# Patient Record
Sex: Female | Born: 1950
Health system: Southern US, Community
[De-identification: ages and names within clinical notes are randomized; demographics above are authoritative.]

## PROBLEM LIST (undated history)

## (undated) DIAGNOSIS — M8580 Other specified disorders of bone density and structure, unspecified site: Secondary | ICD-10-CM

## (undated) DIAGNOSIS — M1990 Unspecified osteoarthritis, unspecified site: Secondary | ICD-10-CM

## (undated) DIAGNOSIS — T7840XA Allergy, unspecified, initial encounter: Secondary | ICD-10-CM

## (undated) DIAGNOSIS — H409 Unspecified glaucoma: Secondary | ICD-10-CM

## (undated) DIAGNOSIS — F329 Major depressive disorder, single episode, unspecified: Secondary | ICD-10-CM

## (undated) DIAGNOSIS — M26629 Arthralgia of temporomandibular joint, unspecified side: Secondary | ICD-10-CM

## (undated) DIAGNOSIS — Z8049 Family history of malignant neoplasm of other genital organs: Secondary | ICD-10-CM

## (undated) DIAGNOSIS — U071 COVID-19: Secondary | ICD-10-CM

## (undated) DIAGNOSIS — I10 Essential (primary) hypertension: Secondary | ICD-10-CM

## (undated) DIAGNOSIS — F32A Depression, unspecified: Secondary | ICD-10-CM

## (undated) DIAGNOSIS — E785 Hyperlipidemia, unspecified: Secondary | ICD-10-CM

## (undated) DIAGNOSIS — Z800 Family history of malignant neoplasm of digestive organs: Secondary | ICD-10-CM

## (undated) DIAGNOSIS — Z801 Family history of malignant neoplasm of trachea, bronchus and lung: Secondary | ICD-10-CM

## (undated) HISTORY — DX: Allergy, unspecified, initial encounter: T78.40XA

## (undated) HISTORY — DX: Arthralgia of temporomandibular joint, unspecified side: M26.629

## (undated) HISTORY — DX: Family history of malignant neoplasm of other genital organs: Z80.49

## (undated) HISTORY — DX: Unspecified glaucoma: H40.9

## (undated) HISTORY — DX: Other specified disorders of bone density and structure, unspecified site: M85.80

## (undated) HISTORY — DX: Family history of malignant neoplasm of digestive organs: Z80.0

## (undated) HISTORY — PX: KNEE ARTHROSCOPY: SUR90

## (undated) HISTORY — DX: Essential (primary) hypertension: I10

## (undated) HISTORY — DX: Major depressive disorder, single episode, unspecified: F32.9

## (undated) HISTORY — PX: BIOPSY BREAST: PRO8

## (undated) HISTORY — DX: Hyperlipidemia, unspecified: E78.5

## (undated) HISTORY — PX: TONSILLECTOMY: SUR1361

## (undated) HISTORY — DX: Unspecified osteoarthritis, unspecified site: M19.90

## (undated) HISTORY — PX: TEMPOROMANDIBULAR JOINT SURGERY: SHX35

## (undated) HISTORY — DX: Depression, unspecified: F32.A

## (undated) HISTORY — DX: Family history of malignant neoplasm of trachea, bronchus and lung: Z80.1

## (undated) HISTORY — PX: BLEPHAROPLASTY: SUR158

## (undated) HISTORY — DX: COVID-19: U07.1

---

## 1999-08-08 ENCOUNTER — Other Ambulatory Visit: Admission: RE | Admit: 1999-08-08 | Discharge: 1999-08-08 | Payer: Self-pay | Admitting: Obstetrics and Gynecology

## 2000-04-02 ENCOUNTER — Encounter: Payer: Self-pay | Admitting: Neurological Surgery

## 2000-04-02 ENCOUNTER — Encounter: Admission: RE | Admit: 2000-04-02 | Discharge: 2000-04-02 | Payer: Self-pay | Admitting: Neurological Surgery

## 2000-09-03 ENCOUNTER — Other Ambulatory Visit: Admission: RE | Admit: 2000-09-03 | Discharge: 2000-09-03 | Payer: Self-pay | Admitting: Obstetrics and Gynecology

## 2001-09-07 ENCOUNTER — Other Ambulatory Visit: Admission: RE | Admit: 2001-09-07 | Discharge: 2001-09-07 | Payer: Self-pay | Admitting: Obstetrics and Gynecology

## 2002-09-13 ENCOUNTER — Other Ambulatory Visit: Admission: RE | Admit: 2002-09-13 | Discharge: 2002-09-13 | Payer: Self-pay | Admitting: Obstetrics and Gynecology

## 2003-08-01 ENCOUNTER — Encounter: Payer: Self-pay | Admitting: Oral & Maxillofacial Surgery

## 2003-08-03 ENCOUNTER — Observation Stay (HOSPITAL_COMMUNITY): Admission: RE | Admit: 2003-08-03 | Discharge: 2003-08-04 | Payer: Self-pay | Admitting: Unknown Physician Specialty

## 2003-11-30 ENCOUNTER — Other Ambulatory Visit: Admission: RE | Admit: 2003-11-30 | Discharge: 2003-11-30 | Payer: Self-pay | Admitting: Obstetrics and Gynecology

## 2004-12-04 ENCOUNTER — Other Ambulatory Visit: Admission: RE | Admit: 2004-12-04 | Discharge: 2004-12-04 | Payer: Self-pay | Admitting: Obstetrics and Gynecology

## 2005-12-09 ENCOUNTER — Other Ambulatory Visit: Admission: RE | Admit: 2005-12-09 | Discharge: 2005-12-09 | Payer: Self-pay | Admitting: Obstetrics and Gynecology

## 2007-03-15 ENCOUNTER — Encounter: Admission: RE | Admit: 2007-03-15 | Discharge: 2007-03-15 | Payer: Self-pay | Admitting: Orthopedic Surgery

## 2007-10-08 ENCOUNTER — Ambulatory Visit: Payer: Self-pay | Admitting: Family Medicine

## 2008-05-01 ENCOUNTER — Ambulatory Visit: Payer: Self-pay | Admitting: Family Medicine

## 2008-05-01 ENCOUNTER — Encounter: Admission: RE | Admit: 2008-05-01 | Discharge: 2008-05-01 | Payer: Self-pay | Admitting: Family Medicine

## 2008-09-22 LAB — HM COLONOSCOPY: HM Colonoscopy: NORMAL

## 2008-10-04 ENCOUNTER — Ambulatory Visit: Payer: Self-pay | Admitting: Family Medicine

## 2009-01-01 LAB — HM DEXA SCAN: HM Dexa Scan: NORMAL

## 2010-08-27 ENCOUNTER — Ambulatory Visit: Payer: Self-pay | Admitting: Family Medicine

## 2010-11-06 ENCOUNTER — Ambulatory Visit: Payer: Self-pay | Admitting: Family Medicine

## 2011-04-04 NOTE — Op Note (Signed)
NAME:  Jodi Duffy, Jodi Duffy                       ACCOUNT NO.:  [id number]   MEDICAL RECORD NO.:  [id number]                   PATIENT TYPE:  OBV   LOCATION:  0445                                 FACILITY:  [facility]   PHYSICIAN:  Dorthula Matas, D.D.S.           DATE OF BIRTH:  [birth year]   DATE OF PROCEDURE:  08/03/2003  DATE OF DISCHARGE:  08/04/2003                                 OPERATIVE REPORT   This is a redictation of her operative note.   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   OPERATION:  Bilateral mandibular sagittal split osteotomies with advancement  of the mandible and rigid interosseous fixation and mandibular horizontal  sliding reduction symphyseal osteotomy with rigid fixation.   SURGEON:  Dorthula Matas, D.D.S. and Gwendlyn Deutscher, D.D.S.   ESTIMATED BLOOD LOSS:  300 mL   DESCRIPTION OF PROCEDURE:  The patient was brought to the OR, placed on the  OR table in a supine position. She was then placed under general anesthesia  and a nasal endotracheal tube was inserted. The patient was maintained under  general anesthesia and the nasogastric tube was also inserted. The patient  was prepped and draped in a sterile manner for an oral maxillofacial surgery  procedure. Local anesthetic was administered in the symphysis and  parasymphysis areas as well as in the right and left inferior alveolar nerve  areas and along the lateral aspect of the posterior body of the mandible.  Our attention was first directed to the symphyseal area and the patient was  placed in intermaxillary fixation. A 15 blade was used to make an incision  in the unattached mucosa starting at facial tooth #22 and extending into the  inner lip mucosa and inflating the incision facially at tooth #27 in the  unattached mucosa. The incision was taken  down through mentalis musculature  and then the periosteum of the symphysis was incised. The soft tissue was  reflected until the mental foramen was identified bilaterally and the mental  foramen were protected. At this point, a vertical marking line was made in  the midline and paramidline areas of the symphysis. Next a reciprocating saw  was used to make a horizontal osteotomy cut of the symphysis. This was  completed and the inferior aspect of the symphysis osteotomy was  repositioned posteriorly and was stabilized with a mandibular bone plate. At  this point, our attention was directed to the ramus areas of the mandible.  An incision was made with a 15 blade overlying the ascending ramus. This  incision was an unattached mucosa and went down through periosteum and then  was taken laterally into the lateral buccal vestibule of the mandible in the  unattached mucosa. This was done using the Bovie cautery in the cutting mode  and also with a 15 blade. This  was first done on the left side. The soft  tissue was reflected so that the ascending ramus could be identified and  also to reflect a tunnel along the medial aspect of the mandible so that the  lingula could be identified as well as a vertical tunnel along the lateral  aspect of the posterior body of the mandible. At this point, a Lindeman end  bur was used to make the medial horizontal osteotomy cut through the medial  cortical plate of bone of the ramus just above the lingula. A 71 fissure bur  was used to make the sagittal osteotomy cut along the ascending aspect of  the mandibular ramus and along the horizontal aspect of the posterior body  of the mandible. Next a vertical osteotomy was completed at the most  anterior extent of the sagittal cut and this went from the inferior border  of the mandible through the lateral plate up to the most anterior extent of  the sagittal osteotomy cut. At this point, small osteotomes were used to   begin the sagittal __________ process and finally the Smith osteotomes were  used to complete the sagittal split on the left side. The inferior alveolar  nerve was noted to be intact and was located in the distal segment of the  mandible. At this point, a half-bite was placed and attention was directed  to the right side where a similar incision, dissection and osteotomy cuts  were completed. The sagittal split osteotomy was also completed in a similar  fashion. At this point, a sling stripper was used to release the muscle  attachments from the posterior aspect of the distal segment bilaterally. The  patient had the half-bite removed on the left side and was placed into the  surgical splint and was stabilized in the splint with multiple  intermaxillary fixation, 26 gauge wire loops. At this point, the proximal  segments of the mandible were positioned superiorly and slightly posteriorly  in the glenoid process so that the proximal segments were seated correctly  and an appropriate gap was made between the distal and proximal segments of  the lateral cortical plates. A bone plate spanning this area was placed and  was contoured to fit passively. Appropriate size 2 mm screws were placed to  stabilize proximal and distal segments. This was done bilaterally. At this  point, the surgical site was copiously irrigated with normal saline,  suctioned free of debris. The intermaxillary fixation was released and the  closure was checked and noted to be as planned on the surgical models. At  this stage, the patient was placed back into intermaxillary fixation and the  sagittal split incisions were closed primarily with 4-0 Vicryl suture. The  mandibular incision in the symphysis area was also closed and it was closed  in layers using 3-0 gut in the deep layer and 4-0 Vicryl suture placed in  the mucosal layer to provide proper closure. At this point, the intermaxillary fixation was released and the  patient was placed in  intermaxillary fixation elastic and also a compression dressing was placed  over the symphysis area using Elastoplast dressing. The patient was awakened  in the operating room and was transferred to the PACU area where she was  eventually extubated and was found to be in satisfactory postoperative  condition.  Dorthula Matas, D.D.S.    SWS/MEDQ  D:  10/11/2003  T:  10/11/2003  Job:  [id number]

## 2011-04-04 NOTE — Op Note (Signed)
NAME:  Jodi Duffy, Jodi Duffy                      ACCOUNT NO.:  [id number]   MEDICAL RECORD NO.:  [id number]                   PATIENT TYPE:  OBV   LOCATION:                                       FACILITY:  [facility]   PHYSICIAN:  Dorthula Matas, D.D.S.           DATE OF BIRTH:  1950/12/12   DATE OF PROCEDURE:  DATE OF DISCHARGE:                                 OPERATIVE REPORT   PREOPERATIVE DIAGNOSES:  1. Mandibular progenia.  2. Mandibular retrognathia.   POSTOPERATIVE DIAGNOSES:  1. Mandibular progenia.  2. Mandibular retrognathia.   OPERATION:  1. Bilateral mandibular sagittal split osteotomies with advancement and     rotation.  2. Horizontal mandibular symphysis osteotomy for reduction.  3. Bone plate fixation.   SURGEONS:  Dorthula Matas, D.D.S., and Gwendlyn Deutscher, D.D.S.   ANESTHESIA:  General anesthesia via nasoendotracheal tube.   DRAINS:  Nasogastric tube and Foley catheter.   SPECIMENS:  None.   COMPLICATIONS:  None.   CULTURES:  None.   DESCRIPTION OF PROCEDURE:  The patient was brought to the OR and placed on  the OR table in supine position.  She was then placed under general  anesthesia and nasotracheal and tube and nasogastric tube were inserted.  The patient was prepped and draped in a sterile manner for an oral and  maxillofacial surgical procedure and a moist throat pack was placed.  Marcaine 0.5% was used to give inferior alveolar nerve blocks bilaterally  and also to infiltrate along the anterior aspect of the ascending mandibular  ramus and also on the lateral aspect of the posterior body of the mandible.  A total of 7.2 mL were given in the posterior mandible.  In the mandibular  symphysis area 3.6 mL of 0.5% Marcaine with 1:200,000 epinephrine were  administered.  Once this was done the patient was placed in intermaxillary  fixation.  This was done with 26-gauge wire loops.  An incision was then  made in the lower lip vestibule with starting  the unattached mucosa facial  to tooth #22 and extended out into the midline of the inner aspect of the  lower lip and, in fact, to the unattached mucosa of the vestibule facial to  tooth #27.  This incision was deepened and taken through mentalis  musculature and periosteum.  The periosteal elevator was used to reflect the  periosteum from the symphysis and parasymphysis regions.  The mental nerve  and the mental foramen were identified bilaterally and were protected.  Also, vertical midline bone markers were done as well as paramidline using a  701 Fisher bur.  At this point a reciprocating saw was used to make the  osteotomy cut from inferior to the mandibular foramen on the right or just  anterior to that to inferior to and just anterior to the mental foramen on  the left.  Once this osteotomy was completed, the osteotomized segment of  the  symphysis was moved posteriorly approximately 5 mm and stabilized with a  KLS 5 mm bone plate.  Six 2 mm size KLS screws were placed to secure the  mobilized section of the symphysis to the remaining portion of the symphysis  which had not been mobilized.  At this point our attention was directed to  the sagittal split areas.  The Bovie cautery in a cutting mode was used to  make incision of the unattached mucosa overlying the ascending ramus of the  mandible and then extending from there into the posterior mandibular  vestibule.  This was cut, was taken down to periosteum, and a 15 blade was  used to incise the periosteum.  A periosteal elevator was then used to  reflect a vertical tunnel along the lateral aspect of the posterior body of  the mandible and also to reflect the tissue from along the ascending aspect  of the mandibular ramus, and then finally to create a medial tunnel along  the medial aspect of the ascending ramus.  The lingula was identified as  well as the inferior alveolar nerve, which passed into the lingula.  This  was reflected  and protected using a Pension scheme manager.  At this point a  Lindemann burr and a Librarian, academic were used to make the horizontal ramus  cut along the medial aspect of the mandibular ramus above the lingula but  below the sigmoid notch.  This cut was through the medial cortical plate  into the bleeding medullary bone.  A 701 Fisher bur was then used to make  the sagittal osteotomy cut from the most anterior aspect of the medial  horizontal cut down along the ascending ramus.  A 703 Fisher bur was used to  make a vertical osteotomy cut from the inferior aspect of the mandible up to  the most anterior aspect of the sagittal cut.  Once this was completed,  small osteotomes were used to begin the sagittal splitting process.  Increasing-size osteotomes were used until finally the [employer] were used to complete the sagittal split on the left side.  The  inferior alveolar nerve was noted to be totally in the distal segment of the  mandible.  At this point a J-stripper was used to release the musculature  along the inferior aspect of the distal segment posteriorly.  At this point  a half pack was placed and our attention was directed to the right side,  where similar incisions, dissection, and osteotomy cuts were completed on  the right side.  Again the sagittal splitting process went smoothly on the  right side and a J-stripper was used to release the muscle attached from the  posterior aspect of the distal segment.  At this point the small pack which  had been placed on the left side was removed and the mandible was advanced.  The splint was placed between the maxillary and mandibular teeth and  multiple intermaxillary fixation wire loops were placed and secured.  This  held the mandible into its new position nicely.  Using a two-hole technique,  the ramus of the mandible was positioned so that the condyle would be seated securely in the glenoid fossa.  This allowed approximately  a 4-5 mm  advancement on each side.  There was some rotation and some bony  modification that needed to be made along the posterior aspect of the distal  segment.  This was done with an alveoloplasty bur.  The surgical sites were  then copiously irrigated.  The ramus was positioned appropriately and a six-  hole KLS mandibular bone plate was placed to span the osteotomy site.  Three  2 mm diameter screws were placed in the proximal segment and two 2 mm  diameter screws were placed in the distal segment.  This secured the bone  plate to the mandible and also secured the osteotomy site.  At this point a  similar procedure was carried out on the right side.  Once this was done,  intermaxillary fixation was released and occlusion was checked.  The  occlusion appeared to be as planned on the surgical models.  The patient was  placed back into an intermaxillary fixation splint and a single interosseous  screw was placed through the proximal and distal segment close to the  superior border on each side to provide improved stabilization.  This was  done because the patient was noted to be a clencher or bruxer and I wanted  to try to provide as much stabilization as I could to the bundle segments.  At this point the surgical site was copiously irrigated with warm saline.  The sagittal split incisions were closed with 4-0 Vicryl suture was placed  in a running continuous fashion.  The symphysis incision was closed deep  with 3-0 gut suture and then 4-0 Vicryl sutures placed for primary closure.  At this point the intermaxillary fixation had been released and the  occlusion was rechecked.  The splint was tied to the split maxilla using two  28-gauge wires, which were passed in the premolar area and around the  premolar brackets of the orthodontic appliances.  The oral cavity was  copiously irrigated with normal saline and suctioned free of debris.  The  throat pack was removed.  The oropharynx was  suctioned.  The patient was  placed into the splint using multiple elastics to provide traction.  At  this stage the face was washed and dried and a chin dressing was placed  using an Elastoplast chin dressing and silk tape.  The patient tolerated the  procedure well and was later transferred from the operating room to the  recovery area.                                                Dorthula Matas, D.D.S.    SWS/MEDQ  D:  08/03/2003  T:  08/04/2003  Job:  [id number]

## 2011-04-04 NOTE — H&P (Signed)
NAME:  Jodi, Duffy                       ACCOUNT NO.:  [id number]   MEDICAL RECORD NO.:  [id number]                   PATIENT TYPE:  AMB   LOCATION:  DAY                                  FACILITY:  [facility]   PHYSICIAN:  Dorthula Matas, D.D.S.           DATE OF BIRTH:  23-Jun-1951   DATE OF ADMISSION:  08/03/2003  DATE OF DISCHARGE:                                HISTORY & PHYSICAL   HISTORY OF PRESENT ILLNESS:  Jodi Duffy presents today to the hospital  at [facility] for surgical care.  She is a 60 year old female who  is well known to me.  She had previously seen J. Kristen Cardinal, D.D.S.  for an orthognathic assessment and temporomandibular joint work-up.  Through  the combination of the diagnostic work-up it was decided for the patient to  not only have splint care, but eventually orthodontics and orthognathic  surgery.  Considerations have been made for maxillary advancement and  mandibular advancement with possible symphysis reduction versus mandibular  surgery advanced with symphysis reduction.  After [name],  D.D.S. returned, I took over the patient's case and I met with patient a  couple times.  We went back through a work-up and also met with her general  dentist and orthodontist to discuss possible solutions to her skeletal  dysplasia.  After much consideration all of the doctors involved and the  patient have decided that mandibular __________ osteotomies for advancement  and mandibular symphysis reduction would provide her an improved occlusal  situation and help address her temporomandibular joint needs as well as  functional needs.  I have gone through the consultation in detail and  discussed with her the risks as well as benefits of proposed procedure.  She  is aware that there are multiple risks which include, but are not limited,  to the following:  Swelling, bruising, bleeding, need for further surgical  care and orthodontic care  and general dental restorations, malunion or  nonunion of bony segments, infection, numbness to the lower lip, chip,  and/or tongue which may be temporary or permanent, possible continued  temporomandibular bones which may require further therapy and possibly even  surgery, facial profile changes, possible trismus or limited mouth opening  which may be temporary or long-term, intraoral scarring, possible loss of  soft tissue, bone, and/or teeth, or possible need for root canal therapy.  The patient understands the above and has not only seen the surgical models,  but also has viewed the computerized program concerning facial changes.  She  also has seen an orthognathic skull to demonstrate where the surgical care  will be completed.  The patient's history and physical has been reviewed and  she is a good surgical risk.  Dorthula Matas, D.D.S.    SWS/MEDQ  D:  08/03/2003  T:  08/03/2003  Job:  [id number]

## 2011-04-29 ENCOUNTER — Other Ambulatory Visit: Payer: Self-pay | Admitting: Otolaryngology

## 2011-04-29 DIAGNOSIS — R900 Intracranial space-occupying lesion found on diagnostic imaging of central nervous system: Secondary | ICD-10-CM

## 2011-04-29 DIAGNOSIS — H7090 Unspecified mastoiditis, unspecified ear: Secondary | ICD-10-CM

## 2011-05-05 ENCOUNTER — Ambulatory Visit
Admission: RE | Admit: 2011-05-05 | Discharge: 2011-05-05 | Disposition: A | Discharge status: Home / Self Care | Payer: BC Managed Care – PPO | Source: Ambulatory Visit | Attending: Otolaryngology | Admitting: Otolaryngology

## 2011-05-05 DIAGNOSIS — H7090 Unspecified mastoiditis, unspecified ear: Secondary | ICD-10-CM

## 2011-05-05 DIAGNOSIS — R900 Intracranial space-occupying lesion found on diagnostic imaging of central nervous system: Secondary | ICD-10-CM

## 2011-05-05 MED ORDER — IOHEXOL 300 MG/ML  SOLN
75.0000 mL | Freq: Once | INTRAMUSCULAR | Status: AC | PRN
  Administered 2011-05-05: 75 mL via INTRAVENOUS

## 2011-06-06 LAB — HM DEXA SCAN: HM Dexa Scan: NORMAL

## 2011-08-29 ENCOUNTER — Other Ambulatory Visit: Payer: Self-pay | Admitting: Orthopaedic Surgery

## 2011-08-29 DIAGNOSIS — M25561 Pain in right knee: Secondary | ICD-10-CM

## 2011-09-02 ENCOUNTER — Ambulatory Visit
Admission: RE | Admit: 2011-09-02 | Discharge: 2011-09-02 | Disposition: A | Discharge status: Home / Self Care | Payer: BC Managed Care – PPO | Source: Ambulatory Visit | Attending: Orthopaedic Surgery | Admitting: Orthopaedic Surgery

## 2011-09-02 DIAGNOSIS — M25561 Pain in right knee: Secondary | ICD-10-CM

## 2011-10-06 ENCOUNTER — Encounter: Payer: Self-pay | Admitting: Family Medicine

## 2011-10-07 ENCOUNTER — Encounter: Payer: Self-pay | Admitting: Medical

## 2011-10-07 ENCOUNTER — Ambulatory Visit (INDEPENDENT_AMBULATORY_CARE_PROVIDER_SITE_OTHER): Payer: BC Managed Care – PPO | Admitting: Medical

## 2011-10-07 VITALS — BP 110/70 | HR 60 | Temp 97.7°F | Resp 16 | Wt 146.0 lb

## 2011-10-07 DIAGNOSIS — R82998 Other abnormal findings in urine: Secondary | ICD-10-CM

## 2011-10-07 DIAGNOSIS — F32A Depression, unspecified: Secondary | ICD-10-CM | POA: Insufficient documentation

## 2011-10-07 DIAGNOSIS — F329 Major depressive disorder, single episode, unspecified: Secondary | ICD-10-CM

## 2011-10-07 DIAGNOSIS — Z0000 Encounter for general adult medical examination without abnormal findings: Secondary | ICD-10-CM

## 2011-10-07 LAB — POCT URINALYSIS DIPSTICK
Leukocytes, UA: POSITIVE
Nitrite, UA: NEGATIVE
Protein, UA: NEGATIVE
Urobilinogen, UA: NEGATIVE
pH, UA: 8

## 2011-10-07 MED ORDER — VENLAFAXINE HCL ER 75 MG PO CP24: 75.0000 mg | ORAL_CAPSULE | Freq: Every day | ORAL | Status: DC

## 2011-10-07 NOTE — Progress Notes (Signed)
Addended by: Jac Canavan on: 10/07/2011 10:25 AM   Modules accepted: Orders

## 2011-10-07 NOTE — Progress Notes (Signed)
Subjective:   HPI  Jodi Duffy is a 60 y.o. female who presents for routine follow up.   Her last visit here in 12/11. She just had knee surgery on her right knee for arthroscopy and ended up having torn meniscus, arthritis, and some floating debris that was cleaned out - Dr. Cleophas Dunker.  Her eye doctor saw some changes in her eye yesterday concerning for high cholesterol.   Goes back to eye doctor next week.  In general up until the knee surgery, was exercising regularly with golf, has a trainer, and does Pilates.  She notes that she eats healthy. She is an Pensions consultant.   No other aggravating or relieving factors.    Last colonoscopy with Dr. Kinnie Janaisha Tolsma within last few years.   Last mammogram 9/12.  Last pap smear UTD through Dr. Duane Lope.  Last Bone Density scan 2010.  No other c/o.  The following portions of the patient's history were reviewed and updated as appropriate: allergies, current medications, past family history, past medical history, past social history, past surgical history and problem list.   Past Medical History  Diagnosis Date  . Allergy     RHINITIS  . Depression   . TMJ syndrome   . Osteopenia   . Arthritis     Past Surgical History  Procedure Date  . Joint replacement     RIGHT KNEE  . Knee arthroscopy     right knee, 1990 and 2012, Dr. Cleophas Dunker    Family History  Problem Relation Age of Onset  . Heart disease Mother     valve replacement  . Cancer Mother     breast, throat  . Heart disease Father     died of brain embolism after hip surgery  . Arthritis Father   . Arthritis Brother   . Cancer Maternal Aunt     various cancers  . Diabetes Neg Hx   . Hypertension Neg Hx   . Stroke Neg Hx     History   Social History  . Marital Status: Married    Spouse Name: N/A    Number of Children: N/A  . Years of Education: N/A   Occupational History  . attorney    Social History Main Topics  . Smoking status: Never Smoker   . Smokeless tobacco:  Not on file  . Alcohol Use: No  . Drug Use: No  . Sexually Active: Not on file   Other Topics Concern  . Not on file   Social History Narrative   Exercises with pilates, golf, and has a trainer    Current Outpatient Prescriptions on File Prior to Visit  Medication Sig Dispense Refill  . cetirizine-pseudoephedrine (ZYRTEC-D) 5-120 MG per tablet Take 1 tablet by mouth daily.        Marland Kitchen venlafaxine (EFFEXOR-XR) 75 MG 24 hr capsule Take 75 mg by mouth daily.        . cholecalciferol (VITAMIN D) 1000 UNITS tablet Take 1,000 Units by mouth daily.        . fish oil-omega-3 fatty acids 1000 MG capsule Take 2 g by mouth daily.          Allergies  Allergen Reactions  . Penicillins Hives   Review of Systems Constitutional: -fever, -chills, -sweats, -unexpected -weight change,-fatigue ENT: -runny nose, -ear pain, -sore throat Cardiology:  -chest pain, -palpitations, -edema Respiratory: -cough, -shortness of breath, -wheezing Gastroenterology: -abdominal pain, -nausea, -vomiting, -diarrhea, -constipation Hematology: -bleeding or bruising problems Musculoskeletal: -arthralgias, -myalgias, +joint swelling, -  back pain Ophthalmology: -vision changes Urology: -dysuria, -difficulty urinating, -hematuria, -urinary frequency, -urgency Neurology: -headache, -weakness, -tingling, -numbness     Objective:   Physical Exam  Filed Vitals:   10/07/11 0835  BP: 110/70  Pulse: 60  Temp: 97.7 F (36.5 C)  Resp: 16    General appearance: alert, no distress, WD/WN, female, wearing glasses Skin: unremarkable HEENT: normocephalic, conjunctiva/corneas normal, sclerae anicteric, PERRLA, EOMi, nares patent, no discharge or erythema, pharynx normal Oral cavity: MMM, tongue normal, teeth in good repair Neck: supple, no lymphadenopathy, no thyromegaly, no masses, normal ROM, no bruits Chest: non tender, normal shape and expansion Heart: RRR, normal S1, S2, no murmurs Lungs: CTA bilaterally, no wheezes,  rhonchi, or rales Abdomen: +bs, soft, non tender, non distended, no masses, no hepatomegaly, no splenomegaly, no bruits Back: non tender, normal ROM, no scoliosis Musculoskeletal: right knee tender from surgery last week, not examined, otherwise upper extremities non tender, no obvious deformity, normal ROM throughout, lower extremities non tender, no obvious deformity, normal ROM throughout Extremities: no edema, no cyanosis, no clubbing Pulses: 2+ symmetric, upper and lower extremities, normal cap refill Neurological: alert, oriented x 3, CN2-12 intact, strength normal upper extremities and lower extremities, sensation normal throughout, DTRs 2+ throughout, no cerebellar signs, gait normal Psychiatric: normal affect, behavior normal, pleasant  Breast/gyn/rectal - deferred to gynecology   Assessment and Plan :    Encounter Diagnoses  Name Primary?  . Health supervision of infant or child Yes  . Depression    Physical exam - discussed healthy lifestyle, diet, exercise, preventative care, vaccinations, and addressed their concerns.  Handout given. Cholesterol was elevated last year.   Advised that I would consider adding medication if LDL 100 or higher, particular if 130 or higher. Risk factors are family history and age.  Will call with lab results.    She is UTD on pap, mammogram through gynecology, will request copy of last colonoscopy through Dr. Kinnie Charlesetta Milliron, and bone density 12/2008 normal.  She is UTD on Zostavax 10/11, Tdap 09/2008, and recently had flu vaccine.  Depression - doing ok on present medication, c/t same medication.   Follow-up pending labs.

## 2011-10-07 NOTE — Progress Notes (Deleted)
  Subjective:    Patient ID: Jodi Duffy, female    DOB: [birth year], 60 y.o.   MRN: [id number]  HPI    Review of Systems     Objective:   Physical Exam        Assessment & Plan:

## 2011-10-08 LAB — CBC WITH DIFFERENTIAL/PLATELET
Basophils Absolute: 0 10*3/uL (ref 0.0–0.1)
Eosinophils Relative: 3 % (ref 0–5)
HCT: 39 % (ref 36.0–46.0)
Hemoglobin: 12.7 g/dL (ref 12.0–15.0)
Lymphocytes Relative: 26 % (ref 12–46)
MCV: 92.6 fL (ref 78.0–100.0)
Monocytes Absolute: 0.3 10*3/uL (ref 0.1–1.0)
Monocytes Relative: 6 % (ref 3–12)
Neutro Abs: 2.9 10*3/uL (ref 1.7–7.7)
RDW: 14.7 % (ref 11.5–15.5)
WBC: 4.5 10*3/uL (ref 4.0–10.5)

## 2011-10-08 LAB — LIPID PANEL
HDL: 54 mg/dL (ref >39)
LDL Cholesterol: 136 mg/dL — ABNORMAL HIGH (ref 0–99)
Total CHOL/HDL Ratio: 4.3 Ratio
Triglycerides: 203 mg/dL — ABNORMAL HIGH (ref <150)
VLDL: 41 mg/dL — ABNORMAL HIGH (ref 0–40)

## 2011-10-08 LAB — COMPREHENSIVE METABOLIC PANEL
ALT: 20 U/L (ref 0–35)
AST: 18 U/L (ref 0–37)
BUN: 18 mg/dL (ref 6–23)
CO2: 27 mEq/L (ref 19–32)
Calcium: 9.1 mg/dL (ref 8.4–10.5)
Chloride: 103 mEq/L (ref 96–112)
Creat: 0.55 mg/dL (ref 0.50–1.10)
Total Bilirubin: 0.3 mg/dL (ref 0.3–1.2)

## 2011-10-10 ENCOUNTER — Other Ambulatory Visit: Payer: Self-pay | Admitting: Medical

## 2011-10-10 LAB — URINE CULTURE

## 2011-10-10 MED ORDER — NITROFURANTOIN MONOHYD MACRO 100 MG PO CAPS: 100.0000 mg | ORAL_CAPSULE | Freq: Two times a day (BID) | ORAL | Status: AC

## 2011-10-10 MED ORDER — ATORVASTATIN CALCIUM 40 MG PO TABS: 40.0000 mg | ORAL_TABLET | Freq: Every day | ORAL | Status: DC

## 2011-10-13 ENCOUNTER — Other Ambulatory Visit: Payer: Self-pay | Admitting: Medical

## 2011-10-13 MED ORDER — ATORVASTATIN CALCIUM 10 MG PO TABS: 10.0000 mg | ORAL_TABLET | Freq: Every day | ORAL | Status: DC

## 2011-10-28 ENCOUNTER — Other Ambulatory Visit: Payer: BC Managed Care – PPO

## 2011-10-30 ENCOUNTER — Other Ambulatory Visit (INDEPENDENT_AMBULATORY_CARE_PROVIDER_SITE_OTHER): Payer: BC Managed Care – PPO

## 2011-10-30 DIAGNOSIS — N390 Urinary tract infection, site not specified: Secondary | ICD-10-CM

## 2011-11-05 LAB — POCT URINALYSIS DIPSTICK
Bilirubin, UA: NEGATIVE
Blood, UA: NEGATIVE
Glucose, UA: NEGATIVE
Ketones, UA: NEGATIVE
Nitrite, UA: NEGATIVE
pH, UA: 7.5

## 2011-11-07 ENCOUNTER — Telehealth: Payer: Self-pay | Admitting: Family Medicine

## 2011-11-07 NOTE — Telephone Encounter (Signed)
Message copied by Janeice Robinson on Fri Nov 07, 2011 10:48 AM ------      Message from: Aleen Campi, DAVID S      Created: Thu Nov 06, 2011  6:56 AM       Repeat urine was fine.

## 2011-11-07 NOTE — Telephone Encounter (Signed)
PATIENT WAS NOTIFIED OF HER UA SAMPLE WAS NORMAL. CLS

## 2012-02-06 ENCOUNTER — Telehealth: Payer: Self-pay | Admitting: Internal Medicine

## 2012-02-06 MED ORDER — ATORVASTATIN CALCIUM 10 MG PO TABS: 10.0000 mg | ORAL_TABLET | Freq: Every day | ORAL | Status: DC

## 2012-02-06 NOTE — Telephone Encounter (Signed)
Done

## 2012-04-19 ENCOUNTER — Telehealth: Payer: Self-pay | Admitting: Family Medicine

## 2012-04-19 NOTE — Telephone Encounter (Signed)
I SPOKE WITH THE PATIENT AND SHE STATES THAT SHE IS GOING BACK AND FORTH ABOUT THE SURGERY. SHE STATES THAT SHE WILL MEET WITH ORTHO AGAIN ON Monday AND SHE WILL DECIDED AFTER THAT. ONCE SHE DECIDES SHE WILL SCHEDULE HER OFFICE VISIT. CLS

## 2012-04-19 NOTE — Telephone Encounter (Signed)
Message copied by Janeice Robinson on Mon Apr 19, 2012  4:42 PM ------      Message from: Jac Canavan      Created: Mon Apr 19, 2012  2:48 PM       i received clearance letter from orthopedic.   She is apparently having total knee replacement.  She is due back for cholesterol check since going on medication in November (see prior msg).             Lets have her come in fasting for office visit to recheck lipids, EKG for cardiac screening, and fasting labs.

## 2012-05-13 ENCOUNTER — Telehealth: Payer: Self-pay | Admitting: Internal Medicine

## 2012-05-13 MED ORDER — ATORVASTATIN CALCIUM 10 MG PO TABS: 10.0000 mg | ORAL_TABLET | Freq: Every day | ORAL | Status: DC

## 2012-05-13 NOTE — Telephone Encounter (Signed)
Pt is scheduled for a med check July 2 at 3:15pm and will run out of Lipitor 10mg  Saturday 6/29 so i sent in a 30 day supply to [facility] on [address] so pt would get her to her appt til then

## 2012-05-18 ENCOUNTER — Ambulatory Visit (INDEPENDENT_AMBULATORY_CARE_PROVIDER_SITE_OTHER): Payer: BC Managed Care – PPO | Admitting: Family Medicine

## 2012-05-18 ENCOUNTER — Encounter: Payer: Self-pay | Admitting: Family Medicine

## 2012-05-18 VITALS — BP 118/70 | HR 84 | Wt 147.0 lb

## 2012-05-18 DIAGNOSIS — E785 Hyperlipidemia, unspecified: Secondary | ICD-10-CM

## 2012-05-18 DIAGNOSIS — M1990 Unspecified osteoarthritis, unspecified site: Secondary | ICD-10-CM

## 2012-05-18 DIAGNOSIS — J309 Allergic rhinitis, unspecified: Secondary | ICD-10-CM

## 2012-05-18 DIAGNOSIS — Z79899 Other long term (current) drug therapy: Secondary | ICD-10-CM

## 2012-05-18 DIAGNOSIS — M129 Arthropathy, unspecified: Secondary | ICD-10-CM

## 2012-05-18 DIAGNOSIS — F341 Dysthymic disorder: Secondary | ICD-10-CM

## 2012-05-18 DIAGNOSIS — J302 Other seasonal allergic rhinitis: Secondary | ICD-10-CM

## 2012-05-18 LAB — COMPREHENSIVE METABOLIC PANEL
Albumin: 4.5 g/dL (ref 3.5–5.2)
BUN: 18 mg/dL (ref 6–23)
CO2: 27 mEq/L (ref 19–32)
Glucose, Bld: 96 mg/dL (ref 70–99)
Potassium: 4.5 mEq/L (ref 3.5–5.3)
Sodium: 137 mEq/L (ref 135–145)
Total Protein: 6.8 g/dL (ref 6.0–8.3)

## 2012-05-18 LAB — CBC WITH DIFFERENTIAL/PLATELET
Eosinophils Absolute: 0.1 10*3/uL (ref 0.0–0.7)
HCT: 38.6 % (ref 36.0–46.0)
Hemoglobin: 13.5 g/dL (ref 12.0–15.0)
Lymphs Abs: 1.9 10*3/uL (ref 0.7–4.0)
MCH: 31.2 pg (ref 26.0–34.0)
Monocytes Absolute: 0.5 10*3/uL (ref 0.1–1.0)
Monocytes Relative: 7 % (ref 3–12)
Neutro Abs: 4.1 10*3/uL (ref 1.7–7.7)
Neutrophils Relative %: 62 % (ref 43–77)
RBC: 4.33 MIL/uL (ref 3.87–5.11)

## 2012-05-18 LAB — LIPID PANEL
Cholesterol: 203 mg/dL — ABNORMAL HIGH (ref 0–200)
Triglycerides: 307 mg/dL — ABNORMAL HIGH (ref <150)

## 2012-05-18 MED ORDER — VENLAFAXINE HCL ER 75 MG PO CP24: 75.0000 mg | ORAL_CAPSULE | Freq: Every day | ORAL | Status: DC

## 2012-05-18 MED ORDER — ATORVASTATIN CALCIUM 10 MG PO TABS: 10.0000 mg | ORAL_TABLET | Freq: Every day | ORAL | Status: DC

## 2012-05-18 NOTE — Progress Notes (Signed)
  Subjective:    Patient ID: Jodi Duffy, female    DOB: 06-13-1951, 61 y.o.   MRN: [id number]  HPI She is here for medication check. She does have right knee arthritis and has had a recent arthroscopy. She does have bone-on-bone damage and apparently will need a replacement at some point in the future. She uses Anaprox for pain relief. She continues on Effexor which helps her mood. She has tried stopping this in the past with little good success. She would like to continue on this. Her allergies are seasonal and cluster very little difficulty. She continues on her Lipitor and is having no difficulty with this.   Review of Systems     Objective:   Physical Exam Alert and in no distress. Cardiac exam shows regular rhythm without murmurs or gallops. Lungs are clear to auscultation.       Assessment & Plan:   1. Arthritis    2. Dysthymia  venlafaxine XR (EFFEXOR-XR) 75 MG 24 hr capsule  3. Hyperlipidemia LDL goal < 100  Lipid panel, atorvastatin (LIPITOR) 10 MG tablet  4. Allergic rhinitis, seasonal    5. Encounter for long-term (current) use of other medications  CBC with Differential, Comprehensive metabolic panel, Lipid panel   I discussed arthritis and knee replacement with her in detail. The give her some guidance on when to potentially move forward with this. I will continue her on Effexor indefinitely. Continue on other medications. Strongly encouraged her to increase her rehabilitation on the knee.

## 2012-05-30 ENCOUNTER — Other Ambulatory Visit: Payer: Self-pay | Admitting: Family Medicine

## 2012-05-31 ENCOUNTER — Other Ambulatory Visit: Payer: Self-pay

## 2012-05-31 NOTE — Telephone Encounter (Signed)
Pt called and said she didn't know she was to fast that is why her tri are high

## 2012-07-22 ENCOUNTER — Encounter: Payer: Self-pay | Admitting: Family Medicine

## 2012-07-22 ENCOUNTER — Other Ambulatory Visit: Payer: Self-pay

## 2012-07-22 ENCOUNTER — Ambulatory Visit (INDEPENDENT_AMBULATORY_CARE_PROVIDER_SITE_OTHER): Payer: BC Managed Care – PPO | Admitting: Family Medicine

## 2012-07-22 VITALS — BP 116/70 | HR 78 | Wt 147.0 lb

## 2012-07-22 DIAGNOSIS — Z8249 Family history of ischemic heart disease and other diseases of the circulatory system: Secondary | ICD-10-CM | POA: Insufficient documentation

## 2012-07-22 MED ORDER — HYDROCOD POLST-CHLORPHEN POLST 10-8 MG/5ML PO LQCR: 5.0000 mL | Freq: Two times a day (BID) | ORAL | Status: DC | PRN

## 2012-07-22 NOTE — Progress Notes (Signed)
  Subjective:    Patient ID: MERYEM HAERTEL, female    DOB: [birth year], 61 y.o.   MRN: [id number]  HPI She is here for preoperative evaluation prior to having right total knee replacement. She continues on medications listed in the chart. She states that her mother had valvular surgery after having rheumatic disease as a child. She is not sure whether she also had a CABG area she states that her father had angina and was using nitroglycerin. He apparently died at age 80 and had the nitroglycerin for several years prior to that. He died in 55. She has had no chest pain, shortness of breath, diaphoresis.  Review of Systems     Objective:   Physical Exam Alert and in no distress. Cardiac exam shows regular rhythm without murmurs or gallops. Lungs clear to auscultation. EKG shows questionable slight ST elevations in the inferior leads as well as lateral.      Assessment & Plan:   1. Family history of heart disease in female family member before age 4  Ambulatory referral to Cardiology, PR ELECTROCARDIOGRAM, COMPLETE   I explained that I thought it would be prudent to get a cardiology evaluation and she is comfortable with this approach.

## 2012-07-28 ENCOUNTER — Encounter: Payer: Self-pay | Admitting: Cardiology

## 2012-07-28 ENCOUNTER — Ambulatory Visit (INDEPENDENT_AMBULATORY_CARE_PROVIDER_SITE_OTHER): Payer: BC Managed Care – PPO | Admitting: Cardiology

## 2012-07-28 VITALS — BP 142/90 | HR 84 | Ht 63.0 in | Wt 146.8 lb

## 2012-07-28 DIAGNOSIS — Z8249 Family history of ischemic heart disease and other diseases of the circulatory system: Secondary | ICD-10-CM

## 2012-07-28 DIAGNOSIS — Z01810 Encounter for preprocedural cardiovascular examination: Secondary | ICD-10-CM

## 2012-07-28 DIAGNOSIS — E785 Hyperlipidemia, unspecified: Secondary | ICD-10-CM

## 2012-07-28 NOTE — Progress Notes (Signed)
Jodi Duffy Date of Birth: [birth year] Medical Record #[id number]  History of Present Illness: Jodi Duffy is seen at the request of Dr. Susann Givens for preoperative clearance for knee surgery. She is a very pleasant 61 year old white female without prior cardiac history. She has progressive arthritis in her right knee. Total knee replacement has been recommended. Patient has no history of cardiac disease. She denies any chest pain, shortness of breath, palpitations, or edema. Prior to her knee becoming worse, she was active. She does have a family history of coronary disease. Her father died at age 22 of a pulmonary embolus following hip surgery. She reports that he used nitroglycerin since his 45s. Her mother had some type of valvular heart surgery for rheumatic heart disease. She has one brother who is alive and well. Jodi Duffy denies any history of diabetes or hypertension. She does have hyperlipidemia and was started on therapy this past year.  Current Outpatient Prescriptions on File Prior to Visit  Medication Sig Dispense Refill  . atorvastatin (LIPITOR) 10 MG tablet Take 1 tablet (10 mg total) by mouth daily.  30 tablet  11  . cetirizine-pseudoephedrine (ZYRTEC-D) 5-120 MG per tablet Take 1 tablet by mouth daily.        . fish oil-omega-3 fatty acids 1000 MG capsule Take 2 g by mouth daily.        . naproxen sodium (ANAPROX) 220 MG tablet Take 220 mg by mouth 2 (two) times daily with a meal.        . venlafaxine XR (EFFEXOR-XR) 75 MG 24 hr capsule Take 1 capsule (75 mg total) by mouth daily.  30 capsule  11    Allergies  Allergen Reactions  . Penicillins Hives    Past Medical History  Diagnosis Date  . Allergy     RHINITIS  . Depression   . TMJ syndrome   . Osteopenia   . Arthritis     Past Surgical History  Procedure Date  . Joint replacement     RIGHT KNEE  . Knee arthroscopy     right knee, 1990 and 2012, Dr. Cleophas Dunker    History  Smoking status  . Never Smoker     Smokeless tobacco  . Not on file    History  Alcohol Use No    Family History  Problem Relation Age of Onset  . Heart disease Mother     valve replacement  . Cancer Mother     breast, throat  . Heart disease Father     died of brain embolism after hip surgery  . Arthritis Father   . Arthritis Brother   . Cancer Maternal Aunt     various cancers  . Diabetes Neg Hx   . Hypertension Neg Hx   . Stroke Neg Hx     Review of Systems: The review of systems is positive for right knee arthralgias.  All other systems were reviewed and are negative.  Physical Exam: BP 142/90  Pulse 84  Ht 5\' 3"  (1.6 m)  Wt 66.588 kg (146 lb 12.8 oz)  BMI 26.00 kg/m2 She is a pleasant white female in no acute distress.The patient is alert and oriented x 3.  The mood and affect are normal.  The skin is warm and dry.  Color is normal.  The HEENT exam reveals that the sclera are nonicteric.  The mucous membranes are moist.  The carotids are 2+ without bruits.  There is no thyromegaly.  There is no JVD.  The  lungs are clear.  The chest wall is non tender.  The heart exam reveals a regular rate with a normal S1 and S2.  There are no murmurs, gallops, or rubs.  The PMI is not displaced.   Abdominal exam reveals good bowel sounds.      Exam of the legs reveal no clubbing, cyanosis, or edema.  Right knee is enlarged compared to the left.  The distal pulses are intact.  Cranial nerves II - XII are intact.  Motor and sensory functions are intact.  The gait is normal.  LABORATORY DATA: ECG demonstrates normal sinus rhythm with a normal ECG.  Assessment / Plan: 1. Preoperative risk assessment for right total knee replacement. Patient has no signs or symptoms of cardiac disease. ECG and exam are normal. I feel that her cardiac risk for surgery is low. The type of surgery she is having is also relatively low risk. I do not feel that she needs any further stress testing prior to clearing her for surgery and we will go  ahead and clear her. I would consider postoperative anticoagulation to reduce her risk of DVT particularly given her father's history.  2. Hyperlipidemia on Lipitor and fish oil.

## 2012-08-03 ENCOUNTER — Encounter (HOSPITAL_COMMUNITY): Payer: Self-pay | Admitting: Pharmacy Technician

## 2012-08-05 ENCOUNTER — Encounter (HOSPITAL_COMMUNITY)
Admission: RE | Admit: 2012-08-05 | Discharge: 2012-08-05 | Disposition: A | Discharge status: Home / Self Care | Payer: BC Managed Care – PPO | Source: Ambulatory Visit | Attending: Orthopaedic Surgery | Admitting: Orthopaedic Surgery

## 2012-08-05 ENCOUNTER — Encounter (HOSPITAL_COMMUNITY): Payer: Self-pay

## 2012-08-05 ENCOUNTER — Encounter (HOSPITAL_COMMUNITY)
Admission: RE | Admit: 2012-08-05 | Discharge: 2012-08-05 | Disposition: A | Discharge status: Home / Self Care | Payer: BC Managed Care – PPO | Source: Ambulatory Visit | Attending: Orthopedic Surgery | Admitting: Orthopedic Surgery

## 2012-08-05 LAB — TYPE AND SCREEN

## 2012-08-05 LAB — URINALYSIS, ROUTINE W REFLEX MICROSCOPIC
Bilirubin Urine: NEGATIVE
Nitrite: NEGATIVE
Specific Gravity, Urine: 1.01 (ref 1.005–1.030)
pH: 8 (ref 5.0–8.0)

## 2012-08-05 LAB — ABO/RH: ABO/RH(D): O POS

## 2012-08-05 LAB — CBC
HCT: 39.8 % (ref 36.0–46.0)
Hemoglobin: 13.2 g/dL (ref 12.0–15.0)
WBC: 5 10*3/uL (ref 4.0–10.5)

## 2012-08-05 LAB — PROTIME-INR: Prothrombin Time: 13.5 seconds (ref 11.6–15.2)

## 2012-08-05 LAB — COMPREHENSIVE METABOLIC PANEL
Alkaline Phosphatase: 69 U/L (ref 39–117)
BUN: 19 mg/dL (ref 6–23)
CO2: 29 mEq/L (ref 19–32)
Chloride: 102 mEq/L (ref 96–112)
GFR calc Af Amer: >90 mL/min (ref >90)
GFR calc non Af Amer: >90 mL/min (ref >90)
Glucose, Bld: 108 mg/dL — ABNORMAL HIGH (ref 70–99)
Potassium: 3.5 mEq/L (ref 3.5–5.1)
Total Bilirubin: 0.2 mg/dL — ABNORMAL LOW (ref 0.3–1.2)

## 2012-08-05 LAB — APTT: aPTT: 31 seconds (ref 24–37)

## 2012-08-05 LAB — URINE MICROSCOPIC-ADD ON

## 2012-08-05 MED ORDER — CHLORHEXIDINE GLUCONATE 4 % EX LIQD: 60.0000 mL | Freq: Every day | CUTANEOUS | Status: DC

## 2012-08-05 MED ORDER — CHLORHEXIDINE GLUCONATE 4 % EX LIQD: 60.0000 mL | Freq: Once | CUTANEOUS | Status: DC

## 2012-08-05 NOTE — Pre-Procedure Instructions (Addendum)
[address]  08/05/2012   Your procedure is scheduled on:  Sept 24, 2013  Report to [facility] at 5:30 AM.  Call this number if you have problems the morning of surgery: [phone]   Remember:   Do not eat food:After Midnight.    Take these medicines the morning of surgery with A SIP OF WATER: zyrtec, effexor   Do not wear jewelry, make-up or nail polish.  Do not wear lotions, powders, or perfumes. You may wear deodorant.  Do not shave 48 hours prior to surgery. Men may shave face and neck.  Do not bring valuables to the hospital.  Contacts, dentures or bridgework may not be worn into surgery.  Leave suitcase in the car. After surgery it may be brought to your room.  For patients admitted to the hospital, checkout time is 11:00 AM the day of discharge.   Patients discharged the day of surgery will not be allowed to drive home.  Name and phone number of your driver:   Special Instructions: Incentive Spirometry - Practice and bring it with you on the day of surgery. and CHG Shower Shower 2 days before surgery and 1 day before surgery with Hibiclens.   Please read over the following fact sheets that you were given: Pain Booklet, Coughing and Deep Breathing, Blood Transfusion Information, Total Joint Packet and Surgical Site Infection Prevention

## 2012-08-06 LAB — URINE CULTURE

## 2012-08-09 MED ORDER — ACETAMINOPHEN 10 MG/ML IV SOLN
1000.0000 mg | Freq: Once | INTRAVENOUS | Status: AC
  Administered 2012-08-10: 1000 mg via INTRAVENOUS
  Filled 2012-08-09: qty 100

## 2012-08-09 MED ORDER — VANCOMYCIN HCL IN DEXTROSE 1-5 GM/200ML-% IV SOLN
1000.0000 mg | INTRAVENOUS | Status: AC
  Administered 2012-08-10: 1000 mg via INTRAVENOUS
  Filled 2012-08-09: qty 200

## 2012-08-09 MED ORDER — SODIUM CHLORIDE 0.9 % IV SOLN
INTRAVENOUS | Status: DC
  Administered 2012-08-10: 07:00:00 via INTRAVENOUS

## 2012-08-09 NOTE — H&P (Signed)
CHIEF COMPLAINT:  Painful right knee.   HISTORY OF PRESENT ILLNESS:  Jodi Duffy is a very pleasant 61 year old white female who is seen today for evaluation of her right knee.  Jodi Duffy was initially seen back in October 2012 with a 4-week history of right knee pain, which was sudden onset.  Apparently, she was just getting up 1 evening and performed either bending or stooping or squatting or twisting injury to her right knee.  She had this fairly moderate pain, which is more of an aching quality at that time.  There was still certain positions that she had a catching feeling.  Previous history of a right knee arthroscopy 10 years previously for a tear of the lateral meniscus and had done well up until the 4 weeks ago.  She had a corticosteroid injection in October, which lasted only for several days.  Because of this an MRI was ordered and an arthroscopic debridement was obtained that of a removal of a lateral loose body and a lateral meniscectomy.  She continued to have symptoms in the knee and after surgery.  She is about 3 months post arthroscopic debridement and was noted to have bone on bone of the lateral compartment.   Corticosteroid injection was given in February the 13 as well as March the 25.  This apparently did not have as much of a benefit.  It was then felt that the use of Viscosupplementation would be entertained and she underwent 5 Hyalgan injections.  She states that this really did not do much in regards to her pain management or improvement in her symptoms.  She continues to have pain, discomfort and had undergone another corticosteroid injection in May.  Her only other option because of her consistently painful knee was to consider a total joint replacement.  She is therefore seen today for review of her knee and possible consideration of surgical intervention.   Past medical history and general health is good.  Hospitalization include that of a tonsillectomy, which she did have significant  hemorrhage in 1955.  She also had TMJ surgery in 2005.  Childbirth in 73 and 1990.     MEDICATIONS:  Aleve.   ALLERGIES:  Penicillin, which she had hives at age 17.   REVIEW OF SYSTEMS:  A 14 point review of systems positive for glasses, hematemesis after hemorrhaging after tonsillectomy.   FAMILY HISTORY:  Positive for mother who died at age 33.  She had breast and throat cancer.  Father died at 81 after a total hip replacement, possibly either an MI or a PE.  Her brother is 57 and is living and she has no sisters.     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  She smoked cigarettes for 12 years, about a 1 pack per day, but stopped in 1981.  She does drink a glass of wine daily.     PHYSICAL EXAMINATION:  Reveals a 61 year old white female, well developed, well nourished, alert, pleasant, cooperative, moderate distress secondary to right knee pain.    Vital signs:  Height 5 feet 3 inches.  Weight:  142 pounds.  BMI:  25.2.    Temperature is 97.8.  Pulse:  80.  Respirations:  16.  Blood pressure:  172/85.    Head:  Normocephalic. Eyes:  Pupils equal, round, reactive to light and accommodation with extraocular movements intact.   Ears:  Benign. Nose:  Benign. Throat:  Benign. Neck:  Supple.  No thyromegaly.  No carotid bruits. Chest:  Good expansion. Lungs:  Essentially clear. Cardiac:  Regular rhythm and rate.  Normal S1, S2.  No murmurs, rubs, gallops appreciated. Neurovascular:  Pulses were 1+ bilateral and symmetric in the lower extremity. Abdomen:  Scaphoid soft, nontender.  No masses palpable.  Normal bowel sounds present.   Genital:  Not indicated for an orthopaedic evaluation.   Rectal:  Not indicated for an orthopaedic evaluation.   Breast:  Not indicated for an orthopaedic evaluation.   CNS:  She is oriented x3 and cranial nerves II-XII grossly intact. Musculoskeletal:  She has range of motion from about 3 to 4 degrees to 115 degrees.   She does have a 1+ effusion.  She has crepitus with range of motion.  She does have a little bit of pseudolaxity with varus stressing.  She is valgus, but can correct this to near zero.     RADIOGRAPHS:  Angulation about 11 degrees of valgus deformity.  Some patellofemoral OA.  She appears to have AVN or cystic formation of the tibial plateau laterally.  Very sclerotic area over both the femoral condyle and lateral tibial plateau.   CLINICAL IMPRESSION:  End-stage OA, right knee.   RECOMMENDATIONS:  At this time, we feel she is a candidate for total knee replacement.  I have reviewed the clearance form from Dr. Susann Givens and we are going to plan on proceeding with a right total knee replacement.  Procedure risks and benefits were explained to her in detail.  All questions were answered.  She will proceed once we have obtained appropriate laboratory studies from the hospital as well as chest x-ray, EKG.  If anything is abnormal then we will need to reconsider proceeding with total joint replacement.  She is understanding.    Oris Drone Aleda [facility] [phone]  08/09/2012 6:46 PM

## 2012-08-10 ENCOUNTER — Encounter (HOSPITAL_COMMUNITY): Payer: Self-pay | Admitting: Anesthesiology

## 2012-08-10 ENCOUNTER — Inpatient Hospital Stay (HOSPITAL_COMMUNITY)
Admission: RE | Admit: 2012-08-10 | Discharge: 2012-08-12 | DRG: 209 | Disposition: A | Discharge status: Home / Self Care | Payer: BC Managed Care – PPO | Source: Ambulatory Visit | Attending: Orthopaedic Surgery | Admitting: Orthopaedic Surgery

## 2012-08-10 ENCOUNTER — Inpatient Hospital Stay (HOSPITAL_COMMUNITY): Payer: BC Managed Care – PPO | Admitting: Anesthesiology

## 2012-08-10 ENCOUNTER — Encounter (HOSPITAL_COMMUNITY)
Admission: RE | Disposition: A | Discharge status: Home / Self Care | Payer: Self-pay | Source: Ambulatory Visit | Attending: Orthopaedic Surgery

## 2012-08-10 ENCOUNTER — Encounter (HOSPITAL_COMMUNITY): Payer: Self-pay | Admitting: *Deleted

## 2012-08-10 DIAGNOSIS — F341 Dysthymic disorder: Secondary | ICD-10-CM

## 2012-08-10 DIAGNOSIS — Z96659 Presence of unspecified artificial knee joint: Secondary | ICD-10-CM | POA: Diagnosis present

## 2012-08-10 DIAGNOSIS — IMO0002 Reserved for concepts with insufficient information to code with codable children: Principal | ICD-10-CM | POA: Diagnosis present

## 2012-08-10 DIAGNOSIS — Z01812 Encounter for preprocedural laboratory examination: Secondary | ICD-10-CM

## 2012-08-10 DIAGNOSIS — Z87891 Personal history of nicotine dependence: Secondary | ICD-10-CM

## 2012-08-10 DIAGNOSIS — D62 Acute posthemorrhagic anemia: Secondary | ICD-10-CM | POA: Diagnosis not present

## 2012-08-10 DIAGNOSIS — E785 Hyperlipidemia, unspecified: Secondary | ICD-10-CM

## 2012-08-10 DIAGNOSIS — M1710 Unilateral primary osteoarthritis, unspecified knee: Secondary | ICD-10-CM

## 2012-08-10 DIAGNOSIS — F3289 Other specified depressive episodes: Secondary | ICD-10-CM | POA: Diagnosis present

## 2012-08-10 DIAGNOSIS — Z01811 Encounter for preprocedural respiratory examination: Secondary | ICD-10-CM

## 2012-08-10 DIAGNOSIS — F329 Major depressive disorder, single episode, unspecified: Secondary | ICD-10-CM | POA: Diagnosis present

## 2012-08-10 HISTORY — PX: TOTAL KNEE ARTHROPLASTY: SHX125

## 2012-08-10 SURGERY — ARTHROPLASTY, KNEE, TOTAL
Anesthesia: General | Site: Knee | Laterality: Right | Wound class: Clean

## 2012-08-10 MED ORDER — ACETAMINOPHEN 10 MG/ML IV SOLN
1000.0000 mg | Freq: Four times a day (QID) | INTRAVENOUS | Status: AC
  Administered 2012-08-10 – 2012-08-11 (×4): 1000 mg via INTRAVENOUS
  Filled 2012-08-10 (×4): qty 100

## 2012-08-10 MED ORDER — EPHEDRINE SULFATE 50 MG/ML IJ SOLN: INTRAMUSCULAR | Status: DC | PRN

## 2012-08-10 MED ORDER — NEOSTIGMINE METHYLSULFATE 1 MG/ML IJ SOLN
INTRAMUSCULAR | Status: DC | PRN
  Administered 2012-08-10: 3 mg via INTRAVENOUS

## 2012-08-10 MED ORDER — LIDOCAINE HCL (CARDIAC) 20 MG/ML IV SOLN
INTRAVENOUS | Status: DC | PRN
  Administered 2012-08-10: 30 mg via INTRAVENOUS

## 2012-08-10 MED ORDER — ONDANSETRON HCL 4 MG PO TABS
4.0000 mg | ORAL_TABLET | Freq: Four times a day (QID) | ORAL | Status: DC | PRN
  Filled 2012-08-10: qty 1

## 2012-08-10 MED ORDER — ROCURONIUM BROMIDE 100 MG/10ML IV SOLN
INTRAVENOUS | Status: DC | PRN
  Administered 2012-08-10: 10 mg via INTRAVENOUS
  Administered 2012-08-10 (×2): 5 mg via INTRAVENOUS
  Administered 2012-08-10: 50 mg via INTRAVENOUS

## 2012-08-10 MED ORDER — ALUM & MAG HYDROXIDE-SIMETH 200-200-20 MG/5ML PO SUSP: 30.0000 mL | ORAL | Status: DC | PRN

## 2012-08-10 MED ORDER — ONDANSETRON HCL 4 MG/2ML IJ SOLN
INTRAMUSCULAR | Status: DC | PRN
  Administered 2012-08-10: 4 mg via INTRAVENOUS

## 2012-08-10 MED ORDER — DOCUSATE SODIUM 100 MG PO CAPS
100.0000 mg | ORAL_CAPSULE | Freq: Two times a day (BID) | ORAL | Status: DC
  Administered 2012-08-10 – 2012-08-12 (×5): 100 mg via ORAL
  Filled 2012-08-10 (×6): qty 1

## 2012-08-10 MED ORDER — METHOCARBAMOL 500 MG PO TABS
500.0000 mg | ORAL_TABLET | Freq: Four times a day (QID) | ORAL | Status: DC | PRN
  Administered 2012-08-11 – 2012-08-12 (×2): 500 mg via ORAL
  Filled 2012-08-10 (×2): qty 1

## 2012-08-10 MED ORDER — HYDROMORPHONE HCL PF 1 MG/ML IJ SOLN: 0.2500 mg | INTRAMUSCULAR | Status: DC | PRN

## 2012-08-10 MED ORDER — SODIUM CHLORIDE 0.9 % IV SOLN
75.0000 mL/h | INTRAVENOUS | Status: DC
  Administered 2012-08-11: 75 mL/h via INTRAVENOUS

## 2012-08-10 MED ORDER — METOCLOPRAMIDE HCL 10 MG PO TABS: 5.0000 mg | ORAL_TABLET | Freq: Three times a day (TID) | ORAL | Status: DC | PRN

## 2012-08-10 MED ORDER — DEXTROSE 5 % IV SOLN
500.0000 mg | Freq: Four times a day (QID) | INTRAVENOUS | Status: DC | PRN
  Filled 2012-08-10: qty 5

## 2012-08-10 MED ORDER — GLYCOPYRROLATE 0.2 MG/ML IJ SOLN
INTRAMUSCULAR | Status: DC | PRN
  Administered 2012-08-10: 0.4 mg via INTRAVENOUS

## 2012-08-10 MED ORDER — RIVAROXABAN 10 MG PO TABS
10.0000 mg | ORAL_TABLET | Freq: Every day | ORAL | Status: DC
  Administered 2012-08-10 – 2012-08-11 (×2): 10 mg via ORAL
  Filled 2012-08-10 (×3): qty 1

## 2012-08-10 MED ORDER — METOCLOPRAMIDE HCL 5 MG/ML IJ SOLN: 5.0000 mg | Freq: Three times a day (TID) | INTRAMUSCULAR | Status: DC | PRN

## 2012-08-10 MED ORDER — MIDAZOLAM HCL 2 MG/2ML IJ SOLN: 0.5000 mg | Freq: Once | INTRAMUSCULAR | Status: DC | PRN

## 2012-08-10 MED ORDER — HYDROMORPHONE HCL PF 1 MG/ML IJ SOLN
INTRAMUSCULAR | Status: AC
  Filled 2012-08-10: qty 1

## 2012-08-10 MED ORDER — PROMETHAZINE HCL 25 MG/ML IJ SOLN: 6.2500 mg | INTRAMUSCULAR | Status: DC | PRN

## 2012-08-10 MED ORDER — PROPOFOL 10 MG/ML IV BOLUS
INTRAVENOUS | Status: DC | PRN
  Administered 2012-08-10: 200 mg via INTRAVENOUS

## 2012-08-10 MED ORDER — SODIUM CHLORIDE 0.9 % IR SOLN
Status: DC | PRN
  Administered 2012-08-10: 3000 mL
  Administered 2012-08-10: 1000 mL

## 2012-08-10 MED ORDER — MEPERIDINE HCL 25 MG/ML IJ SOLN: 6.2500 mg | INTRAMUSCULAR | Status: DC | PRN

## 2012-08-10 MED ORDER — VANCOMYCIN HCL IN DEXTROSE 1-5 GM/200ML-% IV SOLN
1000.0000 mg | Freq: Two times a day (BID) | INTRAVENOUS | Status: AC
  Administered 2012-08-10: 1000 mg via INTRAVENOUS
  Filled 2012-08-10: qty 200

## 2012-08-10 MED ORDER — PHENOL 1.4 % MT LIQD
1.0000 | OROMUCOSAL | Status: DC | PRN
  Administered 2012-08-10: 1 via OROMUCOSAL
  Filled 2012-08-10: qty 177

## 2012-08-10 MED ORDER — KETOROLAC TROMETHAMINE 15 MG/ML IJ SOLN
15.0000 mg | Freq: Four times a day (QID) | INTRAMUSCULAR | Status: AC
  Administered 2012-08-10 (×2): 15 mg via INTRAVENOUS
  Filled 2012-08-10 (×2): qty 1

## 2012-08-10 MED ORDER — BUPIVACAINE-EPINEPHRINE PF 0.25-1:200000 % IJ SOLN
INTRAMUSCULAR | Status: AC
  Filled 2012-08-10: qty 30

## 2012-08-10 MED ORDER — VENLAFAXINE HCL ER 75 MG PO CP24
75.0000 mg | ORAL_CAPSULE | Freq: Every day | ORAL | Status: DC
  Administered 2012-08-11 – 2012-08-12 (×2): 75 mg via ORAL
  Filled 2012-08-10 (×2): qty 1

## 2012-08-10 MED ORDER — ONDANSETRON HCL 4 MG/2ML IJ SOLN
4.0000 mg | Freq: Four times a day (QID) | INTRAMUSCULAR | Status: DC | PRN
  Administered 2012-08-12: 4 mg via INTRAVENOUS
  Filled 2012-08-10 (×2): qty 2

## 2012-08-10 MED ORDER — OXYCODONE HCL 5 MG PO TABS
5.0000 mg | ORAL_TABLET | ORAL | Status: DC | PRN
  Administered 2012-08-11 – 2012-08-12 (×6): 10 mg via ORAL
  Filled 2012-08-10 (×6): qty 2

## 2012-08-10 MED ORDER — MAGNESIUM HYDROXIDE 400 MG/5ML PO SUSP: 30.0000 mL | Freq: Every day | ORAL | Status: DC | PRN

## 2012-08-10 MED ORDER — MENTHOL 3 MG MT LOZG
1.0000 | LOZENGE | OROMUCOSAL | Status: DC | PRN
  Filled 2012-08-10: qty 9

## 2012-08-10 MED ORDER — BUPIVACAINE-EPINEPHRINE PF 0.5-1:200000 % IJ SOLN
INTRAMUSCULAR | Status: DC | PRN
  Administered 2012-08-10: 30 mL

## 2012-08-10 MED ORDER — FLEET ENEMA 7-19 GM/118ML RE ENEM: 1.0000 | ENEMA | Freq: Once | RECTAL | Status: AC | PRN

## 2012-08-10 MED ORDER — FENTANYL CITRATE 0.05 MG/ML IJ SOLN
INTRAMUSCULAR | Status: DC | PRN
  Administered 2012-08-10: 200 ug via INTRAVENOUS
  Administered 2012-08-10: 50 ug via INTRAVENOUS

## 2012-08-10 MED ORDER — BUPIVACAINE-EPINEPHRINE 0.25% -1:200000 IJ SOLN
INTRAMUSCULAR | Status: DC | PRN
  Administered 2012-08-10: 30 mL

## 2012-08-10 MED ORDER — HYDROMORPHONE HCL PF 1 MG/ML IJ SOLN: 0.5000 mg | INTRAMUSCULAR | Status: DC | PRN

## 2012-08-10 MED ORDER — LACTATED RINGERS IV SOLN
INTRAVENOUS | Status: DC | PRN
  Administered 2012-08-10 (×2): via INTRAVENOUS

## 2012-08-10 MED ORDER — ATORVASTATIN CALCIUM 10 MG PO TABS
10.0000 mg | ORAL_TABLET | Freq: Every day | ORAL | Status: DC
  Administered 2012-08-10 – 2012-08-12 (×3): 10 mg via ORAL
  Filled 2012-08-10 (×3): qty 1

## 2012-08-10 MED ORDER — ACETAMINOPHEN 10 MG/ML IV SOLN
INTRAVENOUS | Status: AC
  Filled 2012-08-10: qty 100

## 2012-08-10 MED ORDER — INFLUENZA VIRUS VACC SPLIT PF IM SUSP
0.5000 mL | INTRAMUSCULAR | Status: AC
  Administered 2012-08-11: 0.5 mL via INTRAMUSCULAR
  Filled 2012-08-10: qty 0.5

## 2012-08-10 MED ORDER — EPHEDRINE SULFATE 50 MG/ML IJ SOLN
INTRAMUSCULAR | Status: DC | PRN
  Administered 2012-08-10: 5 mg via INTRAVENOUS

## 2012-08-10 MED ORDER — BISACODYL 10 MG RE SUPP: 10.0000 mg | Freq: Every day | RECTAL | Status: DC | PRN

## 2012-08-10 MED ORDER — MIDAZOLAM HCL 5 MG/5ML IJ SOLN
INTRAMUSCULAR | Status: DC | PRN
  Administered 2012-08-10: 1 mg via INTRAVENOUS

## 2012-08-10 SURGICAL SUPPLY — 61 items
BANDAGE ESMARK 6X9 LF (GAUZE/BANDAGES/DRESSINGS) ×1 IMPLANT
BLADE SAGITTAL 25.0X1.19X90 (BLADE) ×2 IMPLANT
BNDG ESMARK 6X9 LF (GAUZE/BANDAGES/DRESSINGS) ×2
BOWL SMART MIX CTS (DISPOSABLE) ×2 IMPLANT
CEMENT HV SMART SET (Cement) ×4 IMPLANT
CLOTH BEACON ORANGE TIMEOUT ST (SAFETY) ×2 IMPLANT
COVER BACK TABLE 24X17X13 BIG (DRAPES) ×2 IMPLANT
COVER SURGICAL LIGHT HANDLE (MISCELLANEOUS) ×2 IMPLANT
CUFF TOURNIQUET SINGLE 34IN LL (TOURNIQUET CUFF) ×2 IMPLANT
CUFF TOURNIQUET SINGLE 44IN (TOURNIQUET CUFF) IMPLANT
DRAPE EXTREMITY T 121X128X90 (DRAPE) ×2 IMPLANT
DRAPE PROXIMA HALF (DRAPES) ×2 IMPLANT
DRSG ADAPTIC 3X8 NADH LF (GAUZE/BANDAGES/DRESSINGS) IMPLANT
DRSG PAD ABDOMINAL 8X10 ST (GAUZE/BANDAGES/DRESSINGS) IMPLANT
DURAPREP 26ML APPLICATOR (WOUND CARE) ×2 IMPLANT
ELECT CAUTERY BLADE 6.4 (BLADE) ×2 IMPLANT
ELECT REM PT RETURN 9FT ADLT (ELECTROSURGICAL) ×2
ELECTRODE REM PT RTRN 9FT ADLT (ELECTROSURGICAL) ×1 IMPLANT
EVACUATOR 1/8 PVC DRAIN (DRAIN) ×2 IMPLANT
FACESHIELD LNG OPTICON STERILE (SAFETY) ×8 IMPLANT
FLOSEAL 10ML (HEMOSTASIS) IMPLANT
GLOVE BIO SURGEON STRL SZ8.5 (GLOVE) ×4 IMPLANT
GLOVE BIOGEL PI IND STRL 8 (GLOVE) ×2 IMPLANT
GLOVE BIOGEL PI IND STRL 8.5 (GLOVE) ×1 IMPLANT
GLOVE BIOGEL PI INDICATOR 8 (GLOVE) ×2
GLOVE BIOGEL PI INDICATOR 8.5 (GLOVE) ×1
GLOVE ECLIPSE 8.0 STRL XLNG CF (GLOVE) ×6 IMPLANT
GLOVE ECLIPSE 8.5 STRL (GLOVE) ×2 IMPLANT
GLOVE SURG ORTHO 8.5 STRL (GLOVE) ×6 IMPLANT
GLOVE SURG SS PI 7.5 STRL IVOR (GLOVE) ×2 IMPLANT
GOWN PREVENTION PLUS XXLARGE (GOWN DISPOSABLE) ×4 IMPLANT
GOWN STRL NON-REIN LRG LVL3 (GOWN DISPOSABLE) ×2 IMPLANT
GOWN STRL REIN XL XLG (GOWN DISPOSABLE) ×2 IMPLANT
HANDPIECE INTERPULSE COAX TIP (DISPOSABLE) ×1
KIT BASIN OR (CUSTOM PROCEDURE TRAY) ×2 IMPLANT
KIT ROOM TURNOVER OR (KITS) ×2 IMPLANT
MANIFOLD NEPTUNE II (INSTRUMENTS) ×2 IMPLANT
NEEDLE 22X1 1/2 (OR ONLY) (NEEDLE) ×2 IMPLANT
NS IRRIG 1000ML POUR BTL (IV SOLUTION) ×2 IMPLANT
PACK TOTAL JOINT (CUSTOM PROCEDURE TRAY) ×2 IMPLANT
PAD ARMBOARD 7.5X6 YLW CONV (MISCELLANEOUS) ×2 IMPLANT
PAD CAST 4YDX4 CTTN HI CHSV (CAST SUPPLIES) IMPLANT
PADDING CAST COTTON 4X4 STRL (CAST SUPPLIES)
PADDING CAST COTTON 6X4 STRL (CAST SUPPLIES) IMPLANT
SET HNDPC FAN SPRY TIP SCT (DISPOSABLE) ×1 IMPLANT
SPONGE GAUZE 4X4 12PLY (GAUZE/BANDAGES/DRESSINGS) IMPLANT
STAPLER VISISTAT 35W (STAPLE) ×2 IMPLANT
SUCTION FRAZIER TIP 10 FR DISP (SUCTIONS) IMPLANT
SUT BONE WAX W31G (SUTURE) ×2 IMPLANT
SUT ETHIBOND NAB CT1 #1 30IN (SUTURE) ×6 IMPLANT
SUT MNCRL AB 3-0 PS2 18 (SUTURE) ×2 IMPLANT
SUT VIC AB 0 CT1 27 (SUTURE) ×1
SUT VIC AB 0 CT1 27XBRD ANBCTR (SUTURE) ×1 IMPLANT
SUT VIC AB 1 CT1 27 (SUTURE) ×1
SUT VIC AB 1 CT1 27XBRD ANBCTR (SUTURE) ×1 IMPLANT
SYR CONTROL 10ML LL (SYRINGE) ×2 IMPLANT
TOWEL OR 17X24 6PK STRL BLUE (TOWEL DISPOSABLE) ×2 IMPLANT
TOWEL OR 17X26 10 PK STRL BLUE (TOWEL DISPOSABLE) ×2 IMPLANT
TRAY FOLEY CATH 14FR (SET/KITS/TRAYS/PACK) ×2 IMPLANT
WATER STERILE IRR 1000ML POUR (IV SOLUTION) ×2 IMPLANT
WRAP KNEE MAXI GEL POST OP (GAUZE/BANDAGES/DRESSINGS) ×2 IMPLANT

## 2012-08-10 NOTE — Anesthesia Preprocedure Evaluation (Addendum)
Anesthesia Evaluation  Patient identified by MRN, date of birth, ID band Patient awake    Reviewed: Allergy & Precautions, H&P , NPO status , Patient's Chart, lab work & pertinent test results  History of Anesthesia Complications Negative for: history of anesthetic complications  Airway Mallampati: II TM Distance: >3 FB Neck ROM: Full    Dental No notable dental hx. (+) Teeth Intact, Caps and Dental Advisory Given   Pulmonary former smoker (quit 30+ years),  breath sounds clear to auscultation  Pulmonary exam normal       Cardiovascular negative cardio ROS  Rhythm:Regular Rate:Normal     Neuro/Psych PSYCHIATRIC DISORDERS Depression negative neurological ROS     GI/Hepatic negative GI ROS, Neg liver ROS,   Endo/Other  negative endocrine ROS  Renal/GU negative Renal ROS     Musculoskeletal  (+) Arthritis -, Osteoarthritis,    Abdominal   Peds  Hematology negative hematology ROS (+)   Anesthesia Other Findings   Reproductive/Obstetrics                        Anesthesia Physical Anesthesia Plan  ASA: II  Anesthesia Plan: General   Post-op Pain Management:    Induction: Intravenous  Airway Management Planned: Oral ETT  Additional Equipment:   Intra-op Plan:   Post-operative Plan: Extubation in OR  Informed Consent: I have reviewed the patients History and Physical, chart, labs and discussed the procedure including the risks, benefits and alternatives for the proposed anesthesia with the patient or authorized representative who has indicated his/her understanding and acceptance.   Dental advisory given  Plan Discussed with: Anesthesiologist, Surgeon and CRNA  Anesthesia Plan Comments: (Plan routine monitors, GETA with femoral nerve block for post op analgesia)       Anesthesia Quick Evaluation

## 2012-08-10 NOTE — Transfer of Care (Signed)
Immediate Anesthesia Transfer of Care Note  Patient: Jodi Duffy  Procedure(s) Performed: Procedure(s) (LRB) with comments: TOTAL KNEE ARTHROPLASTY (Right) - Right Total Knee Arthroplasty  Patient Location: PACU  Anesthesia Type: GA combined with regional for post-op pain  Level of Consciousness: awake, alert  and oriented  Airway & Oxygen Therapy: Patient Spontanous Breathing and Patient connected to nasal cannula oxygen  Post-op Assessment: Report given to PACU RN, Post -op Vital signs reviewed and stable and Patient moving all extremities  Post vital signs: Reviewed and stable  Complications: No apparent anesthesia complications

## 2012-08-10 NOTE — Preoperative (Signed)
Beta Blockers   Reason not to administer Beta Blockers:Not Applicable 

## 2012-08-10 NOTE — Progress Notes (Signed)
Patient ID: Jodi Duffy, female   DOB: [birth year], 61 y.o.   MRN: [id number] The recent History & Physical has been reviewed. I have personally examined the patient today. There is no interval change to the documented History & Physical. The patient would like to proceed with the procedure.  Norlene Campbell W 08/10/2012,  7:19 AM

## 2012-08-10 NOTE — Anesthesia Procedure Notes (Addendum)
Anesthesia Regional Block:  Femoral nerve block  Pre-Anesthetic Checklist: ,, timeout performed, Correct Patient, Correct Site, Correct Laterality, Correct Procedure, Correct Position, site marked, Risks and benefits discussed,  Surgical consent,  Pre-op evaluation,  At surgeon's request and post-op pain management  Laterality: Right  Prep: chloraprep       Needles:  Injection technique: Single-shot  Needle Type: Stimulator Needle - 40     Needle Length: 4cm  Needle Gauge: 22 and 22 G    Additional Needles:  Procedures: nerve stimulator Femoral nerve block  Nerve Stimulator or Paresthesia:  Response: patella twitch, 0.45 mA, 0.1 ms,   Additional Responses:   Narrative:  Start time: 08/10/2012 7:04 AM End time: 08/10/2012 7:09 AM Injection made incrementally with aspirations every 5 mL.  Performed by: Personally  Anesthesiologist: Sandford Craze, MD  Additional Notes: Pt identified in Holding room.  Monitors applied. Working IV access confirmed. Sterile prep R groin.  #22ga PNS to patella twitch at 0.82mA threshold.  30cc 0.5% Bupivacaine with 1:200k epi injected incrementally after negative test dose.  Patient asymptomatic, VSS, no heme aspirated, tolerated well.    Sandford Craze, MD     Procedure Name: Intubation Date/Time: 08/10/2012 7:44 AM Performed by: Windle Guard Pre-anesthesia Checklist: Emergency Drugs available, Patient identified, Timeout performed, Suction available and Patient being monitored Patient Re-evaluated:Patient Re-evaluated prior to inductionOxygen Delivery Method: Circle system utilized Preoxygenation: Pre-oxygenation with 100% oxygen Intubation Type: IV induction Ventilation: Mask ventilation without difficulty Laryngoscope Size: Mac and 3 Grade View: Grade II Tube type: Oral Tube size: 7.5 mm Number of attempts: 1 Airway Equipment and Method: Stylet and LTA kit utilized Placement Confirmation: ETT inserted through vocal cords under direct  vision,  breath sounds checked- equal and bilateral and positive ETCO2 Secured at: 22 cm Tube secured with: Tape Dental Injury: Teeth and Oropharynx as per pre-operative assessment

## 2012-08-10 NOTE — Evaluation (Addendum)
Physical Therapy Evaluation Patient Details Name: Jodi Duffy MRN: [id number] DOB: 1951-11-12 Today's Date: 08/10/2012 Time: 0865-7846 PT Time Calculation (min): 23 min  PT Assessment / Plan / Recommendation Clinical Impression  Pt presents s/p R TKA POD 0 with decreased strength, ROM and mobility in RLE.  Tolerated "dangling" EOB x 10-15 mins and some EOB therex without increase in pain or lightheadedness/dizziness.  Pt will benefit from skilled PT in order to address deficits.  PT recommends HHPT for follow up therapy at D/C to return pt to prior level of functioning.     PT Assessment  Patient needs continued PT services    Follow Up Recommendations  Home health PT    Barriers to Discharge None      Equipment Recommendations  None recommended by PT    Recommendations for Other Services OT consult   Frequency 7X/week    Precautions / Restrictions Precautions Precautions: Knee Restrictions Weight Bearing Restrictions: Yes RLE Weight Bearing: Partial weight bearing RLE Partial Weight Bearing Percentage or Pounds: 50%   Pertinent Vitals/Pain 3/10, ice pack applied      Mobility  Bed Mobility Bed Mobility: Supine to Sit;Sit to Supine;Sitting - Scoot to Edge of Bed Supine to Sit: 4: Min guard Sitting - Scoot to [place] of Bed: 4: Min guard Sit to Supine: 4: Min assist Details for Bed Mobility Assistance: Min/guard to ensure safety of RLE out of bed with min assist to get LE back into bed and cues for hand placement/hip positioning to get EOB and back into bed.  Transfers Transfers: Not assessed Details for Transfer Assistance: Did not perform transfers, only dangled EOB and performed some therex, pt is POD 0.  Ambulation/Gait Ambulation/Gait Assistance: Not tested (comment) Stairs: No Wheelchair Mobility Wheelchair Mobility: No    Exercises Total Joint Exercises Ankle Circles/Pumps: AROM;Strengthening;Both;10 reps;Seated Long Arc Quad: AAROM;Right;15  reps;Seated Knee Flexion: AAROM;Right;10 reps;Seated (with use of pts LLE with AAROM)   PT Diagnosis: Difficulty walking;Generalized weakness;Acute pain  PT Problem List: Decreased strength;Decreased range of motion;Decreased activity tolerance;Decreased mobility;Decreased knowledge of use of DME;Pain;Decreased knowledge of precautions PT Treatment Interventions: DME instruction;Gait training;Stair training;Functional mobility training;Therapeutic activities;Therapeutic exercise;Balance training;Patient/family education   PT Goals Acute Rehab PT Goals PT Goal Formulation: With patient Time For Goal Achievement: 08/13/12 Potential to Achieve Goals: Good Pt will go Supine/Side to Sit: with supervision PT Goal: Supine/Side to Sit - Progress: Goal set today Pt will go Sit to Supine/Side: with supervision PT Goal: Sit to Supine/Side - Progress: Goal set today Pt will go Sit to Stand: with supervision PT Goal: Sit to Stand - Progress: Goal set today Pt will Ambulate: 51 - 150 feet;with supervision;with least restrictive assistive device PT Goal: Ambulate - Progress: Goal set today Pt will Go Up / Down Stairs: 1-2 stairs;with supervision;with least restrictive assistive device PT Goal: Up/Down Stairs - Progress: Goal set today Pt will Perform Home Exercise Program: with supervision, verbal cues required/provided PT Goal: Perform Home Exercise Program - Progress: Goal set today  Visit Information  Last PT Received On: 08/10/12 Assistance Needed: +1    Subjective Data  Subjective: I'm ready Patient Stated Goal: to get home and back to PLOF   Prior Functioning  Home Living Lives With: Spouse Available Help at Discharge: Family Type of Home: House Home Access: Stairs to enter Secretary/administrator of Steps: 1 Entrance Stairs-Rails: None Home Layout: Multi-level;Able to live on main level with bedroom/bathroom Bathroom Shower/Tub: Door;Walk-in Stage manager: Standard Home  Adaptive Equipment: Bedside  commode/3-in-1;Walker - rolling;Straight cane Prior Function Level of Independence: Independent Able to Take Stairs?: Yes Driving: Yes Communication Communication: No difficulties    Cognition  Overall Cognitive Status: Appears within functional limits for tasks assessed/performed Arousal/Alertness: Awake/alert Orientation Level: Appears intact for tasks assessed Behavior During Session: [facility] for tasks performed    Extremity/Trunk Assessment Right Lower Extremity Assessment RLE ROM/Strength/Tone: Deficits RLE ROM/Strength/Tone Deficits: ankle motions WFL, knee flex in sitting with AAROM (from LLE) at 90 deg, unable to perform LAQ without approx 20 deg lag.  RLE Sensation: WFL - Light Touch Left Lower Extremity Assessment LLE ROM/Strength/Tone: WFL for tasks assessed LLE Sensation: WFL - Light Touch LLE Coordination: WFL - gross/fine motor   Balance Balance Balance Assessed: Yes Static Sitting Balance Static Sitting - Balance Support: Bilateral upper extremity supported;Feet supported Static Sitting - Level of Assistance: 5: Stand by assistance Static Sitting - Comment/# of Minutes: Pt able to sit EOB x approx 10 mins at stand by assist for safety.   End of Session PT - End of Session Activity Tolerance: Patient tolerated treatment well Patient left: in bed;with call bell/phone within reach Nurse Communication: Mobility status  GP     Page, Meribeth Mattes 08/10/2012, 4:47 PM

## 2012-08-10 NOTE — Plan of Care (Signed)
Problem: Consults Goal: Diagnosis- Total Joint Replacement Primary Total Knee     

## 2012-08-10 NOTE — Progress Notes (Signed)
Orthopedic Tech Progress Note Patient Details:  Jodi Duffy Apr 25, 1951 [id number] CPM applied to Right LE with appropriate settings. No OHF available at this time. When OHF becomes available it will be applied to patient's bed. CPM Right Knee CPM Right Knee: On Right Knee Flexion (Degrees): 60  Right Knee Extension (Degrees): 0    Asia R Thompson 08/10/2012, 11:24 AM

## 2012-08-10 NOTE — Anesthesia Postprocedure Evaluation (Signed)
  Anesthesia Post-op Note  Patient: Jodi Duffy  Procedure(s) Performed: Procedure(s) (LRB) with comments: TOTAL KNEE ARTHROPLASTY (Right) - Right Total Knee Arthroplasty  Patient Location: PACU  Anesthesia Type: GA combined with regional for post-op pain  Level of Consciousness: awake, alert , oriented and patient cooperative  Airway and Oxygen Therapy: Patient Spontanous Breathing  Post-op Pain: none  Post-op Assessment: Post-op Vital signs reviewed, Patient's Cardiovascular Status Stable, Respiratory Function Stable, Patent Airway, No signs of Nausea or vomiting and Pain level controlled  Post-op Vital Signs: Reviewed and stable  Complications: No apparent anesthesia complications, mild sore throat

## 2012-08-10 NOTE — Progress Notes (Signed)
Orthopedic Tech Progress Note Patient Details:  Jodi Duffy [birth year] [id number]  Patient ID: Jodi Duffy, female   DOB: 11/19/1950, 61 y.o.   MRN: [id number] Trapeze bar patient helper;viewed order from doctor's order list  Nikki Dom 08/10/2012, 4:55 PM

## 2012-08-10 NOTE — Clinical Social Work Note (Signed)
CSW received consult as part of knee replacement order-set. CSW reviewed chart and Pt does not have any obvious CSW needs. Please reconsult if CSW need is identified.  CSW signing off at this time.   Frederico Hamman, LCSW [phone] Covering for Lovette Cliche, LCSWA

## 2012-08-10 NOTE — Op Note (Signed)
PATIENT ID:      ISABELL ORDERS  MRN:     [id number] DOB/AGE:    1951/07/04 / 61 y.o.       OPERATIVE REPORT    DATE OF PROCEDURE:  08/10/2012       PREOPERATIVE DIAGNOSIS:   Osteoarthritis Right Knee                                                       Estimated Body mass index is 26.89 kg/(m^2) as calculated from the following:   Height as of 10/06/11: 5\' 2" (1.575 m).   Weight as of 05/18/12: 147 lb(66.679 kg).     POSTOPERATIVE DIAGNOSIS:   Osteoarthritis Right Knee                                                                     Estimated Body mass index is 26.89 kg/(m^2) as calculated from the following:   Height as of 10/06/11: 5\' 2" (1.575 m).   Weight as of 05/18/12: 147 lb(66.679 kg).     PROCEDURE:  Procedure(s): TOTAL KNEE ARTHROPLASTY right     SURGEON:  Norlene Campbell, MD    ASSISTANT:   Jacqualine Code, PA-C   (Present and scrubbed throughout the case, critical for assistance with exposure, retraction, instrumentation, and closure.)          ANESTHESIA: regional and general     DRAINS: (right knee) Hemovact drain(s) in the open with  Suction Open :      TOURNIQUET TIME:  Total Tourniquet Time Documented: Thigh (Right) - 98 minutes    COMPLICATIONS:  None   CONDITION:  stable  PROCEDURE IN DETAIL: [id number]  Serigne Kubicek W 08/10/2012, 10:14 AM

## 2012-08-11 ENCOUNTER — Encounter (HOSPITAL_COMMUNITY): Payer: Self-pay | Admitting: Orthopaedic Surgery

## 2012-08-11 LAB — CBC
Hemoglobin: 9.4 g/dL — ABNORMAL LOW (ref 12.0–15.0)
MCHC: 33.3 g/dL (ref 30.0–36.0)
Platelets: 196 10*3/uL (ref 150–400)
RBC: 3.13 MIL/uL — ABNORMAL LOW (ref 3.87–5.11)

## 2012-08-11 LAB — BASIC METABOLIC PANEL
CO2: 28 mEq/L (ref 19–32)
GFR calc non Af Amer: >90 mL/min (ref >90)
Glucose, Bld: 130 mg/dL — ABNORMAL HIGH (ref 70–99)
Potassium: 3.8 mEq/L (ref 3.5–5.1)
Sodium: 139 mEq/L (ref 135–145)

## 2012-08-11 MED ORDER — ACETAMINOPHEN 10 MG/ML IV SOLN
1000.0000 mg | Freq: Four times a day (QID) | INTRAVENOUS | Status: AC
  Administered 2012-08-11 – 2012-08-12 (×4): 1000 mg via INTRAVENOUS
  Filled 2012-08-11 (×4): qty 100

## 2012-08-11 NOTE — Progress Notes (Signed)
I agree with the following treatment note after reviewing documentation.   Johnston, Ludwin Flahive Brynn   OTR/L Pager: [phone] Office: [phone] .   

## 2012-08-11 NOTE — Progress Notes (Signed)
Patient ID: Jodi Duffy, female   DOB: 1951-05-21, 61 y.o.   MRN: [id number] PATIENT ID: Jodi Duffy        MRN:  [id number]          DOB/AGE: 06/11/1951 / 61 y.o.    Jodi Campbell, MD   Jacqualine Code, PA-C [address] [place], [place]  [address]                             [phone]   PROGRESS NOTE  Subjective:  negative for Chest Pain  negative for Shortness of Breath  negative for Nausea/Vomiting   negative for Calf Pain    Tolerating Diet: yes         Patient reports pain as mild.     Good night with minimal discomfort, sitting up eating breakfast without nausea, VS stable  Objective: Vital signs in last 24 hours:   Patient Vitals for the past 24 hrs:  BP Temp Pulse Resp SpO2  08/11/12 0628 128/68 mmHg 98.5 F (36.9 C) 100  16  96 %  08/11/12 0229 121/67 mmHg 98.3 F (36.8 C) 98  16  96 %  08/10/12 2107 150/69 mmHg 98.4 F (36.9 C) 82  20  96 %  08/10/12 1259 171/88 mmHg 98.6 F (37 C) 80  20  98 %  08/10/12 1238 - - 92  24  95 %  08/10/12 1237 - - 88  19  98 %  08/10/12 1236 - - 83  17  100 %  08/10/12 1235 157/92 mmHg - 84  14  100 %  08/10/12 1234 - - 84  17  100 %  08/10/12 1233 - - 88  23  99 %  08/10/12 1232 - - 84  19  100 %  08/10/12 1231 - - 76  14  100 %  08/10/12 1230 - - 78  16  100 %  08/10/12 1229 - - 81  15  100 %  08/10/12 1228 - - 81  19  100 %  08/10/12 1227 - - 80  13  100 %  08/10/12 1226 - - 82  14  100 %  08/10/12 1225 - - 73  13  100 %  08/10/12 1224 - - 76  13  100 %  08/10/12 1223 - - 74  17  100 %  08/10/12 1222 - - 79  13  100 %  08/10/12 1221 - - 77  13  100 %  08/10/12 1220 150/94 mmHg - 75  15  100 %  08/10/12 1219 - - 77  16  100 %  08/10/12 1218 - - 77  16  100 %  08/10/12 1217 - - 78  17  100 %  08/10/12 1216 - - 75  16  100 %  08/10/12 1215 - 98.2 F (36.8 C) 78  18  100 %  08/10/12 1214 - - 80  18  100 %  08/10/12 1213 - - 72  19  100 %  08/10/12 1212 - - 82  22  100 %  08/10/12 1211 - - 74  19  100  %  08/10/12 1210 - - 70  17  100 %  08/10/12 1209 - - 75  17  100 %  08/10/12 1208 - - 75  16  100 %  08/10/12 1207 - - 74  19  100 %  08/10/12 1206 - -  71  18  100 %  08/10/12 1205 145/119 mmHg - 73  15  100 %  08/10/12 1204 - - 72  18  100 %  08/10/12 1203 - - 71  18  100 %  08/10/12 1202 - - 77  16  100 %  08/10/12 1201 - - 77  18  100 %  08/10/12 1200 - - 78  16  100 %  08/10/12 1159 - - 84  17  100 %  08/10/12 1158 - - 70  13  100 %  08/10/12 1157 - - 76  16  100 %  08/10/12 1156 - - 75  12  100 %  08/10/12 1155 - - 74  15  100 %  08/10/12 1154 - - 73  15  100 %  08/10/12 1153 - - 82  18  99 %  08/10/12 1152 138/88 mmHg - 73  14  98 %  08/10/12 1151 - - 73  13  100 %  08/10/12 1150 - - 76  18  100 %  08/10/12 1149 - - 71  14  100 %  08/10/12 1148 - - 75  14  100 %  08/10/12 1147 - - 71  18  98 %  08/10/12 1146 - - 72  16  98 %  08/10/12 1145 - - 74  15  100 %  08/10/12 1144 - - 73  17  100 %  08/10/12 1143 - - 73  18  100 %  08/10/12 1142 - - 71  18  100 %  08/10/12 1141 - - 76  14  100 %  08/10/12 1140 - - 71  13  100 %  08/10/12 1139 - - 73  17  99 %  08/10/12 1138 - - 75  13  100 %  08/10/12 1137 - - 86  13  100 %  08/10/12 1136 - - 75  15  99 %  08/10/12 1135 150/86 mmHg - 80  14  95 %  08/10/12 1134 - - 81  19  99 %  08/10/12 1133 - - 79  16  97 %  08/10/12 1132 - - 79  28  97 %  08/10/12 1131 - - 71  23  100 %  08/10/12 1130 - - 68  28  100 %  08/10/12 1129 - - 69  30  100 %  08/10/12 1128 - - 71  31  100 %  08/10/12 1127 - - 70  30  100 %  08/10/12 1126 - - 68  41  100 %  08/10/12 1125 - - 73  27  100 %  08/10/12 1124 - - 68  21  100 %  08/10/12 1123 - - 70  18  100 %  08/10/12 1122 - - 69  35  100 %  08/10/12 1121 - - 73  25  100 %  08/10/12 1120 172/79 mmHg - 77  21  100 %  08/10/12 1119 - - 70  29  100 %  08/10/12 1118 - - 70  25  100 %  08/10/12 1117 - - 74  24  100 %  08/10/12 1116 - - 74  41  100 %  08/10/12 1115 172/79 mmHg - 75  27  99 %    08/10/12 1114 - - 72  22  100 %  08/10/12 1113 - - 74  24  97 %  08/10/12 1112 - -  76  23  99 %  08/10/12 1111 - - 79  22  99 %  08/10/12 1110 - - 77  28  100 %  08/10/12 1109 - - 76  19  100 %  08/10/12 1108 - - 72  22  98 %  08/10/12 1107 - - 75  27  100 %  08/10/12 1106 - - 77  23  100 %  08/10/12 1105 139/88 mmHg - 76  30  100 %  08/10/12 1104 - - 73  33  100 %  08/10/12 1103 - - 80  28  100 %  08/10/12 1102 - - 76  27  100 %  08/10/12 1101 - - 74  31  100 %  08/10/12 1100 139/88 mmHg - 80  33  100 %  08/10/12 1059 - - 80  17  100 %  08/10/12 1058 - - 80  30  100 %  08/10/12 1057 - - 71  53  100 %  08/10/12 1056 - - 71  42  100 %  08/10/12 1055 - - 70  42  100 %  08/10/12 1054 - - 77  33  100 %  08/10/12 1053 - - 82  25  100 %  08/10/12 1052 - - 72  43  100 %  08/10/12 1051 - - 71  44  100 %  08/10/12 1050 174/80 mmHg - 74  47  100 %  08/10/12 1049 - - 72  46  100 %  08/10/12 1048 - - 68  33  100 %  08/10/12 1047 - - 72  38  100 %  08/10/12 1046 - - 79  17  100 %  08/10/12 1045 174/80 mmHg - 79  24  100 %  08/10/12 1044 - - 74  49  100 %  08/10/12 1043 - - 81  39  94 %  08/10/12 1042 - - 82  48  90 %  08/10/12 1041 - - 91  32  97 %  08/10/12 1040 - - 91  27  92 %  08/10/12 1039 - - 73  29  100 %  08/10/12 1038 - - 72  34  100 %  08/10/12 1037 - - 73  35  100 %  08/10/12 1036 - - 78  43  100 %  08/10/12 1035 182/84 mmHg - 87  36  100 %  08/10/12 1030 182/48 mmHg 97.4 F (36.3 C) - - -      Intake/Output from previous day:   09/24 0701 - 09/25 0700 In: 2250 [I.V.:2250] Out: 5150 [Urine:4350; Drains:675]   Intake/Output this shift:       Intake/Output      09/24 0701 - 09/25 0700 09/25 0701 - 09/26 0700   I.V. 2250    Total Intake 2250    Urine 4350    Drains 675    Blood 125    Total Output 5150    Net -2900            LABORATORY DATA:  Basename 08/11/12 0617 08/05/12 0943  WBC 6.4 5.0  HGB 9.4* 13.2  HCT 28.2* 39.8  PLT 196 262    Basename  08/05/12 0943  NA 138  K 3.5  CL 102  CO2 29  BUN 19  CREATININE 0.57  GLUCOSE 108*  CALCIUM 9.3   Lab Results  Component Value Date   INR 1.04 08/05/2012    Recent Radiographic  Studies :   Chest 2 View  08/05/2012  *RADIOLOGY REPORT*  Clinical Data: Preop for right knee arthroplasty  CHEST - 2 VIEW  Comparison: None  Findings: Cardiomediastinal silhouette is unremarkable.  Mild thoracic dextroscoliosis.  No acute infiltrate or pulmonary edema. Old fracture of the left ninth rib. Mild degenerative changes thoracic spine.  IMPRESSION: No active disease.  Mild dextroscoliosis and mild degenerative changes thoracic spine.   Original Report Authenticated By: Natasha Mead, M.D.      Examination:  General appearance: alert, cooperative and no distress  Wound Exam: clean, dry, intact   Drainage:  hemovac with greater than 200cc- will leave in place  Motor Exam: EHL, FHL, Anterior Tibial and Posterior Tibial Intact  Sensory Exam: Superficial Peroneal, Deep Peroneal and Tibial normal  Vascular Exam: Normal  Assessment:    1 Day Post-Op  Procedure(s) (LRB): TOTAL KNEE ARTHROPLASTY (Right)  ADDITIONAL DIAGNOSIS:  Active Problems:  * No active hospital problems. *   Acute Blood Loss Anemia-asymptomatic   Plan: Physical Therapy as ordered Partial Weight Bearing @ 50% (PWB)  DVT Prophylaxis:  Xarelto  DISCHARGE PLAN: Home  DISCHARGE NEEDS: HHPT, CPM and 3-in-1 comode seat    Foley out, femoral nerve block still in effect-comfortable  OOB with PT, monitor lab     Jodi Duffy 08/11/2012 7:30 AM

## 2012-08-11 NOTE — Progress Notes (Signed)
Physical Therapy Treatment Patient Details Name: Jodi Duffy MRN: [id number] DOB: 1951/05/11 Today's Date: 08/11/2012 Time: 4098-1191 PT Time Calculation (min): 35 min  PT Assessment / Plan / Recommendation Comments on Treatment Session  Pt progressed well with ambulation today and was left in CPM 0-70 degrees at 1030. Recommend continue acute PT and f/u with HHPT.    Follow Up Recommendations  Home health PT    Barriers to Discharge        Equipment Recommendations  None recommended by PT    Recommendations for Other Services OT consult  Frequency 7X/week   Plan Discharge plan remains appropriate;Frequency remains appropriate    Precautions / Restrictions Precautions Precautions: Knee Precaution Booklet Issued: No Precaution Comments: pt verbalizes need for nothing under the knee Restrictions Weight Bearing Restrictions: Yes RLE Weight Bearing: Partial weight bearing RLE Partial Weight Bearing Percentage or Pounds: 50%   Pertinent Vitals/Pain 4/10 right knee, RN gave meds    Mobility  Bed Mobility Bed Mobility: Sit to Supine Sit to Supine: 5: Supervision Details for Bed Mobility Assistance: pt able to get leg into the bed on her own today Transfers Transfers: Sit to Stand;Stand to Sit Sit to Stand: From chair/3-in-1;5: Supervision;With armrests Stand to Sit: 5: Supervision;To bed;With upper extremity assist Details for Transfer Assistance: pt able to perform transfers without physical assist with safe placement of hands Ambulation/Gait Ambulation/Gait Assistance: 4: Min guard Ambulation Distance (Feet): 80 Feet Assistive device: Rolling walker Ambulation/Gait Assistance Details: vc's for sequencing to decrease pain and frequent vc's for pt to look out ahead instead of down at feet.  Verbal cues for relaxing shoulders. Gait Pattern: Step-to pattern Stairs: No Wheelchair Mobility Wheelchair Mobility: No    Exercises Total Joint Exercises Ankle Circles/Pumps:  AROM;Strengthening;Both;10 reps;Seated Quad Sets: AROM;Strengthening;10 reps;Seated;Both Heel Slides: AAROM;Right;Seated;5 reps Hip ABduction/ADduction: AROM;Right;10 reps;Seated Straight Leg Raises: AROM;Right;10 reps;Seated Long Arc Quad: AROM;Right;10 reps;Seated   PT Diagnosis:    PT Problem List:   PT Treatment Interventions:     PT Goals Acute Rehab PT Goals PT Goal Formulation: With patient Time For Goal Achievement: 08/13/12 Potential to Achieve Goals: Good Pt will go Supine/Side to Sit: with supervision Pt will go Sit to Supine/Side: with modified independence PT Goal: Sit to Supine/Side - Progress: Updated due to goal met Pt will go Sit to Stand: with modified independence PT Goal: Sit to Stand - Progress: Updated due to goal met Pt will Ambulate: 51 - 150 feet;with supervision;with least restrictive assistive device PT Goal: Ambulate - Progress: Progressing toward goal Pt will Go Up / Down Stairs: 1-2 stairs;with supervision;with least restrictive assistive device Pt will Perform Home Exercise Program: with supervision, verbal cues required/provided PT Goal: Perform Home Exercise Program - Progress: Progressing toward goal  Visit Information  Last PT Received On: 08/11/12 Assistance Needed: +1    Subjective Data  Subjective: can I go to the bathroom with my husband? Patient Stated Goal: to get home and back to PLOF   Cognition  Overall Cognitive Status: Appears within functional limits for tasks assessed/performed Arousal/Alertness: Awake/alert Orientation Level: Oriented X4 / Intact Behavior During Session: [facility] for tasks performed    Balance  Balance Balance Assessed: No  End of Session PT - End of Session Equipment Utilized During Treatment: Gait belt Activity Tolerance: Patient tolerated treatment well Patient left: in CPM;with call bell/phone within reach (0-70 degrees) Nurse Communication: Mobility status   GP   Lyanne Co, PT  Acute Rehab  Services  [phone]   [place],  [place] 08/11/2012, 10:53 AM

## 2012-08-11 NOTE — Op Note (Signed)
[name], BATRA NO.:  [id number]  MEDICAL RECORD NO.:  [id number]  LOCATION:  [id number]                        FACILITY:  [facility]  PHYSICIAN:  Claude Manges. Miyoshi Ligas, M.D.DATE OF BIRTH:  1951/05/22  DATE OF PROCEDURE:  08/10/2012 DATE OF DISCHARGE:                              OPERATIVE REPORT   PREOPERATIVE DIAGNOSIS:  End-stage osteoarthritis, right knee.  POSTOPERATIVE DIAGNOSIS:  End-stage osteoarthritis, right knee.  PROCEDURE:  Right total knee replacement.  SURGEON:  Claude Manges. Cleophas Dunker, MD  ASSISTANT:  Oris Drone. Petrarca, PA-C  ANESTHESIA:  General supplemental femoral nerve block.  COMPLICATIONS:  None.  COMPONENTS:  DePuy LCS standard femoral component, a #3 rotating keeled tibial tray, a 10-mm polyethylene bridging bearing, a 3-peg metal back rotating patella.  All components were secured with polymethyl methacrylate.  PROCEDURE:  Ms. Jodi Duffy was met in the holding area, identified the right knee as the appropriate operative site and marked it.  She did receive a preoperative right femoral nerve block.  The patient was then transported to room #7 and placed under general anesthesia without difficulty.  Nursing staff inserted a Foley catheter.  Urine was clear.  The right lower extremity was then prepped with Betadine scrub and the DuraPrep from thigh tourniquet to the midfoot.  Sterile draping was performed.  With the extremity still elevated, it was Esmarch exsanguinated with a proximal tourniquet at 350 mmHg.  A midline longitudinal incision was made, centered about the patella extending from the superior pouch to the tibial tubercle.  Via sharp dissection, incision was carried down to the subcutaneous tissue.  First layer of capsule was incised in the midline.  A medial parapatellar incision was made with the Bovie.  The patella was then everted at 180 degrees laterally, the knee flexed to 90 degrees.  There was a valgus knee.  There was  complete absence of articular cartilage in the lateral femoral condyle and to a greater extent to the lateral tibial plateau. The patient had a prior partial lateral meniscectomy.  There were moderate osteophytes along the medial and lateral femoral condyle and moderate amount of beefy-red synovitis.  Synovectomy was performed. Osteophytes were removed.  I measured a standard femoral component.  First, bony cut was then made transversely in the proximal tibia using the external tibial guide, thought the bone was relatively soft. Subsequent cuts were then made on the femur using 10-mm flexion and extension gaps, which were symmetric at least a 4-degree distal femoral valgus cut.  The knee was tight laterally because of the valgus position, the popliteus was incised.  I released the lateral capsule from the tibia.  At that point, I thought we had excellent alignment at each bony cut.  I did use the external tibial guide with central location of all the components.  Laminar spreaders were then inserted to the joint to remove remaining lateral meniscus and medial meniscus, ACL and PCL.  A 0.25-inch osteotome was used to remove osteophytes from the posterior femoral condyle.  There was a Baker cyst posterior medially, which was resected. I used the Bovie to remove any of the soft tissue to help with hemostasis.  I was very careful about obtaining symmetrical tautness  of the medial and lateral compartments and took a little extra time to be sure that our flexion and extension gaps were perfectly symmetrical.  Final cut was then made on the femur for tapering into the center hole.  Retractors were then placed around the tibia.  The tibia was advanced anteriorly, measured a tibial tray, which appeared to be a perfect fit. Center hole was made by the keeled cut with the tibial tray still in place.  A 10-mm bridging bearing was applied followed by the standard femoral component.  The entire  joint was then reduced and through a full range of motion, we had full extension and flexion.  We did not have any opening with varus or valgus stressing.  I thought excellent and perfect alignment with our external guide.  Patella was prepared by removing approximately 8 mm of bone leaving 13 mm of patella thickness.  The 3-holed jig was then applied followed by the trial patella.  Through a full range of motion, the patella would not sublux.  Trial components were then removed.  The joint was copiously irrigated with saline solution.  The final components were then impacted with polymethyl methacrylate.  I initially applied the tibial tray followed by the 10-mm polyethylene bridging bearing, then the standard femoral component.  The knee was then reduced with compaction of the components and extraneous methacrylate was removed from the periphery of the components.  The patella was then applied with methacrylate and patellar clamp.  We then again checked for any extraneous methacrylate.  At approximately 16 minutes, the methacrylate had hardened, the joint was then explored without any further extraneous methacrylate.  I injected the deep capsule with 0.25% Marcaine with epinephrine.  Tourniquet was then released at 98 minutes.  We had very nice capillary refill to the joint surface.  Bovie was used to obtain hemostasis with a nice dry field at that point.  A medium-size Hemovac was inserted.  The joint was again irrigated with saline solution.  Deep capsule was closed with interrupted #1 Ethibond, superficial capsule was closed with running 0 Vicryl, subcu with 3-0 Monocryl.  Skin was closed with skin clips.  Sterile bulky dressing was applied followed by the patient's support stocking.  The patient tolerated the procedure well without complications.     Claude Manges. Cleophas Dunker, M.D.     PWW/MEDQ  D:  08/10/2012  T:  08/11/2012  Job:  [id number]

## 2012-08-11 NOTE — Progress Notes (Signed)
Physical Therapy Treatment Patient Details Name: Jodi Duffy MRN: [id number] DOB: 1951-11-11 Today's Date: 08/11/2012 Time: 7829-5621 PT Time Calculation (min): 32 min  PT Assessment / Plan / Recommendation Comments on Treatment Session  Pt continues to progress well with mobility, practiced one step with RW and min-guard A. PT will continue to follow.    Follow Up Recommendations  Home health PT    Barriers to Discharge        Equipment Recommendations  None recommended by PT;None recommended by OT    Recommendations for Other Services    Frequency 7X/week   Plan Discharge plan remains appropriate;Frequency remains appropriate    Precautions / Restrictions Precautions Precautions: Knee Precaution Booklet Issued: No Restrictions Weight Bearing Restrictions: Yes RLE Weight Bearing: Partial weight bearing RLE Partial Weight Bearing Percentage or Pounds: 50%   Pertinent Vitals/Pain Pt continues to describe incisional "stinging" type pain    Mobility  Bed Mobility Bed Mobility: Sit to Supine Sit to Supine: 6: Modified independent (Device/Increase time);HOB flat Transfers Transfers: Sit to Stand;Stand to Sit Sit to Stand: 6: Modified independent (Device/Increase time);From toilet;From chair/3-in-1;With armrests Stand to Sit: 6: Modified independent (Device/Increase time);To toilet;To bed;With upper extremity assist Details for Transfer Assistance: no assist needed for transfers Ambulation/Gait Ambulation/Gait Assistance: 5: Supervision Ambulation Distance (Feet): 90 Feet Assistive device: Rolling walker Ambulation/Gait Assistance Details: pt continues to need vc's to not look down.  Pt given shorter RW which helped decrease tension in shoulders.  Review of 50% WB'ing status. Gait Pattern: Step-to pattern Gait velocity: decreased Stairs: Yes Stairs Assistance: 4: Min guard Stair Management Technique: No rails;Forwards;Step to pattern;With walker Number of Stairs: 1    Wheelchair Mobility Wheelchair Mobility: No    Exercises Total Joint Exercises Ankle Circles/Pumps: AROM;Strengthening;Both;10 reps;Seated Quad Sets: AROM;Strengthening;10 reps;Seated;Both Short Arc Quad: AROM;Right;10 reps;Seated Heel Slides: AROM;Right;10 reps;Seated Straight Leg Raises: AROM;Right;10 reps;Seated Long Arc Quad: AROM;10 reps;Right;Seated   PT Diagnosis:    PT Problem List:   PT Treatment Interventions:     PT Goals Acute Rehab PT Goals PT Goal Formulation: With patient Time For Goal Achievement: 08/13/12 Potential to Achieve Goals: Good Pt will go Supine/Side to Sit: with modified independence;with HOB 0 degrees PT Goal: Supine/Side to Sit - Progress: Updated due to goal met Pt will go Sit to Supine/Side: with modified independence PT Goal: Sit to Supine/Side - Progress: Met Pt will go Sit to Stand: with modified independence PT Goal: Sit to Stand - Progress: Met Pt will Ambulate: 51 - 150 feet;with supervision;with least restrictive assistive device PT Goal: Ambulate - Progress: Progressing toward goal Pt will Go Up / Down Stairs: 1-2 stairs;with supervision;with least restrictive assistive device PT Goal: Up/Down Stairs - Progress: Progressing toward goal Pt will Perform Home Exercise Program: with supervision, verbal cues required/provided PT Goal: Perform Home Exercise Program - Progress: Progressing toward goal  Visit Information  Last PT Received On: 08/11/12 Assistance Needed: +1    Subjective Data  Subjective: I walked with my son and am ready to get back into the CPM Patient Stated Goal: to get home and back to PLOF   Cognition  Overall Cognitive Status: Appears within functional limits for tasks assessed/performed Arousal/Alertness: Awake/alert Orientation Level: Oriented X4 / Intact Behavior During Session: [facility] for tasks performed    Balance  Balance Balance Assessed: Yes Dynamic Standing Balance Dynamic Standing - Balance Support:  During functional activity;No upper extremity supported Dynamic Standing - Level of Assistance: 5: Stand by assistance  End of Session PT -  End of Session Equipment Utilized During Treatment: Gait belt Activity Tolerance: Patient tolerated treatment well Patient left: in bed;in CPM;with call bell/phone within reach (CPM 0-75 degrees) Nurse Communication: Mobility status   GP   Lyanne Co, PT  Acute Rehab Services  [phone]   Lyanne Co 08/11/2012, 3:54 PM

## 2012-08-11 NOTE — Progress Notes (Signed)
Occupational Therapy Evaluation Patient Details Name: Jodi Duffy MRN: [id number] DOB: 11/13/1951 Today's Date: 08/11/2012 Time: 4098-1191 OT Time Calculation (min): 13 min  OT Assessment / Plan / Recommendation Clinical Impression  [name] s/p right TKA. Pt demonstrates ability to complete BADL tasks with supervision or modified independence. No acute OT needs at this time    OT Assessment  Patient does not need any further OT services    Follow Up Recommendations  No OT follow up    Barriers to Discharge      Equipment Recommendations  None recommended by OT    Recommendations for Other Services    Frequency       Precautions / Restrictions Precautions Precautions: Knee Precaution Booklet Issued: No Precaution Comments: pt verbalizes need for nothing under the knee Restrictions Weight Bearing Restrictions: Yes RLE Weight Bearing: Partial weight bearing RLE Partial Weight Bearing Percentage or Pounds: 50%   Pertinent Vitals/Pain Pt reports 0/10 for pain     ADL  Grooming: Performed;Wash/dry hands;Teeth care;Supervision/safety Where Assessed - Grooming: Unsupported standing Toilet Transfer: Performed;Modified independent Toilet Transfer Method: Sit to Barista: Regular height toilet;Grab bars Toileting - Clothing Manipulation and Hygiene: Performed;Independent Where Assessed - Toileting Clothing Manipulation and Hygiene: Sit to stand from 3-in-1 or toilet Tub/Shower Transfer: Simulated;Supervision/safety Tub/Shower Transfer Method: Science writer: Walk in shower Equipment Used: Rolling walker Transfers/Ambulation Related to ADLs: Pt. requires cues for proper sequencing with RW and to stay within the RW while ambulating. Pt. impulsive with transfers and ambulation and requires cueing for safety  ADL Comments: Pt. able to complete BADL's with supervision for safety. Pt. needs constant verbal cueing for safe  use of RW    OT Diagnosis:    OT Problem List:   OT Treatment Interventions:     OT Goals    Visit Information  Last OT Received On: 08/11/12 Assistance Needed: +1    Subjective Data  Subjective: I am doing really well today  Patient Stated Goal: To get back home   Prior Functioning     Home Living Lives With: Spouse Available Help at Discharge: Family Type of Home: House Home Access: Stairs to enter [employer] of Steps: 1 Entrance Stairs-Rails: None Home Layout: Multi-level;Able to live on main level with bedroom/bathroom Bathroom Shower/Tub: Door;Walk-in Stage manager: Standard Home Adaptive Equipment: Bedside commode/3-in-1;Walker - rolling;Straight cane Prior Function Level of Independence: Independent Able to Take Stairs?: Yes Driving: Yes Communication Communication: No difficulties         Vision/Perception     Cognition  Overall Cognitive Status: Appears within functional limits for tasks assessed/performed Arousal/Alertness: Awake/alert Orientation Level: Oriented X4 / Intact Behavior During Session: [facility] for tasks performed    Extremity/Trunk Assessment Right Upper Extremity Assessment RUE ROM/Strength/Tone: [facility] for tasks assessed Left Upper Extremity Assessment LUE ROM/Strength/Tone: WFL for tasks assessed     Mobility Bed Mobility Bed Mobility: Not assessed Sit to Supine: 5: Supervision Details for Bed Mobility Assistance: pt able to get leg into the bed on her own today Transfers Transfers: Sit to Stand;Stand to Sit Sit to Stand: 5: Supervision;From toilet;With upper extremity assist Stand to Sit: 5: Supervision;To toilet;With upper extremity assist Details for Transfer Assistance: Pt. independent for transfers only requiring verbal cues for hand placement           Exercise Total Joint Exercises Ankle Circles/Pumps: AROM;Strengthening;Both;10 reps;Seated Quad Sets: AROM;Strengthening;10 reps;Seated;Both Heel  Slides: AAROM;Right;Seated;5 reps Hip ABduction/ADduction: AROM;Right;10 reps;Seated Straight Leg Raises:  AROM;Right;10 reps;Seated Long Arc Quad: AROM;Right;10 reps;Seated   Balance Balance Balance Assessed: No   End of Session OT - End of Session Activity Tolerance: Patient tolerated treatment well Patient left: in chair;with call bell/phone within reach;with family/visitor present Nurse Communication: Mobility status  GO     Cleora Fleet 08/11/2012, 2:35 PM

## 2012-08-11 NOTE — Progress Notes (Signed)
CARE MANAGEMENT NOTE 08/11/2012  Patient:  Jodi Duffy, Jodi Duffy   Account Number:  [id number]  Date Initiated:  08/11/2012  Documentation initiated by:  Vance Peper  Subjective/Objective Assessment:   61 yr old female s/p right total knee arthroplasty.     Action/Plan:   CM spoke with patient regarding home health and DME. Patient preoperatively setup with [facility], no change. Rolling walker, 3in1 and CPM have been delivered to her home.   Anticipated DC Date:  08/12/2012   Anticipated DC Plan:  HOME W HOME HEALTH SERVICES      DC Planning Services  CM consult      [facility] Choice  HOME HEALTH   Choice offered to / List presented to:  C-1 Patient        HH arranged  HH-2 PT      [facility] agency  [facility]   Status of service:  Completed, signed off Medicare Important Message given?   (If response is "NO", the following Medicare IM given date fields will be blank) Date Medicare IM given:   Date Additional Medicare IM given:    Discharge Disposition:  HOME W HOME HEALTH SERVICES  Per UR Regulation:    If discussed at Long Length of Stay Meetings, dates discussed:    Comments:

## 2012-08-11 NOTE — Progress Notes (Signed)
I agree with the following treatment note after reviewing documentation.   Johnston, Salik Grewell Brynn   OTR/L Pager: [phone] Office: [phone] .   

## 2012-08-11 NOTE — Progress Notes (Signed)
Occupational Therapy Discharge Patient Details Name: Jodi Duffy MRN: [id number] DOB: [birth year] Today's Date: 08/11/2012 Time: 4696-2952 OT Time Calculation (min): 13 min  Patient discharged from OT services secondary to ability to demonstrate BADL's with supervision or modified independence. No acute OT needs at this time.  Please see latest therapy progress note for current level of functioning and progress toward goals.    Progress and discharge plan discussed with patient and/or caregiver: Patient/Caregiver agrees with plan  GO     Cleora Fleet 08/11/2012, 2:36 PM

## 2012-08-11 NOTE — Progress Notes (Signed)
Pt c/o chest pain at 2121, EKG was done, which showed NSR. Vital signs were WNL. Pt stated after a few mins that there was no more pain. Will continue to monitor.

## 2012-08-11 NOTE — Progress Notes (Signed)
Utilization review completed. Makalah Asberry, RN, BSN. 

## 2012-08-12 DIAGNOSIS — D62 Acute posthemorrhagic anemia: Secondary | ICD-10-CM | POA: Diagnosis not present

## 2012-08-12 DIAGNOSIS — Z96659 Presence of unspecified artificial knee joint: Secondary | ICD-10-CM | POA: Diagnosis present

## 2012-08-12 LAB — CBC
Hemoglobin: 8.9 g/dL — ABNORMAL LOW (ref 12.0–15.0)
MCH: 30.2 pg (ref 26.0–34.0)
RBC: 2.95 MIL/uL — ABNORMAL LOW (ref 3.87–5.11)
WBC: 7.5 10*3/uL (ref 4.0–10.5)

## 2012-08-12 LAB — BASIC METABOLIC PANEL
CO2: 26 mEq/L (ref 19–32)
Calcium: 8.8 mg/dL (ref 8.4–10.5)
Chloride: 105 mEq/L (ref 96–112)
Glucose, Bld: 134 mg/dL — ABNORMAL HIGH (ref 70–99)
Sodium: 140 mEq/L (ref 135–145)

## 2012-08-12 MED ORDER — OXYCODONE HCL 5 MG PO TABS: 5.0000 mg | ORAL_TABLET | ORAL | Status: DC | PRN

## 2012-08-12 MED ORDER — RIVAROXABAN 10 MG PO TABS: 10.0000 mg | ORAL_TABLET | Freq: Every day | ORAL | Status: DC

## 2012-08-12 MED ORDER — METHOCARBAMOL 500 MG PO TABS: 500.0000 mg | ORAL_TABLET | Freq: Four times a day (QID) | ORAL | Status: DC | PRN

## 2012-08-12 MED ORDER — ONDANSETRON HCL 4 MG PO TABS: 4.0000 mg | ORAL_TABLET | Freq: Four times a day (QID) | ORAL | Status: DC | PRN

## 2012-08-12 NOTE — Discharge Summary (Signed)
[facility] Jodi Campbell, MD   Jodi Code, PA-C [address] [place], [place]  [address]                             [phone]  PATIENT ID: Jodi Duffy        MRN:  [id number]          DOB/AGE: 07/20/1951 / 61 y.o.    DISCHARGE SUMMARY  ADMISSION DATE:    08/10/2012 DISCHARGE DATE:   08/12/2012   ADMISSION DIAGNOSIS: Osteoarthritis Right Knee    DISCHARGE DIAGNOSIS:  Osteoarthritis Right Knee    ADDITIONAL DIAGNOSIS: Principal Problem:  *Osteoarthritis of knee Active Problems:  Postoperative anemia due to acute blood loss asymptomatic  Past Medical History  Diagnosis Date  . Allergy     RHINITIS  . Depression   . TMJ syndrome   . Osteopenia   . Arthritis     PROCEDURE:Right Procedure(s): TOTAL KNEE ARTHROPLASTY on 08/10/2012  CONSULTS: none     HISTORY: Jodi Duffy is a very pleasant 61 year old white female who is seen today for evaluation of her right knee. Jodi Duffy was initially seen back in October 2012 with a 4-week history of right knee pain, which was sudden onset. Apparently, she was just getting up 1 evening and performed either bending or stooping or squatting or twisting injury to her right knee. She had this fairly moderate pain, which is more of an aching quality at that time. There was still certain positions that she had a catching feeling. Previous history of a right knee arthroscopy 10 years previously for a tear of the lateral meniscus and had done well up until the 4 weeks ago. She had a corticosteroid injection in October, which lasted only for several days. Because of this an MRI was ordered and an arthroscopic debridement was obtained that of a removal of a lateral loose body and a lateral meniscectomy. She continued to have symptoms in the knee and after surgery. She is about 3 months post arthroscopic debridement and was noted to have bone on bone of the lateral compartment. Corticosteroid injection was given in February the 13 as well as March the  25. This apparently did not have as much of a benefit. It was then felt that the use of Viscosupplementation would be entertained and she underwent 5 Hyalgan injections. She states that this really did not do much in regards to her pain management or improvement in her symptoms. She continues to have pain, discomfort and had undergone another corticosteroid injection in May. Her only other option because of her consistently painful knee was to consider a total joint replacement. S   HOSPITAL COURSE:  Jodi Duffy is a 61 y.o. admitted on 08/10/2012 and found to have a diagnosis of Osteoarthritis Right Knee.  After appropriate laboratory studies were obtained  they were taken to the operating room on 08/10/2012 and underwent Right Procedure(s): TOTAL KNEE ARTHROPLASTY.   They were given perioperative antibiotics:  Anti-infectives     Start     Dose/Rate Route Frequency Ordered Stop   08/10/12 1900   vancomycin (VANCOCIN) IVPB 1000 mg/200 mL premix        1,000 mg 200 mL/hr over 60 Minutes Intravenous Every 12 hours 08/10/12 1307 08/10/12 2158   08/09/12 1423   vancomycin (VANCOCIN) IVPB 1000 mg/200 mL premix        1,000 mg 200 mL/hr over 60 Minutes Intravenous 60 min pre-op 08/09/12  1423 08/10/12 0713        .  Tolerated the procedure well.  Placed with a foley intraoperatively.  Given Ofirmev at induction and for 48 hours.    POD #1, allowed out of bed to a chair.  PT for ambulation and exercise program.  Foley D/C'd in morning.  IV saline locked.  O2 discontionued.  POD #2, continued PT and ambulation.  Hemovac pulled.  Nausea last evening but improved with meds. Slept well last night.  Wants to go home today  The remainder of the hospital course was dedicated to ambulation and strengthening.   The patient was discharged on 2 Days Post-Op in  Stable condition.  Blood products given:none  DIAGNOSTIC STUDIES: Recent vital signs: Patient Vitals for the past 24 hrs:  BP Temp Pulse  Resp SpO2 Height Weight  08/12/12 0300 129/59 mmHg 99.1 F (37.3 C) 75  18  94 % - -  08-12-12 2100 133/77 mmHg 99.8 F (37.7 C) 94  18  93 % - -  Aug 12, 2012 1329 119/57 mmHg 99.5 F (37.5 C) 101  18  95 % - -  Aug 12, 2012 1300 - - - - - 5\' 3"  (1.6 m) 65.318 kg (144 lb)       Recent laboratory studies:  Basename 08/12/12 0535 08/12/2012 0617  WBC 7.5 6.4  HGB 8.9* 9.4*  HCT 26.9* 28.2*  PLT 188 196    Basename 08/12/12 0535 08-12-2012 0617  NA 140 139  K 4.1 3.8  CL 105 103  CO2 26 28  BUN 7 9  CREATININE 0.50 0.59  GLUCOSE 134* 130*  CALCIUM 8.8 8.6   Lab Results  Component Value Date   INR 1.04 08/05/2012     Recent Radiographic Studies :   Chest 2 View  08/05/2012  *RADIOLOGY REPORT*  Clinical Data: Preop for right knee arthroplasty  CHEST - 2 VIEW  Comparison: None  Findings: Cardiomediastinal silhouette is unremarkable.  Mild thoracic dextroscoliosis.  No acute infiltrate or pulmonary edema. Old fracture of the left ninth rib. Mild degenerative changes thoracic spine.  IMPRESSION: No active disease.  Mild dextroscoliosis and mild degenerative changes thoracic spine.   Original Report Authenticated By: Natasha Mead, M.D.     DISCHARGE INSTRUCTIONS: Discharge Orders    Future Orders Please Complete By Expires   Diet general      Call MD / Call 911      Comments:   If you experience chest pain or shortness of breath, CALL 911 and be transported to the hospital emergency room.  If you develope a fever above 101 F, pus (white drainage) or increased drainage or redness at the wound, or calf pain, call your surgeon's office.   Constipation Prevention      Comments:   Drink plenty of fluids.  Prune juice may be helpful.  You may use a stool softener, such as Colace (over the counter) 100 mg twice a day.  Use MiraLax (over the counter) for constipation as needed.   Increase activity slowly as tolerated      Patient may shower      Comments:   You may shower over plastic dressing.   Once changed to 4x4 gauze then may shower without a dressing once there is no drainage.  Do not wash over the wound.  If drainage remains, cover wound with plastic wrap and then shower.   Partial weight bearing      Comments:   50 % WEIGHT BEARING AS TAUGHT  IN PHYSICAL THERAPY   Driving restrictions      Comments:   No driving for 6 weeks   Lifting restrictions      Comments:   No lifting for 6 weeks   CPM      Comments:   Continuous passive motion machine (CPM):      Use the CPM from 0 to 70 for 6-8 hours per day.      You may increase by 5-10 per day.  You may break it up into 2 or 3 sessions per day.      Use CPM for 3-4 weeks or until you are told to stop.   TED hose      Comments:   Use stockings (TED hose) for 3 weeks on operative leg(s).  You may remove them at night for sleeping.   Change dressing      Comments:   Change dressing on Saturday and Tuesday, then change the dressing daily with sterile 4 x 4 inch gauze dressing and apply TED hose.  You may clean the incision with alcohol prior to redressing.   Do not put a pillow under the knee. Place it under the heel.         DISCHARGE MEDICATIONS:     Medication List     As of 08/12/2012 11:45 AM    STOP taking these medications         fish oil-omega-3 fatty acids 1000 MG capsule      naproxen sodium 220 MG tablet   Commonly known as: ANAPROX      TAKE these medications         atorvastatin 10 MG tablet   Commonly known as: LIPITOR   Take 1 tablet (10 mg total) by mouth daily.      cetirizine-pseudoephedrine 5-120 MG per tablet   Commonly known as: ZYRTEC-D   Take 1 tablet by mouth daily as needed. For allergies      methocarbamol 500 MG tablet   Commonly known as: ROBAXIN   Take 1 tablet (500 mg total) by mouth every 6 (six) hours as needed.      ondansetron 4 MG tablet   Commonly known as: ZOFRAN   Take 1 tablet (4 mg total) by mouth every 6 (six) hours as needed for nausea.      oxyCODONE 5 MG  immediate release tablet   Commonly known as: Oxy IR/ROXICODONE   Take 1-2 tablets (5-10 mg total) by mouth every 4 (four) hours as needed.      rivaroxaban 10 MG Tabs tablet   Commonly known as: XARELTO   Take 1 tablet (10 mg total) by mouth at bedtime.      venlafaxine XR 75 MG 24 hr capsule   Commonly known as: EFFEXOR-XR   Take 1 capsule (75 mg total) by mouth daily.        FOLLOW UP VISIT:       Follow-up Information    Follow up with [facility], Claude Manges, MD. Schedule an appointment as soon as possible for a visit on 08/23/2012.   Contact information:   [address] [place] [address] [phone]          DISPOSITION:  Home   CONDITION:  Stable  Jodi Duffy 08/12/2012, 11:45 AM

## 2012-08-12 NOTE — Progress Notes (Signed)
Physical Therapy Treatment Patient Details Name: Jodi Duffy MRN: [id number] DOB: 1950-12-22 Today's Date: 08/12/2012 Time: 0750-0900 PT Time Calculation (min): 70 min  PT Assessment / Plan / Recommendation Comments on Treatment Session  Pt has met all acute PT goals and is appropriate to d/c home when cleared by MD.  Educated pt and spouse on HEP and rehab process including length of HHPT, CPM use and OPPT progression     Follow Up Recommendations  Home health PT    Barriers to Discharge        Equipment Recommendations  None recommended by PT;None recommended by OT    Recommendations for Other Services    Frequency 7X/week   Plan All goals met and education completed, patient dischaged from PT services    Precautions / Restrictions Precautions Precautions: Knee Precaution Booklet Issued: No Precaution Comments: pt verbalizes need for nothing under the knee Restrictions Weight Bearing Restrictions: Yes RLE Weight Bearing: Partial weight bearing RLE Partial Weight Bearing Percentage or Pounds: 50%   Pertinent Vitals/Pain Pt reports 3/10 pain in knee.  Applied ICE packs and notified RN.      Mobility  Bed Mobility Bed Mobility: Supine to Sit;Sitting - Scoot to Edge of Bed Supine to Sit: 7: Independent;HOB flat Sitting - Scoot to Edge of Bed: 7: Independent Transfers Transfers: Sit to Stand;Stand to Sit Sit to Stand: 6: Modified independent (Device/Increase time);From toilet;From chair/3-in-1;With armrests Stand to Sit: 6: Modified independent (Device/Increase time);To toilet;To bed;With upper extremity assist Details for Transfer Assistance: no assist needed for transfers Ambulation/Gait Ambulation/Gait Assistance: 6: Modified independent (Device/Increase time) Ambulation Distance (Feet): 150 Feet Assistive device: Rolling walker Ambulation/Gait Assistance Details: Cued pt to increase gait speed and fluidity of movement with walker.  Continued need for VCs for  upright cervical posture.  Explained reason for upright posture (avoid dizziness and neck pain) and pt compliance improved.   Pt demonstrating PWB without prompting.  Gait Pattern: Step-to pattern Gait velocity: improved after instruction.  Stairs: No Wheelchair Mobility Wheelchair Mobility: No    Exercises Total Joint Exercises Ankle Circles/Pumps: AROM;Strengthening;Both;10 reps;Seated Quad Sets: AROM;Strengthening;10 reps;Seated;Both Short Arc Quad: AROM;Right;10 reps;Seated Heel Slides: AROM;Right;10 reps;Seated Hip ABduction/ADduction: AROM;Right;10 reps;Seated Straight Leg Raises: AROM;Right;10 reps;Seated Long Arc Quad: AROM;10 reps;Right;Seated Knee Flexion: AAROM;Right;10 reps;Seated Goniometric ROM: 0-82 degrees AROM in supine.    PT Diagnosis:    PT Problem List:   PT Treatment Interventions:     PT Goals Acute Rehab PT Goals PT Goal Formulation: With patient Time For Goal Achievement: 08/13/12 Potential to Achieve Goals: Good Pt will go Supine/Side to Sit: with modified independence;with HOB 0 degrees PT Goal: Supine/Side to Sit - Progress: Met Pt will go Sit to Supine/Side: with modified independence PT Goal: Sit to Supine/Side - Progress: Met Pt will go Sit to Stand: with modified independence PT Goal: Sit to Stand - Progress: Met Pt will Ambulate: 51 - 150 feet;with supervision;with least restrictive assistive device PT Goal: Ambulate - Progress: Met Pt will Go Up / Down Stairs: 1-2 stairs;with supervision;with least restrictive assistive device PT Goal: Up/Down Stairs - Progress: Met Pt will Perform Home Exercise Program: with supervision, verbal cues required/provided PT Goal: Perform Home Exercise Program - Progress: Met  Visit Information  Last PT Received On: 08/12/12 Assistance Needed: +1    Subjective Data      Cognition  Overall Cognitive Status: Appears within functional limits for tasks assessed/performed Arousal/Alertness:  Awake/alert Orientation Level: Oriented X4 / Intact Behavior During Session: [facility] for tasks  performed    Balance  Balance Balance Assessed: No  End of Session PT - End of Session Equipment Utilized During Treatment: Gait belt Activity Tolerance: Patient tolerated treatment well Patient left: in chair;with call bell/phone within reach;with family/visitor present Nurse Communication: Mobility status   GP     Jodi Duffy 08/12/2012, 10:54 AM

## 2012-08-12 NOTE — Progress Notes (Signed)
Patient ID: Jodi Duffy, female   DOB: 23-Aug-1951, 61 y.o.   MRN: [id number] PATIENT ID: Jodi Duffy        MRN:  [id number]          DOB/AGE: [birth year] / 61 y.o.    Jodi Campbell, MD   Jodi Code, PA-C [address] [place], [place]  [address]                             [phone]   PROGRESS NOTE  Subjective:  negative for Chest Pain  negative for Shortness of Breath  positive for Nausea/Vomiting last night but none this morning and slept well  negative for Calf Pain    Tolerating Diet: yes         Patient reports pain as mild.     States had a good night once the nausea medications were effective.  Wants to go home.  Objective: Vital signs in last 24 hours:   Patient Vitals for the past 24 hrs:  BP Temp Pulse Resp SpO2 Height Weight  08/12/12 0300 129/59 mmHg 99.1 F (37.3 C) 75  18  94 % - -  08/11/12 2100 133/77 mmHg 99.8 F (37.7 C) 94  18  93 % - -  08/11/12 1329 119/57 mmHg 99.5 F (37.5 C) 101  18  95 % - -  08/11/12 1300 - - - - - 5\' 3"  (1.6 m) 65.318 kg (144 lb)      Intake/Output from previous day:   09/25 0701 - 09/26 0700 In: 2040 [P.O.:2040] Out: 123 [Urine:3; Drains:120]   Intake/Output this shift:   09/26 0701 - 09/26 1900 In: 360 [P.O.:360] Out: -    Intake/Output      09/25 0701 - 09/26 0700 09/26 0701 - 09/27 0700   P.O. 2040 360   I.V. (mL/kg)     Total Intake(mL/kg) 2040 (31.2) 360 (5.5)   Urine (mL/kg/hr) 3 (0)    Drains 120    Blood     Total Output 123    Net +1917 +360        Urine Occurrence 2 x       LABORATORY DATA:  Basename 08/12/12 0535 08/11/12 0617  WBC 7.5 6.4  HGB 8.9* 9.4*  HCT 26.9* 28.2*  PLT 188 196    Basename 08/12/12 0535 08/11/12 0617  NA 140 139  K 4.1 3.8  CL 105 103  CO2 26 28  BUN 7 9  CREATININE 0.50 0.59  GLUCOSE 134* 130*  CALCIUM 8.8 8.6   Lab Results  Component Value Date   INR 1.04 08/05/2012    Recent Radiographic Studies :   Chest 2 View  08/05/2012  *RADIOLOGY  REPORT*  Clinical Data: Preop for right knee arthroplasty  CHEST - 2 VIEW  Comparison: None  Findings: Cardiomediastinal silhouette is unremarkable.  Mild thoracic dextroscoliosis.  No acute infiltrate or pulmonary edema. Old fracture of the left ninth rib. Mild degenerative changes thoracic spine.  IMPRESSION: No active disease.  Mild dextroscoliosis and mild degenerative changes thoracic spine.   Original Report Authenticated By: Natasha Mead, M.D.      Examination:  General appearance: alert, cooperative and mild distress Resp: clear to auscultation bilaterally Cardio: regular rate and rhythm GI: normal findings: bowel sounds normal  Wound Exam: clean, dry, intact   Drainage:  None: wound tissue dry  Motor Exam: EHL, FHL, Anterior Tibial and Posterior Tibial Intact  Sensory Exam: Superficial Peroneal, Deep Peroneal and Tibial normal  Vascular Exam: Right posterior tibial artery has 1+ (weak) pulse  Assessment:    2 Days Post-Op  Procedure(s) (LRB): TOTAL KNEE ARTHROPLASTY (Right)  ADDITIONAL DIAGNOSIS:  Active Problems:  * No active hospital problems. *   Acute Blood Loss Anemian asymptomatic   Plan: Physical Therapy as ordered Partial Weight Bearing @ 50% (PWB)  DVT Prophylaxis:  Xarelto, Foot Pumps and TED hose  DISCHARGE PLAN: Home  DISCHARGE NEEDS: HHPT, CPM, Walker and 3-in-1 comode seat         PETRARCA,BRIAN 08/12/2012 11:33 AM

## 2012-08-18 ENCOUNTER — Ambulatory Visit: Payer: BC Managed Care – PPO | Admitting: Cardiology

## 2012-11-15 ENCOUNTER — Ambulatory Visit (INDEPENDENT_AMBULATORY_CARE_PROVIDER_SITE_OTHER): Payer: BC Managed Care – PPO | Admitting: Medical

## 2012-11-15 ENCOUNTER — Encounter: Payer: Self-pay | Admitting: Medical

## 2012-11-15 VITALS — BP 120/82 | HR 86 | Temp 98.1°F | Wt 139.0 lb

## 2012-11-15 DIAGNOSIS — J40 Bronchitis, not specified as acute or chronic: Secondary | ICD-10-CM

## 2012-11-15 DIAGNOSIS — Z23 Encounter for immunization: Secondary | ICD-10-CM

## 2012-11-15 MED ORDER — AZITHROMYCIN 250 MG PO TABS: ORAL_TABLET | ORAL | Status: DC

## 2012-11-15 MED ORDER — HYDROCOD POLST-CHLORPHEN POLST 10-8 MG/5ML PO LQCR: 5.0000 mL | Freq: Two times a day (BID) | ORAL | Status: DC

## 2012-11-15 NOTE — Progress Notes (Signed)
Subjective:  Jodi Duffy is a 61 y.o. female who presents for cough and congestion.  She notes that she gets this about once yearly in the winter, prior treated for bronchitis.  She notes having some cold symptoms for a few weeks, but since last Thursday been having worse cough, deep cough, colored sputum, deep wet cough, ears hurt.  She was around her grandson this past weekend who had RSV.  She denies fever, sore throat, NVD, SOB, wheezing.  Using nothing for symptoms.  Nonsmoker, no hx/o lung disease.   No other aggravating or relieving factors.  No other c/o.  The following portions of the patient's history were reviewed and updated as appropriate: allergies, current medications, past family history, past medical history, past social history, past surgical history and problem list.  Past Medical History  Diagnosis Date  . Allergy     RHINITIS  . Depression   . TMJ syndrome   . Osteopenia   . Arthritis    ROS as in HPI  Objective:   Filed Vitals:   11/15/12 1509  BP: 120/82  Pulse: 86  Temp: 98.1 F (36.7 C)    General appearance: Alert, WD/WN, no distress                             Skin: warm, no rash, no diaphoresis                           Head: no sinus tenderness                            Eyes: conjunctiva normal, corneas clear, PERRLA                            Ears: pearly TMs, external ear canals normal                          Nose: septum midline, turbinates swollen, with erythema and clear discharge             Mouth/throat: MMM, tongue normal, mild pharyngeal erythema                           Neck: supple, no adenopathy, no thyromegaly, nontender                          Heart: RRR, normal S1, S2, no murmurs                         Lungs: +bronchial breath sounds, +scattered rhonchi, no wheezes, no rales                Extremities: no edema, nontender     Assessment and Plan:   Encounter Diagnoses  Name Primary?  . Bronchitis Yes  . Need for  pneumococcal vaccination    Prescription given today for Azithromycin antibiotic, Tussionex syrup as below.  Discussed diagnosis and treatment of bronchitis.  Suggested symptomatic OTC remedies for congestion.  Tylenol or Ibuprofen OTC for fever and malaise.  Call/return in 2-3 days if symptoms are worse or not improving.    Pneumococcal vaccine, VIS and vaccine counseling given.  return if worse or not improving.

## 2013-02-11 ENCOUNTER — Ambulatory Visit (INDEPENDENT_AMBULATORY_CARE_PROVIDER_SITE_OTHER): Payer: BC Managed Care – PPO | Admitting: Family Medicine

## 2013-02-11 ENCOUNTER — Encounter: Payer: Self-pay | Admitting: Family Medicine

## 2013-02-11 VITALS — BP 150/92 | HR 84 | Temp 98.3°F | Ht 63.0 in | Wt 140.0 lb

## 2013-02-11 DIAGNOSIS — J069 Acute upper respiratory infection, unspecified: Secondary | ICD-10-CM

## 2013-02-11 DIAGNOSIS — E785 Hyperlipidemia, unspecified: Secondary | ICD-10-CM

## 2013-02-11 MED ORDER — AZITHROMYCIN 500 MG PO TABS: 500.0000 mg | ORAL_TABLET | Freq: Every day | ORAL | Status: DC

## 2013-02-11 MED ORDER — HYDROCOD POLST-CHLORPHEN POLST 10-8 MG/5ML PO LQCR: 5.0000 mL | Freq: Two times a day (BID) | ORAL | Status: DC | PRN

## 2013-02-11 MED ORDER — ATORVASTATIN CALCIUM 10 MG PO TABS: 10.0000 mg | ORAL_TABLET | Freq: Every day | ORAL | Status: DC

## 2013-02-11 NOTE — Progress Notes (Signed)
  Subjective:    Patient ID: Jodi Duffy, female    DOB: [birth year], 62 y.o.   MRN: [id number]  HPI Has a 4 day history started with cough and watery eyes followed by chest congestion, earache, malaise, fatigue. She now has a dry hacking cough. She does have seasonal allergies with sneezing, itchy watery eyes.her main complaint today is coughing. She has tried Delsym and NyQuil without relief. She would also like her Lipitor called in for a 90 day prescription.   Review of Systems     Objective:   Physical Exam alert and in no distress but looking fatigued. Tympanic membranes and canals are normal. Throat is clear. Tonsils are normal. Neck is supple without adenopathy or thyromegaly. Cardiac exam shows a regular sinus rhythm without murmurs or gallops. Lungs are clear to auscultation.        Assessment & Plan:  Hyperlipidemia LDL goal < 100 - Plan: atorvastatin (LIPITOR) 10 MG tablet  Acute URI - Plan: chlorpheniramine-HYDROcodone (TUSSIONEX PENNKINETIC ER) 10-8 MG/5ML LQCR, azithromycin (ZITHROMAX) 500 MG tablet recommend she use a cough suppressant as needed but hold off on the antibiotic. If she is not improving by the end of the weekend, I have recommended that she start the azithromycin.

## 2013-02-11 NOTE — Patient Instructions (Signed)
Use the Tussionex as needed for coughing. If you don't get better by towards the end of the week and then start the azithromycin.

## 2013-08-08 ENCOUNTER — Ambulatory Visit (INDEPENDENT_AMBULATORY_CARE_PROVIDER_SITE_OTHER): Payer: BC Managed Care – PPO | Admitting: Family Medicine

## 2013-08-08 ENCOUNTER — Encounter: Payer: Self-pay | Admitting: Family Medicine

## 2013-08-08 VITALS — BP 112/70 | HR 60 | Ht 63.0 in | Wt 140.0 lb

## 2013-08-08 DIAGNOSIS — Z79899 Other long term (current) drug therapy: Secondary | ICD-10-CM

## 2013-08-08 DIAGNOSIS — Z8249 Family history of ischemic heart disease and other diseases of the circulatory system: Secondary | ICD-10-CM

## 2013-08-08 DIAGNOSIS — Z0000 Encounter for general adult medical examination without abnormal findings: Secondary | ICD-10-CM

## 2013-08-08 DIAGNOSIS — Z23 Encounter for immunization: Secondary | ICD-10-CM

## 2013-08-08 DIAGNOSIS — F341 Dysthymic disorder: Secondary | ICD-10-CM

## 2013-08-08 LAB — POCT URINALYSIS DIPSTICK
Bilirubin, UA: NEGATIVE
Ketones, UA: NEGATIVE
Leukocytes, UA: NEGATIVE
Spec Grav, UA: 1.01
pH, UA: 7

## 2013-08-08 LAB — COMPREHENSIVE METABOLIC PANEL
ALT: 14 U/L (ref 0–35)
Alkaline Phosphatase: 64 U/L (ref 39–117)
Creat: 0.63 mg/dL (ref 0.50–1.10)
Sodium: 141 mEq/L (ref 135–145)
Total Bilirubin: 0.3 mg/dL (ref 0.3–1.2)
Total Protein: 6.3 g/dL (ref 6.0–8.3)

## 2013-08-08 LAB — CBC WITH DIFFERENTIAL/PLATELET
HCT: 38 % (ref 36.0–46.0)
Hemoglobin: 12.7 g/dL (ref 12.0–15.0)
Lymphocytes Relative: 34 % (ref 12–46)
Lymphs Abs: 1.5 10*3/uL (ref 0.7–4.0)
Monocytes Absolute: 0.3 10*3/uL (ref 0.1–1.0)
Monocytes Relative: 8 % (ref 3–12)
Neutro Abs: 2.3 10*3/uL (ref 1.7–7.7)
WBC: 4.2 10*3/uL (ref 4.0–10.5)

## 2013-08-08 LAB — LIPID PANEL
Cholesterol: 182 mg/dL (ref 0–200)
LDL Cholesterol: 88 mg/dL (ref 0–99)
Total CHOL/HDL Ratio: 2.9 Ratio
VLDL: 32 mg/dL (ref 0–40)

## 2013-08-08 MED ORDER — VENLAFAXINE HCL 50 MG PO TABS: 50.0000 mg | ORAL_TABLET | Freq: Two times a day (BID) | ORAL | Status: DC

## 2013-08-08 NOTE — Patient Instructions (Signed)
Take the 50 mg for 2 weeks and if you doing fine on it see if you can break it in half and take the 25 for 2 weeks. If you have any problems let me know

## 2013-08-08 NOTE — Progress Notes (Signed)
  Subjective:    Patient ID: Jodi Duffy, female    DOB: 11/13/1951, 62 y.o.   MRN: [id number]  HPI She is here for complete examination. She does routine gynecologic care including Pap, pelvic and mammogram. She does have a family history of breast cancer with her mother and aunt as well as other family members with various cancers. She continues on Lipitor. She did have a knee replacement and is doing quite well with this. Her family and social history were reviewed. Work and home life are going quite well. She otherwise has no concerns or questions.   Review of Systems Negative except as above    Objective:   Physical Exam BP 112/70  Pulse 60  Ht 5\' 3"  (1.6 m)  Wt 140 lb (63.504 kg)  BMI 24.81 kg/m2  General Appearance:    Alert, cooperative, no distress, appears stated age  Head:    Normocephalic, without obvious abnormality, atraumatic  Eyes:    PERRL, conjunctiva/corneas clear, EOM's intact, fundi    benign  Ears:    Normal TM's and external ear canals  Nose:   Nares normal, mucosa normal, no drainage or sinus   tenderness  Throat:   Lips, mucosa, and tongue normal; teeth and gums normal  Neck:   Supple, no lymphadenopathy;  thyroid:  no   enlargement/tenderness/nodules; no carotid   bruit or JVD  Back:    Spine nontender, no curvature, ROM normal, no CVA     tenderness  Lungs:     Clear to auscultation bilaterally without wheezes, rales or     ronchi; respirations unlabored  Chest Wall:    No tenderness or deformity   Heart:    Regular rate and rhythm, S1 and S2 normal, no murmur, rub   or gallop  Breast Exam:    Deferred to GYN  Abdomen:     Soft, non-tender, nondistended, normoactive bowel sounds,    no masses, no hepatosplenomegaly  Genitalia:    Deferred to GYN     Extremities:   No clubbing, cyanosis or edema  Pulses:   2+ and symmetric all extremities  Skin:   Skin color, texture, turgor normal, no rashes or lesions  Lymph nodes:   Cervical, supraclavicular,  and axillary nodes normal  Neurologic:   CNII-XII intact, normal strength, sensation and gait; reflexes 2+ and symmetric throughout          Psych:   Normal mood, affect, hygiene and grooming.          Assessment & Plan:  Routine general medical examination at a health care facility - Plan: POCT Urinalysis Dipstick, Flu Vaccine QUAD 36+ mos IM, CBC with Differential, Comprehensive metabolic panel, Lipid panel  Need for prophylactic vaccination and inoculation against influenza - Plan: Flu Vaccine QUAD 36+ mos IM  Family history of heart disease in female family member before age 59 - Plan: Lipid panel  Encounter for long-term (current) use of other medications - Plan: Flu Vaccine QUAD 36+ mos IM, CBC with Differential, Comprehensive metabolic panel, Lipid panel  Dysthymia - Plan: venlafaxine (EFFEXOR) 50 MG tablet  discussed the possibility of getting a BRCA test done however she is not interested at this time. Flu shot given with risks and benefits discussed

## 2013-08-09 NOTE — Progress Notes (Signed)
Quick Note:  LEFT WORD FOR WORD MESSAGE Labs all look good ______

## 2013-08-13 ENCOUNTER — Other Ambulatory Visit: Payer: Self-pay | Admitting: Family Medicine

## 2013-08-22 ENCOUNTER — Other Ambulatory Visit: Payer: Self-pay | Admitting: Family Medicine

## 2013-11-14 ENCOUNTER — Telehealth: Payer: Self-pay

## 2013-11-14 DIAGNOSIS — F341 Dysthymic disorder: Secondary | ICD-10-CM

## 2013-11-14 MED ORDER — VENLAFAXINE HCL 50 MG PO TABS: 50.0000 mg | ORAL_TABLET | ORAL | Status: DC

## 2013-11-14 NOTE — Telephone Encounter (Signed)
PT CALLED AND SAID SHE HAD QUESTION ON EFFEXOR 50 MG THE DIRECTIONS SAY TAKE 2 A DAY BUT ONLY BEING FILLED 1 A DAY AND SHE WANTS REFILL FOR 90 DAYS PLEASE ADVISE

## 2013-11-14 NOTE — Telephone Encounter (Signed)
On her last visit we did reduce her Effexor to 50 mg however it was twice a day. She did not take it twice per day. She has done well on the plane 50 mg. I will give her a 90 day supply. Recommend that she stop the medication when she runs out of the 90 days and see how she does. She is comfortable with that

## 2014-02-06 ENCOUNTER — Encounter: Payer: Self-pay | Admitting: Podiatry

## 2014-02-06 ENCOUNTER — Ambulatory Visit (INDEPENDENT_AMBULATORY_CARE_PROVIDER_SITE_OTHER): Payer: 59 | Admitting: Podiatry

## 2014-02-06 ENCOUNTER — Ambulatory Visit (INDEPENDENT_AMBULATORY_CARE_PROVIDER_SITE_OTHER): Payer: 59

## 2014-02-06 VITALS — BP 153/89 | HR 84 | Resp 12

## 2014-02-06 DIAGNOSIS — R52 Pain, unspecified: Secondary | ICD-10-CM

## 2014-02-06 DIAGNOSIS — M2040 Other hammer toe(s) (acquired), unspecified foot: Secondary | ICD-10-CM

## 2014-02-06 DIAGNOSIS — M779 Enthesopathy, unspecified: Secondary | ICD-10-CM

## 2014-02-06 MED ORDER — TRIAMCINOLONE ACETONIDE 10 MG/ML IJ SUSP
10.0000 mg | Freq: Once | INTRAMUSCULAR | Status: AC
  Administered 2014-02-06: 10 mg

## 2014-02-06 NOTE — Progress Notes (Signed)
   Subjective:    Patient ID: Jodi Duffy, female    DOB: September 07, 1951, 63 y.o.   MRN: [id number]  HPI PT STATED LT FOOT 2ND AND 3RD TOE VERY PAINFUL. THE TOES ARE GETTING WORSE. THE TOE GET AGGRAVATED BY WEARING SHOES AND PRESSURE. TRIED TO PUT BANDAGE ON THE TOP OF THE TOES AND IT HELP.    Review of Systems     Objective:   Physical Exam        Assessment & Plan:

## 2014-02-06 NOTE — Progress Notes (Signed)
Subjective:     Patient ID: Jodi Duffy, female   DOB: 1951-03-25, 63 y.o.   MRN: [id number]  Toe Pain    patient states I have trouble with these 2 toes on my left foot and have trouble wearing shoe gear comfortably. States it's been going on for about a year   Review of Systems  All other systems reviewed and are negative.       Objective:   Physical Exam  Nursing note and vitals reviewed. Constitutional: She is oriented to person, place, and time.  Cardiovascular: Intact distal pulses.   Musculoskeletal: Normal range of motion.  Neurological: She is oriented to person, place, and time.  Skin: Skin is warm.   neurovascular status intact with Fill time to digits noted and structural digital deformity second and third left foot over right foot. Range of motion within normal limits and muscle strength adequate. I noted there to be exquisite discomfort head of proximal phalanx digit 2 left with inflammation and fluid buildup around the interphalangeal joint    Assessment:     Structural hammertoe deformity with capsulitis interphalangeal joint digit 2 left    Plan:     H&P performed and conditions discussed along with x-rays. Interphalangeal joint injection 2 mg dexamethasone 1 mg Kenalog 2 mg Xylocaine and debridement and padding accomplished. Reappoint for ultimate digital fusion digits 23 left with probable internal K wire fixation

## 2014-03-12 ENCOUNTER — Other Ambulatory Visit: Payer: Self-pay | Admitting: Family Medicine

## 2014-04-24 ENCOUNTER — Other Ambulatory Visit: Payer: Self-pay | Admitting: Family Medicine

## 2014-06-06 LAB — HM PAP SMEAR: HM Pap smear: NEGATIVE

## 2014-06-06 LAB — HM MAMMOGRAPHY: HM Mammogram: NEGATIVE

## 2014-06-21 ENCOUNTER — Other Ambulatory Visit: Payer: Self-pay | Admitting: Family Medicine

## 2014-08-09 ENCOUNTER — Telehealth: Payer: Self-pay | Admitting: Family Medicine

## 2014-08-09 NOTE — Telephone Encounter (Signed)
I HAVE RUN THE [employer] ONLINE PT DOESN'T REQUIRE A REFERRAL I HAVE CALLED AND TALKED WITH ELLEN AND SHE SAID SHE WOULD CALL HER TO SET UP AN APPOINTMENT

## 2014-08-09 NOTE — Telephone Encounter (Signed)
Take care of this 

## 2014-08-09 NOTE — Telephone Encounter (Signed)
Pt called and stated that due to her insurance she needs a referral to her hearing doc. She has been to see him several times, she recently noticed a decline in hearing and tried to make an appt.  She was told a referral must be made because of her insurance which is [employer]. She needs it sent to Dr. Vicie Mutters with the ear center. It needs to say referral for audiology evaluation and audiogram. Please fax to Attn: Dorian Pod at [phone].

## 2014-09-06 ENCOUNTER — Ambulatory Visit (INDEPENDENT_AMBULATORY_CARE_PROVIDER_SITE_OTHER): Payer: 59 | Admitting: Family Medicine

## 2014-09-06 ENCOUNTER — Encounter: Payer: Self-pay | Admitting: Family Medicine

## 2014-09-06 VITALS — BP 114/70 | HR 92 | Ht 63.0 in | Wt 142.0 lb

## 2014-09-06 DIAGNOSIS — Z79899 Other long term (current) drug therapy: Secondary | ICD-10-CM

## 2014-09-06 DIAGNOSIS — E785 Hyperlipidemia, unspecified: Secondary | ICD-10-CM

## 2014-09-06 DIAGNOSIS — F341 Dysthymic disorder: Secondary | ICD-10-CM

## 2014-09-06 DIAGNOSIS — Z803 Family history of malignant neoplasm of breast: Secondary | ICD-10-CM

## 2014-09-06 DIAGNOSIS — H409 Unspecified glaucoma: Secondary | ICD-10-CM

## 2014-09-06 DIAGNOSIS — Z23 Encounter for immunization: Secondary | ICD-10-CM

## 2014-09-06 LAB — CBC WITH DIFFERENTIAL/PLATELET
BASOS ABS: 0 10*3/uL (ref 0.0–0.1)
Basophils Relative: 1 % (ref 0–1)
EOS PCT: 2 % (ref 0–5)
Eosinophils Absolute: 0.1 10*3/uL (ref 0.0–0.7)
HCT: 40.2 % (ref 36.0–46.0)
HEMOGLOBIN: 13.5 g/dL (ref 12.0–15.0)
LYMPHS ABS: 1.5 10*3/uL (ref 0.7–4.0)
Lymphocytes Relative: 40 % (ref 12–46)
MCH: 30.5 pg (ref 26.0–34.0)
MCHC: 33.6 g/dL (ref 30.0–36.0)
MCV: 90.7 fL (ref 78.0–100.0)
MONO ABS: 0.3 10*3/uL (ref 0.1–1.0)
MONOS PCT: 7 % (ref 3–12)
NEUTROS ABS: 1.9 10*3/uL (ref 1.7–7.7)
Neutrophils Relative %: 50 % (ref 43–77)
Platelets: 294 10*3/uL (ref 150–400)
RBC: 4.43 MIL/uL (ref 3.87–5.11)
RDW: 14.6 % (ref 11.5–15.5)
WBC: 3.8 10*3/uL — ABNORMAL LOW (ref 4.0–10.5)

## 2014-09-06 MED ORDER — VENLAFAXINE HCL ER 37.5 MG PO CP24: 37.5000 mg | ORAL_CAPSULE | Freq: Every day | ORAL | Status: DC

## 2014-09-06 MED ORDER — ATORVASTATIN CALCIUM 10 MG PO TABS: ORAL_TABLET | ORAL | Status: DC

## 2014-09-06 NOTE — Progress Notes (Signed)
   Subjective:    Patient ID: Jodi Duffy, female    DOB: 1951/07/23, 63 y.o.   MRN: [id number]  HPI She is here for medication check. She is now being treated for glaucoma. She does get regular mammograms. There is a family history of breast cancer with her mother and an aunt. She continues on Effexor and is interested in cutting back on this. She notes that this does help with irritability and mood. She is doing well on Lipitor and having no difficulty with that. She has no other concerns or complaints. Her immunizations and medical record was reviewed. Her work and home life are going quite well.   Review of Systems  All other systems reviewed and are negative.      Objective:   Physical Exam alert and in no distress. Tympanic membranes and canals are normal. Throat is clear. Tonsils are normal. Neck is supple without adenopathy or thyromegaly. Cardiac exam shows a regular sinus rhythm without murmurs or gallops. Lungs are clear to auscultation.        Assessment & Plan:  Need for prophylactic vaccination and inoculation against influenza - Plan: Flu vaccine HIGH DOSE PF (Fluzone Tri High dose)  Glaucoma  Dysthymia - Plan: venlafaxine XR (EFFEXOR-XR) 37.5 MG 24 hr capsule  Family history of breast cancer in mother  Hyperlipidemia LDL goal <100 - Plan: atorvastatin (LIPITOR) 10 MG tablet, CBC with Differential, Comprehensive metabolic panel, Lipid panel  Encounter for long-term (current) use of medications - Plan: CBC with Differential, Comprehensive metabolic panel, Lipid panel  I will cut her down to 37-1/2 of Effexor. If she does well on this, we will consider taking her off medication in the spring. Recommend continued yearly mammograms and she is in agreement with this.

## 2014-09-07 LAB — COMPREHENSIVE METABOLIC PANEL
ALT: 15 U/L (ref 0–35)
AST: 15 U/L (ref 0–37)
Albumin: 4 g/dL (ref 3.5–5.2)
Alkaline Phosphatase: 65 U/L (ref 39–117)
BUN: 16 mg/dL (ref 6–23)
CALCIUM: 9 mg/dL (ref 8.4–10.5)
CHLORIDE: 108 meq/L (ref 96–112)
CO2: 26 meq/L (ref 19–32)
CREATININE: 0.57 mg/dL (ref 0.50–1.10)
Glucose, Bld: 99 mg/dL (ref 70–99)
POTASSIUM: 4.3 meq/L (ref 3.5–5.3)
Sodium: 142 mEq/L (ref 135–145)
Total Bilirubin: 0.4 mg/dL (ref 0.2–1.2)
Total Protein: 6.5 g/dL (ref 6.0–8.3)

## 2014-09-07 LAB — LIPID PANEL
CHOLESTEROL: 167 mg/dL (ref 0–200)
HDL: 58 mg/dL (ref >39)
LDL Cholesterol: 82 mg/dL (ref 0–99)
Total CHOL/HDL Ratio: 2.9 Ratio
Triglycerides: 135 mg/dL (ref <150)
VLDL: 27 mg/dL (ref 0–40)

## 2014-09-27 ENCOUNTER — Ambulatory Visit (INDEPENDENT_AMBULATORY_CARE_PROVIDER_SITE_OTHER): Payer: 59 | Admitting: Podiatry

## 2014-09-27 ENCOUNTER — Encounter: Payer: Self-pay | Admitting: Podiatry

## 2014-09-27 VITALS — BP 125/87 | HR 83 | Resp 16

## 2014-09-27 DIAGNOSIS — M779 Enthesopathy, unspecified: Secondary | ICD-10-CM

## 2014-09-27 DIAGNOSIS — M2042 Other hammer toe(s) (acquired), left foot: Secondary | ICD-10-CM

## 2014-09-27 MED ORDER — TRIAMCINOLONE ACETONIDE 10 MG/ML IJ SUSP
10.0000 mg | Freq: Once | INTRAMUSCULAR | Status: AC
  Administered 2014-09-27: 10 mg

## 2014-09-27 NOTE — Progress Notes (Signed)
Subjective:     Patient ID: Jodi Duffy, female   DOB: 05-02-1951, 63 y.o.   MRN: [id number]  HPIpatient presents stating I need to get these toes fixed as I am still having a lot of pain on top of the toes especially the second one and I know I'm getting need to have them straightened.   Review of Systems     Objective:   Physical Exam Neurovascular status intact with no other health history changes noted and elevated second toe left that's painful when pressed with inflammation at the head of the proximal phalanx and mild changes in the third toe. There is fluid buildup noted second toe and I noted all digits to be well perfused    Assessment:     Hammertoe deformity second and third toe left foot with inflammatory capsulitis of the top of the toe    Plan:     H&P and condition discussed explained with patient. I did a careful injection of the interphalangeal joint left second toe and we discussed surgery and she wants to get this done in the next few weeks. I discussed digital fusion of digits 2 and 3 she wants to have this done will look at her schedule and scheduled procedure. I did allow her to read a consent form today explaining the surgery and all possible complications that can occur with the procedure such as this and the fact there is no long-term guarantees as far success of procedure and the told recovery. Can take around 6 months. Patient scheduled for outpatient surgery in the next several weeks and will call and was given all preoperative instructions

## 2014-09-28 ENCOUNTER — Telehealth: Payer: Self-pay | Admitting: *Deleted

## 2014-09-28 NOTE — Telephone Encounter (Signed)
I was in to see Dr. Ila Mcgill yesterday.  I'd like to schedule surgery.  If you would call me.  Thanks  I returned her call.  "Here's the deal I want to schedule surgery.  He said I can do it any day I want.  So, I'd like to do it on Wednesday the 25th.  Is that time available?"  I told her it would be a lunch time case.  "I'm okay with that, I'll survive not having anything."  Go ahead and go on-line and do your forms for the surgical center.  They will call you a day or two before surgery date and let you know exactly what time to be there.  You cannot eat or drink anything 6-8 hours prior to your surgery.  If you have questions about whether to take medication ask then when they call.   "Thank you so much."  I faxed forms to Ca[facility] to get the surgery scheduled.

## 2014-10-11 ENCOUNTER — Encounter: Payer: Self-pay | Admitting: Podiatry

## 2014-10-11 DIAGNOSIS — M2042 Other hammer toe(s) (acquired), left foot: Secondary | ICD-10-CM

## 2014-10-18 ENCOUNTER — Ambulatory Visit (INDEPENDENT_AMBULATORY_CARE_PROVIDER_SITE_OTHER): Payer: 59

## 2014-10-18 ENCOUNTER — Encounter: Payer: Self-pay | Admitting: Podiatry

## 2014-10-18 ENCOUNTER — Ambulatory Visit (INDEPENDENT_AMBULATORY_CARE_PROVIDER_SITE_OTHER): Payer: 59 | Admitting: Podiatry

## 2014-10-18 VITALS — BP 158/91 | HR 90 | Resp 16

## 2014-10-18 DIAGNOSIS — M2042 Other hammer toe(s) (acquired), left foot: Secondary | ICD-10-CM

## 2014-10-18 NOTE — Progress Notes (Signed)
Subjective:     Patient ID: Jodi Duffy, female   DOB: 1951-09-02, 63 y.o.   MRN: [id number]  HPI patient presents stating I'm doing very well   Review of Systems     Objective:   Physical Exam Neurovascular status intact with good alignment of the second and third toes left with stitches intact and wound edges well coapted    Assessment:     Doing well post digital fusion digits 23 of the left foot    Plan:     Reviewed conditions and reapplied sterile dressing and gave instructions on digital splint usage. Reappoint in 2 weeks

## 2014-10-26 ENCOUNTER — Encounter: Payer: Self-pay | Admitting: Family Medicine

## 2014-10-26 ENCOUNTER — Ambulatory Visit (INDEPENDENT_AMBULATORY_CARE_PROVIDER_SITE_OTHER): Payer: 59 | Admitting: Family Medicine

## 2014-10-26 VITALS — BP 150/80 | HR 77

## 2014-10-26 DIAGNOSIS — IMO0001 Reserved for inherently not codable concepts without codable children: Secondary | ICD-10-CM

## 2014-10-26 DIAGNOSIS — R030 Elevated blood-pressure reading, without diagnosis of hypertension: Secondary | ICD-10-CM

## 2014-10-26 NOTE — Progress Notes (Signed)
   Subjective:    Patient ID: Jodi Duffy, female    DOB: 1951-07-29, 63 y.o.   MRN: [id number]  HPI she is here for evaluation of history of elevated blood pressure. Recently she did have foot surgery and was noted to have elevated blood pressure during that timeframe. Since then she has had checked on several occasions and again it has been elevated. Review of her record shows only scattered elevated blood pressure readings. She is also under stress having one of her children and his family members present until he can find a new house. She is also helping her daughter move from [place] to [place].  Review of Systems     Objective:   Physical Exam Alert and in no distress. Review of the blood pressure was accomplished.       Assessment & Plan:  Elevated blood pressure  I explained that she at this point has evidence of transient elevation of her blood pressure however treatment will be deferred until she can be rechecked in one month. Explained that she needs a sustained elevated blood pressure. We also discussed possible symptoms of elevated blood pressure, none of which she has. She was comfortable with this and will recheck in one

## 2014-11-01 ENCOUNTER — Ambulatory Visit (INDEPENDENT_AMBULATORY_CARE_PROVIDER_SITE_OTHER): Payer: 59 | Admitting: Podiatry

## 2014-11-01 DIAGNOSIS — M2042 Other hammer toe(s) (acquired), left foot: Secondary | ICD-10-CM

## 2014-11-01 NOTE — Progress Notes (Signed)
Subjective:     Patient ID: Jodi Duffy, female   DOB: [birth year], 63 y.o.   MRN: [id number]  HPI patient's left second and third toes are doing well with good alignment with wound edges coapted well stitches intact   Review of Systems     Objective:   Physical Exam Neurovascular status intact with well positioned second and third toes and good alignment noted    Assessment:     Doing well post arthroplasty 23 left    Plan:     Remove stitches wound edges remain coapted well and instructed on continued usage of splint

## 2014-11-08 NOTE — Progress Notes (Signed)
Dr Paulla Dolly performed a left 2,3 metatrsal hammertoe repair with external pin placement on 10/11/14

## 2015-05-15 ENCOUNTER — Encounter: Payer: Self-pay | Admitting: Family Medicine

## 2015-05-15 ENCOUNTER — Ambulatory Visit
Admission: RE | Admit: 2015-05-15 | Discharge: 2015-05-15 | Disposition: A | Discharge status: Home / Self Care | Payer: 59 | Source: Ambulatory Visit | Attending: Family Medicine | Admitting: Family Medicine

## 2015-05-15 ENCOUNTER — Ambulatory Visit (INDEPENDENT_AMBULATORY_CARE_PROVIDER_SITE_OTHER): Payer: 59 | Admitting: Family Medicine

## 2015-05-15 VITALS — BP 160/90 | HR 86 | Wt 145.0 lb

## 2015-05-15 DIAGNOSIS — M79602 Pain in left arm: Secondary | ICD-10-CM

## 2015-05-15 NOTE — Patient Instructions (Signed)
2 Aleve twice per day. Attention to any other symptoms that she might be having

## 2015-05-15 NOTE — Progress Notes (Signed)
   Subjective:    Patient ID: Jodi Duffy, female    DOB: 08/17/1951, 64 y.o.   MRN: [id number]  HPI She complains of a 2 day history of intermittent left mid arm pain. It can be very fleeting or last several minutes. She cannot relate this to any particular injuries. Motion of the arm or shoulder makes no difference. She's had no neck pain. No other bones or joints are involved. She did note that last night she had trouble sleeping because of the pain. She also complains of difficulty with increased gas.   Review of Systems     Objective:   Physical Exam Alert and in no distress. Full motion of the shoulder. No palpable tenderness to the shoulder. Normal strength. No tenderness palpation over the humerus or elbow.       Assessment & Plan:  Left arm pain - Plan: DG Humerus Left Did recommend 2 Aleve twice per day. We'll wait for the x-ray result. Discussed being vigilant concerning any other symptoms that might occur with it. Also discussed increased gas in terms of the food intake. She has tried Beano and I recommended trying Gas-X.

## 2015-06-12 ENCOUNTER — Encounter: Payer: Self-pay | Admitting: Family Medicine

## 2015-06-12 ENCOUNTER — Ambulatory Visit (INDEPENDENT_AMBULATORY_CARE_PROVIDER_SITE_OTHER): Payer: 59 | Admitting: Family Medicine

## 2015-06-12 VITALS — BP 132/88 | HR 64 | Ht 63.0 in | Wt 144.0 lb

## 2015-06-12 DIAGNOSIS — R238 Other skin changes: Secondary | ICD-10-CM | POA: Diagnosis not present

## 2015-06-12 DIAGNOSIS — R233 Spontaneous ecchymoses: Secondary | ICD-10-CM

## 2015-06-12 LAB — CBC WITH DIFFERENTIAL/PLATELET
BASOS PCT: 1 % (ref 0–1)
Basophils Absolute: 0 10*3/uL (ref 0.0–0.1)
Eosinophils Absolute: 0.1 10*3/uL (ref 0.0–0.7)
Eosinophils Relative: 3 % (ref 0–5)
HEMATOCRIT: 38 % (ref 36.0–46.0)
Hemoglobin: 12.8 g/dL (ref 12.0–15.0)
LYMPHS PCT: 34 % (ref 12–46)
Lymphs Abs: 1.4 10*3/uL (ref 0.7–4.0)
MCH: 30.6 pg (ref 26.0–34.0)
MCHC: 33.7 g/dL (ref 30.0–36.0)
MCV: 90.9 fL (ref 78.0–100.0)
MONO ABS: 0.3 10*3/uL (ref 0.1–1.0)
MPV: 10.3 fL (ref 8.6–12.4)
Monocytes Relative: 7 % (ref 3–12)
NEUTROS ABS: 2.2 10*3/uL (ref 1.7–7.7)
NEUTROS PCT: 55 % (ref 43–77)
Platelets: 269 10*3/uL (ref 150–400)
RBC: 4.18 MIL/uL (ref 3.87–5.11)
RDW: 14.5 % (ref 11.5–15.5)
WBC: 4 10*3/uL (ref 4.0–10.5)

## 2015-06-12 LAB — COMPREHENSIVE METABOLIC PANEL
ALBUMIN: 4.2 g/dL (ref 3.6–5.1)
ALT: 16 U/L (ref 6–29)
AST: 16 U/L (ref 10–35)
Alkaline Phosphatase: 67 U/L (ref 33–130)
BUN: 17 mg/dL (ref 7–25)
CALCIUM: 9.5 mg/dL (ref 8.6–10.4)
CO2: 26 meq/L (ref 20–31)
Chloride: 103 mEq/L (ref 98–110)
Creat: 0.62 mg/dL (ref 0.50–0.99)
GLUCOSE: 100 mg/dL — AB (ref 65–99)
POTASSIUM: 4.9 meq/L (ref 3.5–5.3)
Sodium: 137 mEq/L (ref 135–146)
TOTAL PROTEIN: 6.5 g/dL (ref 6.1–8.1)
Total Bilirubin: 0.4 mg/dL (ref 0.2–1.2)

## 2015-06-12 NOTE — Progress Notes (Signed)
   Subjective:    Patient ID: Jodi Duffy, female    DOB: Apr 05, 1951, 64 y.o.   MRN: [id number]  HPI She complains of noticing bruising with very little trauma in the last several weeks to month. She notes this on her arms as well as torso and hip area. She's had no epistaxis, bleeding from the gums, coughing up blood, seeing blood in her stool or urine. She does take an NSAID regularly.   Review of Systems     Objective:   Physical Exam Alert and in no distress. No petechiae noted in her skin. She does have a few scattered ecchymotic areas.       Assessment & Plan:  Easy bruising - Plan: CBC with Differential/Platelet, Comprehensive metabolic panel, PT AND PTT  I explained that I did not think we would find anything but worthwhile to make sure that there is no hematologic abnormalities. She is comfortable with this.

## 2015-06-13 LAB — PROTIME-INR
INR: 0.93 (ref <1.50)
PROTHROMBIN TIME: 12.5 s (ref 11.6–15.2)

## 2015-06-13 LAB — APTT: aPTT: 29 seconds (ref 24–37)

## 2015-09-11 ENCOUNTER — Telehealth: Payer: Self-pay

## 2015-09-11 NOTE — Telephone Encounter (Signed)
Refill request for Atorvastatin 10mg  #90. I called and LM to CB to schedule a med check

## 2015-09-12 ENCOUNTER — Other Ambulatory Visit: Payer: Self-pay | Admitting: Family Medicine

## 2015-09-12 NOTE — Telephone Encounter (Signed)
This has been done.

## 2015-09-24 ENCOUNTER — Other Ambulatory Visit: Payer: Self-pay

## 2015-09-24 ENCOUNTER — Telehealth: Payer: Self-pay | Admitting: Family Medicine

## 2015-09-24 NOTE — Telephone Encounter (Signed)
This was refilled 09/12/15

## 2015-09-24 NOTE — Telephone Encounter (Signed)
Pt called and made an appt for dec. Please refill generic lipitor to [facility]

## 2015-10-16 ENCOUNTER — Ambulatory Visit (INDEPENDENT_AMBULATORY_CARE_PROVIDER_SITE_OTHER): Payer: 59 | Admitting: Family Medicine

## 2015-10-16 ENCOUNTER — Encounter: Payer: Self-pay | Admitting: Family Medicine

## 2015-10-16 VITALS — BP 140/100 | HR 90 | Wt 147.0 lb

## 2015-10-16 DIAGNOSIS — R42 Dizziness and giddiness: Secondary | ICD-10-CM

## 2015-10-16 DIAGNOSIS — Z1159 Encounter for screening for other viral diseases: Secondary | ICD-10-CM

## 2015-10-16 DIAGNOSIS — Z79899 Other long term (current) drug therapy: Secondary | ICD-10-CM

## 2015-10-16 DIAGNOSIS — E785 Hyperlipidemia, unspecified: Secondary | ICD-10-CM | POA: Diagnosis not present

## 2015-10-16 DIAGNOSIS — R030 Elevated blood-pressure reading, without diagnosis of hypertension: Secondary | ICD-10-CM | POA: Diagnosis not present

## 2015-10-16 DIAGNOSIS — IMO0001 Reserved for inherently not codable concepts without codable children: Secondary | ICD-10-CM

## 2015-10-16 DIAGNOSIS — J301 Allergic rhinitis due to pollen: Secondary | ICD-10-CM | POA: Diagnosis not present

## 2015-10-16 LAB — COMPREHENSIVE METABOLIC PANEL
ALBUMIN: 4.5 g/dL (ref 3.6–5.1)
ALK PHOS: 67 U/L (ref 33–130)
ALT: 15 U/L (ref 6–29)
AST: 15 U/L (ref 10–35)
BUN: 20 mg/dL (ref 7–25)
CHLORIDE: 103 mmol/L (ref 98–110)
CO2: 28 mmol/L (ref 20–31)
CREATININE: 1.33 mg/dL — AB (ref 0.50–0.99)
Calcium: 9.7 mg/dL (ref 8.6–10.4)
Glucose, Bld: 111 mg/dL — ABNORMAL HIGH (ref 65–99)
POTASSIUM: 4.7 mmol/L (ref 3.5–5.3)
Sodium: 138 mmol/L (ref 135–146)
TOTAL PROTEIN: 6.8 g/dL (ref 6.1–8.1)
Total Bilirubin: 0.4 mg/dL (ref 0.2–1.2)

## 2015-10-16 LAB — CBC WITH DIFFERENTIAL/PLATELET
BASOS ABS: 0.1 10*3/uL (ref 0.0–0.1)
Basophils Relative: 1 % (ref 0–1)
Eosinophils Absolute: 0.1 10*3/uL (ref 0.0–0.7)
Eosinophils Relative: 2 % (ref 0–5)
HCT: 40.8 % (ref 36.0–46.0)
HEMOGLOBIN: 13.5 g/dL (ref 12.0–15.0)
LYMPHS ABS: 1.4 10*3/uL (ref 0.7–4.0)
LYMPHS PCT: 26 % (ref 12–46)
MCH: 30 pg (ref 26.0–34.0)
MCHC: 33.1 g/dL (ref 30.0–36.0)
MCV: 90.7 fL (ref 78.0–100.0)
MONOS PCT: 7 % (ref 3–12)
MPV: 10.4 fL (ref 8.6–12.4)
Monocytes Absolute: 0.4 10*3/uL (ref 0.1–1.0)
NEUTROS ABS: 3.5 10*3/uL (ref 1.7–7.7)
NEUTROS PCT: 64 % (ref 43–77)
Platelets: 294 10*3/uL (ref 150–400)
RBC: 4.5 MIL/uL (ref 3.87–5.11)
RDW: 14 % (ref 11.5–15.5)
WBC: 5.5 10*3/uL (ref 4.0–10.5)

## 2015-10-16 LAB — LIPID PANEL
Cholesterol: 198 mg/dL (ref 125–200)
HDL: 67 mg/dL (ref >=46)
LDL CALC: 83 mg/dL (ref <130)
TRIGLYCERIDES: 240 mg/dL — AB (ref <150)
Total CHOL/HDL Ratio: 3 Ratio (ref <=5.0)
VLDL: 48 mg/dL — ABNORMAL HIGH (ref <30)

## 2015-10-16 LAB — TSH: TSH: 1.948 u[IU]/mL (ref 0.350–4.500)

## 2015-10-16 NOTE — Progress Notes (Signed)
   Subjective:    Patient ID: Jodi Duffy, female    DOB: [birth year], 64 y.o.   MRN: [id number]  HPI  she complains of a one-month history of being unsteady. She has had no blurred vision, double vision, headache, weakness, heart rate changes , numbness or tingling. She does have underlying allergies however they are under good control. Review his record also indicates she has had intermittent elevated blood pressures.   Review of Systems     Objective:   Physical Exam Alert and in no distress.  EOMI. Other cranial nerves grossly intact. DTRs normal. Cerebellar testing normal. Normal gait with walking.Tympanic membranes and canals are normal. Pharyngeal area is normal. Neck is supple without adenopathy or thyromegaly. Cardiac exam shows a regular sinus rhythm without murmurs or gallops. Lungs are clear to auscultation.         Assessment & Plan:  Dizziness - Plan: CBC with Differential/Platelet, Comprehensive metabolic panel, TSH  Allergic rhinitis due to pollen  Elevated blood pressure  Need for hepatitis C screening test - Plan: Hepatitis C antibody  Hyperlipidemia LDL goal <100 - Plan: Lipid panel  Encounter for long-term (current) use of medications  she is scheduled for complete exam within the next month so blood work was done in preparation for that. Discussed getting the blood work and follow-up. At this point there is no good reason for her being unsteady. We will also readdress the blood pressure with her next visit as she might need to be placed on a medication. May consider further neurologic evaluation pending blood results and follow-up.

## 2015-10-17 LAB — HEPATITIS C ANTIBODY: HCV AB: NEGATIVE

## 2015-11-15 ENCOUNTER — Ambulatory Visit (INDEPENDENT_AMBULATORY_CARE_PROVIDER_SITE_OTHER): Payer: BLUE CROSS/BLUE SHIELD | Admitting: Family Medicine

## 2015-11-15 ENCOUNTER — Encounter: Payer: Self-pay | Admitting: Family Medicine

## 2015-11-15 VITALS — BP 124/72 | HR 64 | Ht 62.5 in | Wt 145.0 lb

## 2015-11-15 DIAGNOSIS — Z803 Family history of malignant neoplasm of breast: Secondary | ICD-10-CM | POA: Diagnosis not present

## 2015-11-15 DIAGNOSIS — H409 Unspecified glaucoma: Secondary | ICD-10-CM

## 2015-11-15 DIAGNOSIS — R748 Abnormal levels of other serum enzymes: Secondary | ICD-10-CM | POA: Diagnosis not present

## 2015-11-15 DIAGNOSIS — Z0000 Encounter for general adult medical examination without abnormal findings: Secondary | ICD-10-CM | POA: Diagnosis not present

## 2015-11-15 DIAGNOSIS — J302 Other seasonal allergic rhinitis: Secondary | ICD-10-CM | POA: Insufficient documentation

## 2015-11-15 DIAGNOSIS — Z96651 Presence of right artificial knee joint: Secondary | ICD-10-CM | POA: Diagnosis not present

## 2015-11-15 DIAGNOSIS — E785 Hyperlipidemia, unspecified: Secondary | ICD-10-CM

## 2015-11-15 DIAGNOSIS — F341 Dysthymic disorder: Secondary | ICD-10-CM | POA: Diagnosis not present

## 2015-11-15 DIAGNOSIS — R7989 Other specified abnormal findings of blood chemistry: Secondary | ICD-10-CM

## 2015-11-15 DIAGNOSIS — Z8249 Family history of ischemic heart disease and other diseases of the circulatory system: Secondary | ICD-10-CM

## 2015-11-15 DIAGNOSIS — Z23 Encounter for immunization: Secondary | ICD-10-CM | POA: Diagnosis not present

## 2015-11-15 LAB — POCT URINALYSIS DIPSTICK
Bilirubin, UA: NEGATIVE
Blood, UA: NEGATIVE
Glucose, UA: NEGATIVE
KETONES UA: NEGATIVE
Leukocytes, UA: NEGATIVE
Nitrite, UA: NEGATIVE
PH UA: 6
SPEC GRAV UA: 1.025
UROBILINOGEN UA: NEGATIVE

## 2015-11-15 MED ORDER — ATORVASTATIN CALCIUM 10 MG PO TABS: 10.0000 mg | ORAL_TABLET | Freq: Every day | ORAL | Status: DC

## 2015-11-15 MED ORDER — VENLAFAXINE HCL 75 MG PO TABS: 75.0000 mg | ORAL_TABLET | Freq: Every day | ORAL | Status: DC

## 2015-11-15 NOTE — Progress Notes (Signed)
Subjective:    Patient ID: Jodi Duffy, female    DOB: 05-15-1951, 64 y.o.   MRN: [id number]  HPI She is here for complete examination. She does have an underlying history of dysthymia and is doing quite well on Effexor. She has no desire to quit. She does have a history of glaucoma and is being followed by ophthalmology for this. Apparently her last studies were quite good. She does have seasonal allergies and has these under good control. She continues on Lipitor for her hyperlipidemia. There is also a family history of heart disease. She has seen cardiology in the past prior to her total knee replacement. She is doing quite nicely with the knee replacement. Health maintenance, immunizations, family and social history was also reviewed. Her marriage is going quite well. Work keeps her quite busy. She is considering retiring within the next several years. Review of her lab done does show a slightly elevated creatinine.  Review of Systems  All other systems reviewed and are negative. BP 124/72 mmHg  Pulse 64  Ht 5' 2.5" (1.588 m)  Wt 145 lb (65.772 kg)  BMI 26.08 kg/m2   Objective:   Physical Exam BP 124/72 mmHg  Pulse 64  Ht 5' 2.5" (1.588 m)  Wt 145 lb (65.772 kg)  BMI 26.08 kg/m2  General Appearance:    Alert, cooperative, no distress, appears stated age  Head:    Normocephalic, without obvious abnormality, atraumatic  Eyes:    PERRL, conjunctiva/corneas clear, EOM's intact, fundi    benign  Ears:    Normal TM's and external ear canals  Nose:   Nares normal, mucosa normal, no drainage or sinus   tenderness  Throat:   Lips, mucosa, and tongue normal; teeth and gums normal  Neck:   Supple, no lymphadenopathy;  thyroid:  no   enlargement/tenderness/nodules; no carotid   bruit or JVD  Back:    Spine nontender, no curvature, ROM normal, no CVA     tenderness  Lungs:     Clear to auscultation bilaterally without wheezes, rales or     ronchi; respirations unlabored  Chest Wall:     No tenderness or deformity   Heart:    Regular rate and rhythm, S1 and S2 normal, no murmur, rub   or gallop  Breast Exam:    Deferred to GYN  Abdomen:     Soft, non-tender, nondistended, normoactive bowel sounds,    no masses, no hepatosplenomegaly  Genitalia:    Deferred to GYN     Extremities:   No clubbing, cyanosis or edema  Pulses:   2+ and symmetric all extremities  Skin:   Skin color, texture, turgor normal, no rashes or lesions  Lymph nodes:   Cervical, supraclavicular, and axillary nodes normal  Neurologic:   CNII-XII intact, normal strength, sensation and gait; reflexes 2+ and symmetric throughout          Psych:   Normal mood, affect, hygiene and grooming.          Assessment & Plan:  Routine general medical examination at a health care facility - Plan: POCT urinalysis dipstick  Dysthymia - Plan: venlafaxine (EFFEXOR) 75 MG tablet  Family history of breast cancer in mother  Family history of heart disease in female family member before age 68 - Plan: atorvastatin (LIPITOR) 10 MG tablet  Glaucoma  Hyperlipidemia LDL goal <100 - Plan: atorvastatin (LIPITOR) 10 MG tablet  Status post total right knee replacement  Other seasonal allergic rhinitis  Need for prophylactic vaccination against Streptococcus pneumoniae (pneumococcus) - Plan: Pneumococcal conjugate vaccine 13-valent  Elevated serum creatinine - Plan: Basic Metabolic Panel  her medications were reviewed. Encouraged her to continue remain quite physically active.

## 2015-11-16 LAB — BASIC METABOLIC PANEL
BUN: 19 mg/dL (ref 7–25)
CALCIUM: 9.2 mg/dL (ref 8.6–10.4)
CHLORIDE: 102 mmol/L (ref 98–110)
CO2: 23 mmol/L (ref 20–31)
CREATININE: 0.62 mg/dL (ref 0.50–0.99)
Glucose, Bld: 109 mg/dL — ABNORMAL HIGH (ref 65–99)
Potassium: 4.5 mmol/L (ref 3.5–5.3)
Sodium: 140 mmol/L (ref 135–146)

## 2015-12-24 ENCOUNTER — Other Ambulatory Visit: Payer: Self-pay | Admitting: Family Medicine

## 2016-01-04 ENCOUNTER — Encounter: Payer: Self-pay | Admitting: Family Medicine

## 2016-01-04 ENCOUNTER — Ambulatory Visit (INDEPENDENT_AMBULATORY_CARE_PROVIDER_SITE_OTHER): Payer: BLUE CROSS/BLUE SHIELD | Admitting: Family Medicine

## 2016-01-04 VITALS — BP 128/80 | HR 64 | Temp 98.3°F | Wt 145.8 lb

## 2016-01-04 DIAGNOSIS — R05 Cough: Secondary | ICD-10-CM | POA: Diagnosis not present

## 2016-01-04 DIAGNOSIS — J069 Acute upper respiratory infection, unspecified: Secondary | ICD-10-CM | POA: Diagnosis not present

## 2016-01-04 DIAGNOSIS — R059 Cough, unspecified: Secondary | ICD-10-CM

## 2016-01-04 MED ORDER — AZITHROMYCIN 250 MG PO TABS: ORAL_TABLET | ORAL | Status: DC

## 2016-01-04 MED ORDER — HYDROCOD POLST-CPM POLST ER 10-8 MG/5ML PO SUER: 5.0000 mL | Freq: Every evening | ORAL | Status: DC | PRN

## 2016-01-04 NOTE — Progress Notes (Signed)
Subjective:  Jodi Duffy is a 65 y.o. female who presents for a 3-4 day history of sore throat, cough, hoarseness, sneezing, fullness to both ears. She is most concerned about her cough and states she gets bronchitis almost annually.    Denies fever, chills, body aches, nausea, vomiting, shortness of breath.  Treatment to date: cough suppressants.  Postive sick contacts.  No other aggravating or relieving factors.  No other c/o.  ROS as in subjective.   Objective: Filed Vitals:   01/04/16 1049  BP: 128/80  Pulse: 64  Temp: 98.3 F (36.8 C)    General appearance: Alert, WD/WN, no distress, mildly ill appearing                             Skin: warm, no rash                           Head: no sinus tenderness                            Eyes: conjunctiva normal, corneas clear, PERRLA                            Ears: pearly TMs, external ear canals normal                          Nose: septum midline, turbinates swollen, with erythema and clear discharge             Mouth/throat: MMM, tongue normal, mild pharyngeal erythema                           Neck: supple, no adenopathy, no thyromegaly, nontender                          Heart: RRR, normal S1, S2, no murmurs                         Lungs: CTA bilaterally, no wheezes, rales, or rhonchi     Assessment: Cough  Acute upper respiratory infection   Plan: Discussed diagnosis and treatment of URI.  Suggested symptomatic OTC remedies such as saltwater gargles for sore throat and staying well hydrated.. Nasal saline spray for congestion.  Tylenol or Ibuprofen OTC for fever and malaise. Since her symptoms started only 3-4 days ago, discussed that it is difficult to discern whether her symptoms are related to viral or bacterial etiology. Discussed giving her illness 2-3 more days and see if she turns the corner. Z-Pak prescription sent to pharmacy. Call/return if you are not back to normal after completing the antibiotic, if you  decide to take it.

## 2016-01-04 NOTE — Patient Instructions (Addendum)
If you decide to take the antibiotic, call/return if you are not 100% back to normal. Use salt water gargles for throat discomfort. Take the Tussionex as needed for cough at bedtime

## 2016-04-02 ENCOUNTER — Telehealth: Payer: Self-pay | Admitting: Family Medicine

## 2016-04-02 NOTE — Telephone Encounter (Signed)
Her last blood work looked good.Have her come in for nurse visit to draw a CBC

## 2016-04-02 NOTE — Telephone Encounter (Addendum)
Pt was turned down by [facility] to donate blood due to low iron level. Pt wants to know if she should be on iron supplement based on last labs done in our office. Call pt at [phone]

## 2016-04-03 ENCOUNTER — Other Ambulatory Visit: Payer: Self-pay

## 2016-04-03 DIAGNOSIS — E611 Iron deficiency: Secondary | ICD-10-CM

## 2016-04-03 NOTE — Telephone Encounter (Signed)
I have called and left pt message to call and make appointment for a nurse visit Dr.lalonde wanted to see blood work before he make decision on med I have put future order in

## 2016-04-04 ENCOUNTER — Other Ambulatory Visit: Payer: BLUE CROSS/BLUE SHIELD

## 2016-04-04 DIAGNOSIS — E611 Iron deficiency: Secondary | ICD-10-CM

## 2016-04-04 LAB — CBC WITH DIFFERENTIAL/PLATELET
Basophils Absolute: 42 cells/uL (ref 0–200)
Basophils Relative: 1 %
EOS PCT: 2 %
Eosinophils Absolute: 84 cells/uL (ref 15–500)
HCT: 40 % (ref 35.0–45.0)
HEMOGLOBIN: 13.1 g/dL (ref 11.7–15.5)
LYMPHS ABS: 1218 {cells}/uL (ref 850–3900)
Lymphocytes Relative: 29 %
MCH: 30 pg (ref 27.0–33.0)
MCHC: 32.8 g/dL (ref 32.0–36.0)
MCV: 91.5 fL (ref 80.0–100.0)
MONO ABS: 378 {cells}/uL (ref 200–950)
MPV: 10.5 fL (ref 7.5–12.5)
Monocytes Relative: 9 %
NEUTROS ABS: 2478 {cells}/uL (ref 1500–7800)
Neutrophils Relative %: 59 %
Platelets: 269 10*3/uL (ref 140–400)
RBC: 4.37 MIL/uL (ref 3.80–5.10)
RDW: 14.5 % (ref 11.0–15.0)
WBC: 4.2 10*3/uL (ref 4.0–10.5)

## 2016-06-11 LAB — HM MAMMOGRAPHY: HM MAMMO: NORMAL (ref 0–4)

## 2016-06-11 LAB — HM PAP SMEAR: HM Pap smear: NORMAL

## 2016-09-08 ENCOUNTER — Encounter: Payer: Self-pay | Admitting: Family Medicine

## 2016-09-08 ENCOUNTER — Ambulatory Visit (INDEPENDENT_AMBULATORY_CARE_PROVIDER_SITE_OTHER): Payer: BLUE CROSS/BLUE SHIELD | Admitting: Family Medicine

## 2016-09-08 VITALS — BP 120/84 | HR 83 | Temp 98.2°F | Wt 143.0 lb

## 2016-09-08 DIAGNOSIS — J209 Acute bronchitis, unspecified: Secondary | ICD-10-CM | POA: Diagnosis not present

## 2016-09-08 MED ORDER — AMOXICILLIN 875 MG PO TABS: 875.0000 mg | ORAL_TABLET | Freq: Two times a day (BID) | ORAL | 0 refills | Status: DC

## 2016-09-08 MED ORDER — HYDROCOD POLST-CPM POLST ER 10-8 MG/5ML PO SUER: 5.0000 mL | Freq: Two times a day (BID) | ORAL | 0 refills | Status: DC | PRN

## 2016-09-08 MED ORDER — BENZONATATE 100 MG PO CAPS: 200.0000 mg | ORAL_CAPSULE | Freq: Two times a day (BID) | ORAL | 0 refills | Status: DC | PRN

## 2016-09-08 NOTE — Progress Notes (Signed)
   Subjective:    Patient ID: Jodi Duffy, female    DOB: 04-28-1951, 66 y.o.   MRN: [id number]  HPI She complains of a 10 day history this started with sore throat followed by nasal congestion, PND but no fever, chills, earache. Yesterday her cough became productive. She does not smoke. Of note is the fact she has remote history of penicillin allergy however one year ago she was given penicillin at the dental office and had no reaction.   Review of Systems     Objective:   Physical Exam Alert and in no distress. Tympanic membranes and canals are normal. Pharyngeal area is normal. Neck is supple without adenopathy or thyromegaly. Cardiac exam shows a regular sinus rhythm without murmurs or gallops. Lungs are clear to auscultation.        Assessment & Plan:  Acute bronchitis, unspecified organism - Plan: amoxicillin (AMOXIL) 875 MG tablet, benzonatate (TESSALON) 100 MG capsule, chlorpheniramine-HYDROcodone (TUSSIONEX PENNKINETIC ER) 10-8 MG/5ML SUER Recommend the Amoxil and Tessalon and use Tussionex at night if she needs to. Cautioned that if she develops a rash, go to the emergency room.

## 2016-09-23 ENCOUNTER — Other Ambulatory Visit: Payer: Self-pay | Admitting: Family Medicine

## 2016-09-23 ENCOUNTER — Telehealth: Payer: Self-pay | Admitting: Family Medicine

## 2016-09-23 MED ORDER — LEVOFLOXACIN 500 MG PO TABS: 500.0000 mg | ORAL_TABLET | Freq: Every day | ORAL | 0 refills | Status: DC

## 2016-09-23 NOTE — Telephone Encounter (Signed)
Pt informed and verbalized understanding

## 2016-09-23 NOTE — Telephone Encounter (Signed)
Let her know that I called a different medication in

## 2016-09-23 NOTE — Telephone Encounter (Signed)
Pt called and states that she is not feeling any better, she finished her antibiotic and is still coughing and having the ear pain and having the stuff run down her throat, states she feels like crap again, would like to know if you would send her something else in or would like in to come back it, pt uses [id number] - [place], [place] - [address] AT [address] and pt  Can be reached at [phone]

## 2016-10-20 ENCOUNTER — Encounter: Payer: Self-pay | Admitting: Family Medicine

## 2016-10-20 ENCOUNTER — Ambulatory Visit (INDEPENDENT_AMBULATORY_CARE_PROVIDER_SITE_OTHER): Payer: BLUE CROSS/BLUE SHIELD | Admitting: Family Medicine

## 2016-10-20 ENCOUNTER — Ambulatory Visit
Admission: RE | Admit: 2016-10-20 | Discharge: 2016-10-20 | Disposition: A | Discharge status: Home / Self Care | Payer: BLUE CROSS/BLUE SHIELD | Source: Ambulatory Visit | Attending: Family Medicine | Admitting: Family Medicine

## 2016-10-20 VITALS — BP 140/90 | HR 90 | Wt 144.0 lb

## 2016-10-20 DIAGNOSIS — R079 Chest pain, unspecified: Secondary | ICD-10-CM

## 2016-10-20 DIAGNOSIS — Z8249 Family history of ischemic heart disease and other diseases of the circulatory system: Secondary | ICD-10-CM | POA: Diagnosis not present

## 2016-10-20 NOTE — Progress Notes (Signed)
   Subjective:    Patient ID: Jodi Duffy, female    DOB: 01/24/1951, 65 y.o.   MRN: [id number]  HPI She was seen in an emergency room on November 24 for evaluation of left-sided chest pain that was intermittent in nature. She was seen in an urgent care and then sent to the emergency room. She did bring some of the records with her. They recommended blood work including troponin but she did not wait for that. She states she is having some left-sided lateral chest pain but no DOE, PND, fatigue. She does not smoke. There is a positive family history for heart disease and she was seen by cardiology prior to knee surgery.   Review of Systems     Objective:   Physical Exam Alert and in no distress. Cardiac exam shows regular rhythm without murmurs or gallops. Lungs are clear to auscultation. There is some slight left upper and lateral chest discomfort on palpation.  EKG shows no acute changes. Chest x-ray is negative. Note from the urgent care center as well as EKG was reviewed.    Assessment & Plan:  Chest pain, unspecified type - Plan: DG Chest 2 View, EKG 12-Lead  Family history of heart disease in female family member before age 46 - Plan: DG Chest 2 View, EKG 12-Lead I left her a message after I got the x-ray report back indicating no further evaluation at this point is needed. Did recommend pain medication of choice and return here if symptoms cause more trouble.

## 2016-10-23 ENCOUNTER — Telehealth: Payer: Self-pay | Admitting: Family Medicine

## 2016-10-23 NOTE — Telephone Encounter (Signed)
Called and left message with pt to see if she has had a flu shot or to see if she would like to come in for one this year

## 2016-11-26 ENCOUNTER — Telehealth: Payer: Self-pay

## 2016-11-26 NOTE — Telephone Encounter (Signed)
Records from UC visit 10/10/2016 placed in your folder for review. Victorino December

## 2016-12-23 ENCOUNTER — Other Ambulatory Visit: Payer: Self-pay | Admitting: Family Medicine

## 2016-12-23 DIAGNOSIS — E785 Hyperlipidemia, unspecified: Secondary | ICD-10-CM

## 2016-12-23 DIAGNOSIS — Z8249 Family history of ischemic heart disease and other diseases of the circulatory system: Secondary | ICD-10-CM

## 2016-12-23 NOTE — Telephone Encounter (Signed)
LM on VCM that she is due for physical for lipid check. 30 day supply lipitor sent to pharmacy. Jodi Duffy December

## 2017-01-09 ENCOUNTER — Encounter: Payer: Self-pay | Admitting: Family Medicine

## 2017-01-29 ENCOUNTER — Other Ambulatory Visit: Payer: Self-pay | Admitting: Family Medicine

## 2017-01-29 DIAGNOSIS — E785 Hyperlipidemia, unspecified: Secondary | ICD-10-CM

## 2017-01-29 DIAGNOSIS — Z8249 Family history of ischemic heart disease and other diseases of the circulatory system: Secondary | ICD-10-CM

## 2017-02-09 ENCOUNTER — Other Ambulatory Visit: Payer: Self-pay | Admitting: Family Medicine

## 2017-02-09 ENCOUNTER — Encounter: Payer: Self-pay | Admitting: Family Medicine

## 2017-02-09 ENCOUNTER — Ambulatory Visit (INDEPENDENT_AMBULATORY_CARE_PROVIDER_SITE_OTHER): Payer: BLUE CROSS/BLUE SHIELD | Admitting: Family Medicine

## 2017-02-09 VITALS — BP 130/80 | HR 78 | Ht 63.0 in | Wt 144.2 lb

## 2017-02-09 DIAGNOSIS — E785 Hyperlipidemia, unspecified: Secondary | ICD-10-CM | POA: Diagnosis not present

## 2017-02-09 DIAGNOSIS — Z8249 Family history of ischemic heart disease and other diseases of the circulatory system: Secondary | ICD-10-CM

## 2017-02-09 DIAGNOSIS — Z96651 Presence of right artificial knee joint: Secondary | ICD-10-CM

## 2017-02-09 DIAGNOSIS — Z23 Encounter for immunization: Secondary | ICD-10-CM

## 2017-02-09 DIAGNOSIS — Z0000 Encounter for general adult medical examination without abnormal findings: Secondary | ICD-10-CM

## 2017-02-09 DIAGNOSIS — H409 Unspecified glaucoma: Secondary | ICD-10-CM | POA: Diagnosis not present

## 2017-02-09 DIAGNOSIS — Z803 Family history of malignant neoplasm of breast: Secondary | ICD-10-CM | POA: Diagnosis not present

## 2017-02-09 DIAGNOSIS — J302 Other seasonal allergic rhinitis: Secondary | ICD-10-CM

## 2017-02-09 DIAGNOSIS — F341 Dysthymic disorder: Secondary | ICD-10-CM | POA: Diagnosis not present

## 2017-02-09 LAB — LIPID PANEL
CHOLESTEROL: 179 mg/dL (ref <200)
HDL: 64 mg/dL (ref >50)
LDL Cholesterol: 77 mg/dL (ref <100)
TRIGLYCERIDES: 191 mg/dL — AB (ref <150)
Total CHOL/HDL Ratio: 2.8 Ratio (ref <5.0)
VLDL: 38 mg/dL — AB (ref <30)

## 2017-02-09 LAB — CBC WITH DIFFERENTIAL/PLATELET
BASOS ABS: 43 {cells}/uL (ref 0–200)
BASOS PCT: 1 %
EOS PCT: 3 %
Eosinophils Absolute: 129 cells/uL (ref 15–500)
HCT: 39.4 % (ref 35.0–45.0)
HEMOGLOBIN: 13 g/dL (ref 11.7–15.5)
Lymphocytes Relative: 27 %
Lymphs Abs: 1161 cells/uL (ref 850–3900)
MCH: 30 pg (ref 27.0–33.0)
MCHC: 33 g/dL (ref 32.0–36.0)
MCV: 90.8 fL (ref 80.0–100.0)
MONOS PCT: 9 %
MPV: 10.5 fL (ref 7.5–12.5)
Monocytes Absolute: 387 cells/uL (ref 200–950)
Neutro Abs: 2580 cells/uL (ref 1500–7800)
Neutrophils Relative %: 60 %
PLATELETS: 285 10*3/uL (ref 140–400)
RBC: 4.34 MIL/uL (ref 3.80–5.10)
RDW: 13.9 % (ref 11.0–15.0)
WBC: 4.3 10*3/uL (ref 4.0–10.5)

## 2017-02-09 LAB — COMPREHENSIVE METABOLIC PANEL
ALBUMIN: 4 g/dL (ref 3.6–5.1)
ALK PHOS: 57 U/L (ref 33–130)
ALT: 18 U/L (ref 6–29)
AST: 15 U/L (ref 10–35)
BILIRUBIN TOTAL: 0.3 mg/dL (ref 0.2–1.2)
BUN: 17 mg/dL (ref 7–25)
CO2: 28 mmol/L (ref 20–31)
CREATININE: 0.58 mg/dL (ref 0.50–0.99)
Calcium: 9.3 mg/dL (ref 8.6–10.4)
Chloride: 108 mmol/L (ref 98–110)
Glucose, Bld: 111 mg/dL — ABNORMAL HIGH (ref 65–99)
Potassium: 4.8 mmol/L (ref 3.5–5.3)
SODIUM: 143 mmol/L (ref 135–146)
TOTAL PROTEIN: 6.4 g/dL (ref 6.1–8.1)

## 2017-02-09 LAB — POCT URINALYSIS DIPSTICK
Bilirubin, UA: NEGATIVE
GLUCOSE UA: NEGATIVE
Ketones, UA: NEGATIVE
NITRITE UA: NEGATIVE
PH UA: 6 (ref 5.0–8.0)
Protein, UA: NEGATIVE
RBC UA: NEGATIVE
Spec Grav, UA: 1.025 (ref 1.030–1.035)
UROBILINOGEN UA: NEGATIVE (ref ?–2.0)

## 2017-02-09 MED ORDER — VENLAFAXINE HCL 75 MG PO TABS: 75.0000 mg | ORAL_TABLET | Freq: Every day | ORAL | 3 refills | Status: DC

## 2017-02-09 MED ORDER — ATORVASTATIN CALCIUM 10 MG PO TABS: ORAL_TABLET | ORAL | 3 refills | Status: DC

## 2017-02-09 NOTE — Patient Instructions (Signed)
Try Rhinocort or Flonase

## 2017-02-09 NOTE — Progress Notes (Signed)
Subjective:    Patient ID: Jodi Duffy, female    DOB: Oct 10, 1951, 66 y.o.   MRN: [id number]  HPI She is here for complete examination. She has no particular concerns or complaints. She does use cetirizine/pseudoephedrine for her allergy symptoms and this seems to work fairly well for her. She continues on Effexor for treatment of dysthymia and is very happy with this. She has no interest in stopping this medicine. She continues on Lipitor and is having no aches or pains with that. She has had a knee replacement and is doing quite nicely with that. She sees her ophthalmologist regularly for her glaucoma and she does get regular Pap smears as well as mammograms and plans to continue this indefinitely. She keeps herself physically fit. Her marriage is going quite well. She is still working. Family and social history as well as health maintenance and immunizations were reviewed.  Review of Systems  All other systems reviewed and are negative.      Objective:   Physical Exam BP 130/80   Pulse 78   Ht 5\' 3"  (1.6 m)   Wt 144 lb 3.2 oz (65.4 kg)   SpO2 96%   BMI 25.54 kg/m   General Appearance:    Alert, cooperative, no distress, appears stated age  Head:    Normocephalic, without obvious abnormality, atraumatic  Eyes:    PERRL, conjunctiva/corneas clear, EOM's intact, fundi    benign  Ears:    Normal TM's and external ear canals  Nose:   Nares normal, mucosa normal, no drainage or sinus   tenderness  Throat:   Lips, mucosa, and tongue normal; teeth and gums normal  Neck:   Supple, no lymphadenopathy;  thyroid:  no   enlargement/tenderness/nodules; no carotid   bruit or JVD     Lungs:     Clear to auscultation bilaterally without wheezes, rales or     ronchi; respirations unlabored      Heart:    Regular rate and rhythm, S1 and S2 normal, no murmur, rub   or gallop  Breast Exam:    Deferred to GYN  Abdomen:     Soft, non-tender, nondistended, normoactive bowel sounds,    no  masses, no hepatosplenomegaly  Genitalia:    Deferred to GYN     Extremities:   No clubbing, cyanosis or edema  Pulses:   2+ and symmetric all extremities  Skin:   Skin color, texture, turgor normal, no rashes or lesions  Lymph nodes:   Cervical, supraclavicular, and axillary nodes normal  Neurologic:   CNII-XII intact, normal strength, sensation and gait; reflexes 2+ and symmetric throughout          Psych:   Normal mood, affect, hygiene and grooming.    She did check with her insurance concerning the new Shingrix and it is covered.      Assessment & Plan:  Family history of heart disease in female family member before age 7 - Plan: Lipid panel, atorvastatin (LIPITOR) 10 MG tablet  Dysthymia - Plan: venlafaxine (EFFEXOR) 75 MG tablet  Family history of breast cancer in mother  Routine general medical examination at a health care facility - Plan: POCT Urinalysis Dipstick, CBC with Differential/Platelet, Comprehensive metabolic panel  Hyperlipidemia LDL goal <100 - Plan: Lipid panel, atorvastatin (LIPITOR) 10 MG tablet  Status post total right knee replacement  Other seasonal allergic rhinitis  Glaucoma, unspecified glaucoma type, unspecified laterality She is doing well on her Lipitor as well as Effexor  and I will continue on that. Did recommend she try either Flonase or Rhinocort for her allergies to see if she can get off the oral medication. He follows up every 6 months concerning her glaucoma. She will continue with Pap as well as mammograms. Discussed getting Cologuard next year. Shingrix given. She will return here in 2-6 months for repeat. Discussed possible side effects including pain.

## 2017-02-11 LAB — HEMOGLOBIN A1C
HEMOGLOBIN A1C: 5.4 % (ref <5.7)
Mean Plasma Glucose: 108 mg/dL

## 2017-04-30 ENCOUNTER — Other Ambulatory Visit (INDEPENDENT_AMBULATORY_CARE_PROVIDER_SITE_OTHER): Payer: BLUE CROSS/BLUE SHIELD

## 2017-04-30 DIAGNOSIS — Z23 Encounter for immunization: Secondary | ICD-10-CM | POA: Diagnosis not present

## 2017-05-29 ENCOUNTER — Ambulatory Visit (INDEPENDENT_AMBULATORY_CARE_PROVIDER_SITE_OTHER): Payer: BLUE CROSS/BLUE SHIELD | Admitting: Orthopaedic Surgery

## 2017-05-29 ENCOUNTER — Ambulatory Visit (INDEPENDENT_AMBULATORY_CARE_PROVIDER_SITE_OTHER): Payer: Self-pay

## 2017-05-29 ENCOUNTER — Encounter (INDEPENDENT_AMBULATORY_CARE_PROVIDER_SITE_OTHER): Payer: Self-pay | Admitting: Orthopaedic Surgery

## 2017-05-29 VITALS — BP 164/91 | HR 76 | Ht 63.0 in | Wt 142.0 lb

## 2017-05-29 DIAGNOSIS — M542 Cervicalgia: Secondary | ICD-10-CM | POA: Diagnosis not present

## 2017-05-29 NOTE — Progress Notes (Signed)
Office Visit Note   Patient: Jodi Duffy           Date of Birth: 11-12-1951           MRN: [id number] Visit Date: 05/29/2017              Requested by: Denita Lung, MD [place], [place] [address] PCP: Denita Lung, MD   Assessment & Plan: Visit Diagnoses:  1. Neck pain     Plan: Prescription for prednisone 5 mg Dosepak given. She likely has some nerve root irritation with her brachial plexus tenderness on the left side and some pain radiating to her left arm near elbow. She'll take medication with food if this doesn't settle her symptoms then she'll call back to the office to let us  know.  Follow-Up Instructions: No Follow-up on file.   Orders:  Orders Placed This Encounter  Procedures  . XR Cervical Spine 2 or 3 views   No orders of the defined types were placed in this encounter.     Procedures: No procedures performed   Clinical Data: No additional findings.   Subjective: Chief Complaint  Patient presents with  . Left Elbow - Pain    HPI 66 year old female with left nondominant arm and forearm pain. She's had the pain for about 10 days no definite injury. She knows when she lifted up a box of file she had trouble lifting with her left arm. Prior to that she is not sure if she's had any change in strength. She denies numbness or tingling in her hand. She's used Aleve without relief. She's been able to play golf without problems and is going to play later this afternoon. No lower extremity pain symptoms negative rheumatologic conditions other than that total knee arthroplasty by Dr. Durward Fortes which is doing well. The pain she describes as a burning or aching pain. It lasts for several minutes doesn't seem to be caused by any particular movement she's having it multiple times a day.  Review of Systems Review of systems positive for previous total knee arthroplasty, family history of heart disease and breast cancer. Patient has  hyperlipidemia previous the jaw osteotomy for TMJ syndrome. Otherwise negative as it pertains history of present illness. She denies chest pain.  Objective: Vital Signs: BP (!) 164/91   Pulse 76   Ht 5\' 3"  (1.6 m)   Wt 142 lb (64.4 kg)   BMI 25.15 kg/m   Physical Exam  Constitutional: She is oriented to person, place, and time. She appears well-developed.  HENT:  Head: Normocephalic.  Right Ear: External ear normal.  Left Ear: External ear normal.  Eyes: Pupils are equal, round, and reactive to light.  Neck: No tracheal deviation present. No thyromegaly present.  Cardiovascular: Normal rate.   Pulmonary/Chest: Effort normal.  Abdominal: Soft.  Musculoskeletal:  Patient has no axillary and no supraclavicular lymphadenopathy. No pain with cervical compression no change with distraction. Negative were made name is Spurling. She has the brachial plexus tenderness on the left side which is moderate to severe negative on the right side. Upper extremity reflexes are 1+ and symmetrical. Normal heel toe gait.  Neurological: She is alert and oriented to person, place, and time.  Skin: Skin is warm and dry.  Psychiatric: She has a normal mood and affect. Her behavior is normal.    Ortho Exam negative left shoulder impingement. No weakness internal/external rotation negative drop arm test long head of the biceps is normal.  Medial lateral epicondyles are normal palpation ulnar nerve is stable no subluxation in the cubital tunnel. No tenderness of the radial nerve in the mid arm. Biceps triceps are strong.  Specialty Comments:  No specialty comments available.  Imaging: No results found.   PMFS History: Patient Active Problem List   Diagnosis Date Noted  . Other seasonal allergic rhinitis 11/15/2015  . Family history of breast cancer in mother 09/06/2014  . Dysthymia 09/06/2014  . Glaucoma 09/06/2014  . Hyperlipidemia LDL goal <100 09/06/2014  . S/P TKR (total knee replacement)  08/12/2012  . Family history of heart disease in female family member before age 18 07/22/2012   Past Medical History:  Diagnosis Date  . Allergy    RHINITIS  . Arthritis   . Depression   . Osteopenia   . TMJ syndrome     Family History  Problem Relation Age of Onset  . Heart disease Mother        valve replacement  . Cancer Mother        breast, throat  . Heart disease Father        died of brain embolism after hip surgery  . Arthritis Father   . Arthritis Brother   . Cancer Maternal Aunt        various cancers  . Diabetes Neg Hx   . Hypertension Neg Hx   . Stroke Neg Hx     Past Surgical History:  Procedure Laterality Date  . BIOPSY BREAST    . KNEE ARTHROSCOPY     right knee, 1990 and 2012, Dr. Durward Fortes  . TEMPOROMANDIBULAR JOINT SURGERY    . TONSILLECTOMY    . TOTAL KNEE ARTHROPLASTY  08/10/2012   Procedure: TOTAL KNEE ARTHROPLASTY;  Surgeon: Garald Balding, MD;  Location: [facility];  Service: Orthopedics;  Laterality: Right;  Right Total Knee Arthroplasty   Social History   Occupational History  . 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 Smoking status: Former Smoker    Quit date: 02/10/1980  . Smokeless tobacco: Never Used  . Alcohol use 0.6 oz/week    1 Glasses of wine per week  . Drug use: No  . Sexual activity: Yes

## 2017-06-09 ENCOUNTER — Ambulatory Visit (INDEPENDENT_AMBULATORY_CARE_PROVIDER_SITE_OTHER): Payer: BLUE CROSS/BLUE SHIELD | Admitting: Orthopaedic Surgery

## 2017-06-09 ENCOUNTER — Encounter (INDEPENDENT_AMBULATORY_CARE_PROVIDER_SITE_OTHER): Payer: Self-pay | Admitting: Orthopaedic Surgery

## 2017-06-09 VITALS — BP 187/89 | HR 76 | Ht 63.0 in | Wt 142.0 lb

## 2017-06-09 DIAGNOSIS — M47812 Spondylosis without myelopathy or radiculopathy, cervical region: Secondary | ICD-10-CM | POA: Diagnosis not present

## 2017-06-09 DIAGNOSIS — M79602 Pain in left arm: Secondary | ICD-10-CM | POA: Diagnosis not present

## 2017-06-16 ENCOUNTER — Other Ambulatory Visit (INDEPENDENT_AMBULATORY_CARE_PROVIDER_SITE_OTHER): Payer: Self-pay | Admitting: Orthopaedic Surgery

## 2017-06-16 DIAGNOSIS — M542 Cervicalgia: Secondary | ICD-10-CM

## 2017-06-16 DIAGNOSIS — M79602 Pain in left arm: Secondary | ICD-10-CM

## 2017-06-17 NOTE — Progress Notes (Signed)
Office Visit Note   Patient: Jodi Duffy           Date of Birth: Sep 26, 1951           MRN: [id number] Visit Date: 06/09/2017              Requested by: Denita Lung, MD [place], [place] [address] PCP: Denita Lung, MD   Assessment & Plan: Visit Diagnoses:  1. Left arm pain   2. Spondylosis without myelopathy or radiculopathy, cervical region     Plan: We discussed proceeding with the cervical MRI scan for her symptoms. Once we can see her back after MRI cervical.  Follow-Up Instructions: No Follow-up on file.   Orders:  No orders of the defined types were placed in this encounter.  No orders of the defined types were placed in this encounter.     Procedures: No procedures performed   Clinical Data: No additional findings.   Subjective: Chief Complaint  Patient presents with  . Left Arm - Pain    HPI patient returns for ongoing left arm pain in the forearm its intermittent lasts for short periods of time. It's a dull aching pain. She occasionally has had sharp pain near her scapula. She took a prednisone pack and got improvement and then at the end of the prednisone pack she had some recurrence of symptoms. She is currently taking Aleve. She denies fever chills no lower extremity problems. She's noticed some weakness on the left upper extremity when she lifts boxes of files. She is a Research officer, trade [employer]. Negative for rheumatologic condition although she has had a total knee arthroplasty by Dr. Durward Fortes for primary knee osteoarthritis.  Review of Systems 14 point review systems is updated is unchanged from 05/29/2017 office note 11 days ago.   Objective: Vital Signs: BP (!) 187/89   Pulse 76   Ht 5\' 3"  (1.6 m)   Wt 142 lb (64.4 kg)   BMI 25.15 kg/m   Physical Exam  Constitutional: She is oriented to person, place, and time. She appears well-developed.  HENT:  Head: Normocephalic.  Right Ear: External ear normal.  Left Ear:  External ear normal.  Eyes: Pupils are equal, round, and reactive to light.  Neck: No tracheal deviation present. No thyromegaly present.  Cardiovascular: Normal rate.   Pulmonary/Chest: Effort normal.  Abdominal: Soft.  Neurological: She is alert and oriented to person, place, and time.  Skin: Skin is warm and dry. No rash noted. No erythema.  Psychiatric: She has a normal mood and affect. Her behavior is normal.   positive for previous TMJ surgery with the jaw osteotomy well-healed.  Ortho Exam no supraclavicular lymphadenopathy. She has no change with cervical compression no improvement with distraction. Negative Spurling on the right and left. Brachioplexus tender on the left negative on the right. Reflexes are 1+ and symmetrical upper and lower extremities. Normal heel-to-toe gait. Negative left shoulder impingement. Axillary sensation over the deltoid is normal. Median nerve in the forearm or nerve at the elbow is normal. Epicondyles are nontender. She has good grip strength.  Specialty Comments:  No specialty comments available.  Imaging: Previous C-spine x-rays demonstrated some thoracic spondylosis. 1 mm anterolisthesis at C6-7 with mild disc space narrowing at C5-6 obtained on 05/29/2017   PMFS History: Patient Active Problem List   Diagnosis Date Noted  . Neck pain 05/29/2017  . Other seasonal allergic rhinitis 11/15/2015  . Family history of breast cancer in mother 09/06/2014  .  Dysthymia 09/06/2014  . Glaucoma 09/06/2014  . Hyperlipidemia LDL goal <100 09/06/2014  . S/P TKR (total knee replacement) 08/12/2012  . Family history of heart disease in female family member before age 38 07/22/2012   Past Medical History:  Diagnosis Date  . Allergy    RHINITIS  . Arthritis   . Depression   . Osteopenia   . TMJ syndrome     Family History  Problem Relation Age of Onset  . Heart disease Mother        valve replacement  . Cancer Mother        breast, throat  . Heart  disease Father        died of brain embolism after hip surgery  . Arthritis Father   . Arthritis Brother   . Cancer Maternal Aunt        various cancers  . Diabetes Neg Hx   . Hypertension Neg Hx   . Stroke Neg Hx     Past Surgical History:  Procedure Laterality Date  . BIOPSY BREAST    . KNEE ARTHROSCOPY     right knee, 1990 and 2012, Dr. Durward Fortes  . TEMPOROMANDIBULAR JOINT SURGERY    . TONSILLECTOMY    . TOTAL KNEE ARTHROPLASTY  08/10/2012   Procedure: TOTAL KNEE ARTHROPLASTY;  Surgeon: Garald Balding, MD;  Location: [facility];  Service: Orthopedics;  Laterality: Right;  Right Total Knee Arthroplasty   Social History   Occupational History  . 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 Smoking status: Former Smoker    Quit date: 02/10/1980  . Smokeless tobacco: Never Used  . Alcohol use 0.6 oz/week    1 Glasses of wine per week  . Drug use: No  . Sexual activity: Yes

## 2017-06-28 ENCOUNTER — Other Ambulatory Visit: Payer: BLUE CROSS/BLUE SHIELD

## 2017-09-08 ENCOUNTER — Ambulatory Visit (INDEPENDENT_AMBULATORY_CARE_PROVIDER_SITE_OTHER): Payer: BLUE CROSS/BLUE SHIELD

## 2017-09-08 ENCOUNTER — Encounter (INDEPENDENT_AMBULATORY_CARE_PROVIDER_SITE_OTHER): Payer: Self-pay | Admitting: Orthopaedic Surgery

## 2017-09-08 ENCOUNTER — Ambulatory Visit (INDEPENDENT_AMBULATORY_CARE_PROVIDER_SITE_OTHER): Payer: BLUE CROSS/BLUE SHIELD | Admitting: Orthopaedic Surgery

## 2017-09-08 VITALS — BP 171/97 | HR 80 | Resp 12 | Ht 64.0 in | Wt 145.0 lb

## 2017-09-08 DIAGNOSIS — M25562 Pain in left knee: Secondary | ICD-10-CM

## 2017-09-08 DIAGNOSIS — G8929 Other chronic pain: Secondary | ICD-10-CM

## 2017-09-08 DIAGNOSIS — M1712 Unilateral primary osteoarthritis, left knee: Secondary | ICD-10-CM | POA: Diagnosis not present

## 2017-09-08 MED ORDER — BUPIVACAINE HCL 0.5 % IJ SOLN
3.0000 mL | INTRAMUSCULAR | Status: AC | PRN
  Administered 2017-09-08: 3 mL via INTRA_ARTICULAR

## 2017-09-08 MED ORDER — METHYLPREDNISOLONE ACETATE 40 MG/ML IJ SUSP
80.0000 mg | INTRAMUSCULAR | Status: AC | PRN
  Administered 2017-09-08: 80 mg

## 2017-09-08 MED ORDER — LIDOCAINE HCL 1 % IJ SOLN
5.0000 mL | INTRAMUSCULAR | Status: AC | PRN
  Administered 2017-09-08: 5 mL

## 2017-09-08 NOTE — Progress Notes (Signed)
Office Visit Note   Patient: Jodi Duffy           Date of Birth: 1951-08-07           MRN: [id number] Visit Date: 09/08/2017              Requested by: Denita Lung, MD [place], [place] [address] PCP: Denita Lung, MD   Assessment & Plan: Visit Diagnoses:  1. Chronic pain of left knee   2. Unilateral primary osteoarthritis, left knee     Plan: On discussion regarding mild osteoarthritic changes left knee. We will start with a intra-articular cortisone injection and monitor her response. Return as needed  Follow-Up Instructions: Return if symptoms worsen or fail to improve.   Orders:  Orders Placed This Encounter  Procedures  . XR KNEE 3 VIEW LEFT   No orders of the defined types were placed in this encounter.     Procedures: Large Joint Inj Date/Time: 09/08/2017 2:37 PM Performed by: Garald Balding Authorized by: Garald Balding   Consent Given by:  Patient Timeout: prior to procedure the correct patient, procedure, and site was verified   Indications:  Pain and joint swelling Location:  Knee Site:  L knee Prep: patient was prepped and draped in usual sterile fashion   Needle Size:  25 G Needle Length:  1.5 inches Approach:  Anteromedial Ultrasound Guidance: No   Fluoroscopic Guidance: No   Arthrogram: No   Medications:  5 mL lidocaine 1 %; 80 mg methylPREDNISolone acetate 40 MG/ML; 3 mL bupivacaine 0.5 % Aspiration Attempted: No   Patient tolerance:  Patient tolerated the procedure well with no immediate complications     Clinical Data: No additional findings.   Subjective: Chief Complaint  Patient presents with  . Left Knee - Pain    Jodi Duffy is a 66 y o that presents with chronic Left knee pain. She relates no fevers, chills or calf pain. No numbness or tingling in the toes/foot.Hx of R TKA. Hurts when going down stairs or twisting. Denies injury  Arthritis 5 years status post primary right total knee  replacement doing very well without any compromise of her activities. She developed insidious onset of left knee pain approximately a month ago without injury or trauma. She's had some difficulty with inclines and with twisting. No sensation of her knee giving way or locking. Pain seems to be predominantly along the anterior aspect of her knee. Has been taking Aleve that does provide some relief  HPI  Review of Systems  Constitutional: Negative for chills, fatigue and fever.  Eyes: Positive for discharge. Negative for itching.  Respiratory: Negative for chest tightness and shortness of breath.   Cardiovascular: Negative for chest pain, palpitations and leg swelling.  Gastrointestinal: Negative for blood in stool, constipation and diarrhea.  Endocrine: Negative for polyuria.  Genitourinary: Negative for dysuria.  Musculoskeletal: Negative for back pain, joint swelling, neck pain and neck stiffness.  Allergic/Immunologic: Negative for immunocompromised state.  Neurological: Negative for dizziness and numbness.  Hematological: Does not bruise/bleed easily.  Psychiatric/Behavioral: The patient is not nervous/anxious.      Objective: Vital Signs: BP (!) 171/97   Pulse 80   Resp 12   Ht 5\' 4"  (1.626 m)   Wt 145 lb (65.8 kg)   BMI 24.89 kg/m   Physical Exam  Ortho Exam awake alert and oriented 3 comfortable sitting. Walk without a limp. Left knee exam without an effusion. Very minimal  patellar crepitation but with lateral patellar pain and some discomfort with patellar compression. No medial lateral joint pain. No instability. Full extension over 105 of flexion. Negative anterior drawer sign. Straight leg raise negative. Painless range of motion of both hips. Skin intact. No swelling distally  Specialty Comments:  No specialty comments available.  Imaging: Xr Knee 3 View Left  Result Date: 09/08/2017 The left knee were obtained 3 projections standing. There are some mild arthritic  changes in the medial lateral compartment. The joint spaces are well maintained. No ectopic calcification. There is more moderate arthritis at the patellofemoral joint with peripheral osteophytes and some decrease in the patellofemoral facet joints    PMFS History: Patient Active Problem List   Diagnosis Date Noted  . Neck pain 05/29/2017  . Other seasonal allergic rhinitis 11/15/2015  . Family history of breast cancer in mother 09/06/2014  . Dysthymia 09/06/2014  . Glaucoma 09/06/2014  . Hyperlipidemia LDL goal <100 09/06/2014  . S/P TKR (total knee replacement) 08/12/2012  . Family history of heart disease in female family member before age 77 07/22/2012   Past Medical History:  Diagnosis Date  . Allergy    RHINITIS  . Arthritis   . Depression   . Osteopenia   . TMJ syndrome     Family History  Problem Relation Age of Onset  . Heart disease Mother        valve replacement  . Cancer Mother        breast, throat  . Heart disease Father        died of brain embolism after hip surgery  . Arthritis Father   . Arthritis Brother   . Cancer Maternal Aunt        various cancers  . Diabetes Neg Hx   . Hypertension Neg Hx   . Stroke Neg Hx     Past Surgical History:  Procedure Laterality Date  . BIOPSY BREAST    . KNEE ARTHROSCOPY     right knee, 1990 and 2012, Dr. Durward Fortes  . TEMPOROMANDIBULAR JOINT SURGERY    . TONSILLECTOMY    . TOTAL KNEE ARTHROPLASTY  08/10/2012   Procedure: TOTAL KNEE ARTHROPLASTY;  Surgeon: Garald Balding, MD;  Location: [facility];  Service: Orthopedics;  Laterality: Right;  Right Total Knee Arthroplasty   Social History   Occupational History  . 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 Smoking status: Former Smoker    Quit date: 02/10/1980  . Smokeless tobacco: Never Used  . Alcohol use 0.6 oz/week    1 Glasses of wine per week  . Drug use: No  . Sexual activity: Yes

## 2017-12-23 DIAGNOSIS — H04221 Epiphora due to insufficient drainage, right lacrimal gland: Secondary | ICD-10-CM | POA: Diagnosis not present

## 2017-12-25 DIAGNOSIS — G510 Bell's palsy: Secondary | ICD-10-CM | POA: Diagnosis not present

## 2017-12-25 DIAGNOSIS — H04221 Epiphora due to insufficient drainage, right lacrimal gland: Secondary | ICD-10-CM | POA: Diagnosis not present

## 2017-12-25 DIAGNOSIS — H40033 Anatomical narrow angle, bilateral: Secondary | ICD-10-CM | POA: Diagnosis not present

## 2017-12-25 DIAGNOSIS — H40053 Ocular hypertension, bilateral: Secondary | ICD-10-CM | POA: Diagnosis not present

## 2017-12-25 DIAGNOSIS — H04123 Dry eye syndrome of bilateral lacrimal glands: Secondary | ICD-10-CM | POA: Diagnosis not present

## 2018-01-04 ENCOUNTER — Ambulatory Visit (INDEPENDENT_AMBULATORY_CARE_PROVIDER_SITE_OTHER): Payer: BLUE CROSS/BLUE SHIELD | Admitting: Orthopaedic Surgery

## 2018-01-05 DIAGNOSIS — Z87891 Personal history of nicotine dependence: Secondary | ICD-10-CM | POA: Diagnosis not present

## 2018-01-05 DIAGNOSIS — F419 Anxiety disorder, unspecified: Secondary | ICD-10-CM | POA: Diagnosis not present

## 2018-01-05 DIAGNOSIS — I10 Essential (primary) hypertension: Secondary | ICD-10-CM | POA: Diagnosis not present

## 2018-01-05 DIAGNOSIS — M8580 Other specified disorders of bone density and structure, unspecified site: Secondary | ICD-10-CM | POA: Diagnosis not present

## 2018-01-05 DIAGNOSIS — J309 Allergic rhinitis, unspecified: Secondary | ICD-10-CM | POA: Diagnosis not present

## 2018-01-05 DIAGNOSIS — F329 Major depressive disorder, single episode, unspecified: Secondary | ICD-10-CM | POA: Diagnosis not present

## 2018-01-05 DIAGNOSIS — H04221 Epiphora due to insufficient drainage, right lacrimal gland: Secondary | ICD-10-CM | POA: Diagnosis not present

## 2018-01-05 DIAGNOSIS — Z79899 Other long term (current) drug therapy: Secondary | ICD-10-CM | POA: Diagnosis not present

## 2018-01-05 DIAGNOSIS — H409 Unspecified glaucoma: Secondary | ICD-10-CM | POA: Diagnosis not present

## 2018-02-10 ENCOUNTER — Encounter: Payer: Self-pay | Admitting: Family Medicine

## 2018-02-10 ENCOUNTER — Other Ambulatory Visit: Payer: Self-pay | Admitting: Family Medicine

## 2018-02-10 ENCOUNTER — Ambulatory Visit (INDEPENDENT_AMBULATORY_CARE_PROVIDER_SITE_OTHER): Payer: Medicare Other | Admitting: Family Medicine

## 2018-02-10 VITALS — BP 150/88 | HR 74 | Ht 63.0 in | Wt 146.4 lb

## 2018-02-10 DIAGNOSIS — Z803 Family history of malignant neoplasm of breast: Secondary | ICD-10-CM

## 2018-02-10 DIAGNOSIS — F341 Dysthymic disorder: Secondary | ICD-10-CM

## 2018-02-10 DIAGNOSIS — Z8249 Family history of ischemic heart disease and other diseases of the circulatory system: Secondary | ICD-10-CM | POA: Diagnosis not present

## 2018-02-10 DIAGNOSIS — E785 Hyperlipidemia, unspecified: Secondary | ICD-10-CM | POA: Diagnosis not present

## 2018-02-10 DIAGNOSIS — Z96651 Presence of right artificial knee joint: Secondary | ICD-10-CM | POA: Diagnosis not present

## 2018-02-10 DIAGNOSIS — J302 Other seasonal allergic rhinitis: Secondary | ICD-10-CM | POA: Diagnosis not present

## 2018-02-10 DIAGNOSIS — Z1211 Encounter for screening for malignant neoplasm of colon: Secondary | ICD-10-CM

## 2018-02-10 DIAGNOSIS — H409 Unspecified glaucoma: Secondary | ICD-10-CM | POA: Diagnosis not present

## 2018-02-10 DIAGNOSIS — I10 Essential (primary) hypertension: Secondary | ICD-10-CM

## 2018-02-10 DIAGNOSIS — R7309 Other abnormal glucose: Secondary | ICD-10-CM | POA: Diagnosis not present

## 2018-02-10 MED ORDER — LOSARTAN POTASSIUM 50 MG PO TABS: 50.0000 mg | ORAL_TABLET | Freq: Every day | ORAL | 3 refills | Status: DC

## 2018-02-10 NOTE — Progress Notes (Signed)
Jodi Duffy is a 67 y.o. female who presents for annual wellness visit and follow-up on chronic medical conditions.  She has the following concerns: She does have underlying glaucoma and is followed by ophthalmology for that.  Apparently she is in need of some surgery to help with proper lacrimal drainage.  She continues on atorvastatin without difficulty.  She also takes Effexor and at this point is not interested in stopping the medication.  She gets regular mammograms.  She keeps himself quite physically active although she has fallen several times she does not do the yoga as well as core strengthening type exercises.  She also plays golf.  She does have symptoms suggestive of postural hypotension but does note to go slowly from moving from one position to another.  Her allergies are under good control.  She is doing quite nicely with her knee replacement and is considering having another one in the near future.  She has no other concerns or complaints.  Immunizations and Health Maintenance Immunization History  Administered Date(s) Administered  . DTaP 08/02/1998  . Influenza Split 08/11/2012  . Influenza Whole 09/04/2008, 08/27/2010  . Influenza, High Dose Seasonal PF 09/06/2014  . Influenza,inj,Quad PF,6+ Mos 08/08/2013  . Influenza-Unspecified 08/18/2015, 09/05/2016  . Pneumococcal Conjugate-13 11/15/2015  . Pneumococcal Polysaccharide-23 08/02/1998, 11/15/2012  . Tdap 10/04/2008  . Zoster 08/27/2010  . Zoster Recombinat (Shingrix) 02/09/2017, 04/30/2017   Health Maintenance Due  Topic Date Due  . INFLUENZA VACCINE  06/17/2017  . PNA vac Low Risk Adult (2 of 2 - PPSV23) 11/15/2017    Last Pap smear: one year ago Last mammogram: last year Last colonoscopy: 9 years ago Last DEXA: 4 years ago Dentist: 6 months ago Ophtho: This year Exercise: personal coach, yoga,and with core  Other doctors caring for patient include:--  Advanced directives: Yes.  Copy asked for.     Depression screen:  See questionnaire below.  Depression screen [facility] 2/9 02/10/2018 02/09/2017 11/15/2015 11/15/2015  Decreased Interest 0 0 0 0  Down, Depressed, Hopeless 0 0 0 0  PHQ - 2 Score 0 0 0 0    Fall Risk Screen: see questionnaire below. Fall Risk  02/10/2018 02/10/2018 02/09/2017 11/15/2015 11/15/2015  Falls in the past year? - Yes No No No  Number falls in past yr: 2 or more 2 or more - - -  Injury with Fall? No No - - -  Risk for fall due to : Impaired balance/gait - - - -    ADL screen:  See questionnaire below Functional Status Survey: Is the patient deaf or have difficulty hearing?: No Does the patient have difficulty seeing, even when wearing glasses/contacts?: No Does the patient have difficulty concentrating, remembering, or making decisions?: No Does the patient have difficulty walking or climbing stairs?: Yes Does the patient have difficulty dressing or bathing?: No Does the patient have difficulty doing errands alone such as visiting a doctor's office or shopping?: No   Review of Systems Constitutional: -, -unexpected weight change, -anorexia, -fatigue Allergy: -sneezing, -itching, -congestion Dermatology: denies changing moles, rash, lumps ENT: -runny nose, -ear pain, -sore throat,  Cardiology:  -chest pain, -palpitations, -orthopnea, Respiratory: -cough, -shortness of breath, -dyspnea on exertion, -wheezing,  Gastroenterology: -abdominal pain, -nausea, -vomiting, -diarrhea, -constipation, -dysphagia Hematology: -bleeding or bruising problems Musculoskeletal: -arthralgias, -myalgias, -joint swelling, -back pain, - Ophthalmology: -vision changes,  Urology: -dysuria, -difficulty urinating,  -urinary frequency, -urgency, incontinence Neurology: -, -numbness, , -memory loss, -falls, -dizziness    PHYSICAL EXAM:  General Appearance: Alert, cooperative, no distress, appears stated age Head: Normocephalic, without obvious abnormality, atraumatic Eyes:  PERRL, conjunctiva/corneas clear, EOM's intact, fundi benign Ears: Normal TM's and external ear canals Nose: Nares normal, mucosa normal, no drainage or sinus tenderness Throat: Lips, mucosa, and tongue normal; teeth and gums normal Neck: Supple, no lymphadenopathy;  thyroid:  no enlargement/tenderness/nodules; no carotid bruit or JVD Lungs: Clear to auscultation bilaterally without wheezes, rales or ronchi; respirations unlabored Heart: Regular rate and rhythm, S1 and S2 normal, no murmur, rubor gallop Abdomen: Soft, non-tender, nondistended, normoactive bowel sounds,  no masses, no hepatosplenomegaly Extremities: No clubbing, cyanosis or edema Pulses: 2+ and symmetric all extremities Skin:  Skin color, texture, turgor normal, no rashes or lesions Lymph nodes: Cervical, supraclavicular, and axillary nodes normal Neurologic:  CNII-XII intact, normal strength, sensation and gait; reflexes 2+ and symmetric throughout Psych: Normal mood, affect, hygiene and grooming.  ASSESSMENT/PLAN: Essential hypertension - Plan: losartan (COZAAR) 50 MG tablet, CBC with Differential/Platelet, Comprehensive metabolic panel  Glaucoma, unspecified glaucoma type, unspecified laterality  Hyperlipidemia LDL goal <100 - Plan: Lipid panel  Family history of heart disease in female family member before age 4 - Plan: CBC with Differential/Platelet, Comprehensive metabolic panel, Lipid panel  Family history of breast cancer in mother  Dysthymia  Other seasonal allergic rhinitis  Status post total right knee replacement  Screening for colon cancer - Plan: Cologuard We will not treat the blood pressure.  In the past she has been resistant to doing this.  Instructed her to buy a blood pressure cuff and bring it in with her for her next visit.  Encouraged her to continue with her weight training program.  Continue on her present medications.  May consider stopping the Effexor when she retires which she is  considering doing within the next year or 2.  Medicare Attestation I have personally reviewed: The patient's medical and social history Their use of alcohol, tobacco or illicit drugs Their current medications and supplements The patient's functional ability including ADLs,fall risks, home safety risks, cognitive, and hearing and visual impairment Diet and physical activities Evidence for depression or mood disorders 1 will early give him an findings The patient's weight, height, and BMI have been recorded in the chart.  I have made referrals, counseling, and provided education to the patient based on review of the above and I have provided the patient with a written personalized care plan for preventive services.    Jill Alexanders, MD   02/10/2018

## 2018-02-10 NOTE — Patient Instructions (Signed)
  Ms. Maiolo , Thank you for taking time to come for your Medicare Wellness Visit. I appreciate your ongoing commitment to your health goals. Please review the following plan we discussed and let me know if I can assist you in the future.   These are the goals we discussed: Goals    None      This is a list of the screening recommended for you and due dates:  Health Maintenance  Topic Date Due  . Flu Shot  06/17/2017  . Pneumonia vaccines (2 of 2 - PPSV23) 11/15/2017  . Mammogram  06/11/2018  . Colon Cancer Screening  09/22/2018  . Tetanus Vaccine  10/04/2018  . DEXA scan (bone density measurement)  Completed  .  Hepatitis C: One time screening is recommended by Center for Disease Control  (CDC) for  adults born from [birth year] through 1967.   Completed

## 2018-02-11 LAB — LIPID PANEL
Chol/HDL Ratio: 3.2 ratio (ref 0.0–4.4)
Cholesterol, Total: 189 mg/dL (ref 100–199)
HDL: 59 mg/dL (ref >39)
LDL Calculated: 92 mg/dL (ref 0–99)
TRIGLYCERIDES: 188 mg/dL — AB (ref 0–149)
VLDL CHOLESTEROL CAL: 38 mg/dL (ref 5–40)

## 2018-02-11 LAB — COMPREHENSIVE METABOLIC PANEL
ALT: 19 IU/L (ref 0–32)
AST: 16 IU/L (ref 0–40)
Albumin/Globulin Ratio: 2 (ref 1.2–2.2)
Albumin: 4.3 g/dL (ref 3.6–4.8)
Alkaline Phosphatase: 68 IU/L (ref 39–117)
BUN/Creatinine Ratio: 25 (ref 12–28)
BUN: 15 mg/dL (ref 8–27)
Bilirubin Total: 0.2 mg/dL (ref 0.0–1.2)
CALCIUM: 9.3 mg/dL (ref 8.7–10.3)
CO2: 23 mmol/L (ref 20–29)
Chloride: 104 mmol/L (ref 96–106)
Creatinine, Ser: 0.6 mg/dL (ref 0.57–1.00)
GFR, EST AFRICAN AMERICAN: 108 mL/min/{1.73_m2} (ref >59)
GFR, EST NON AFRICAN AMERICAN: 94 mL/min/{1.73_m2} (ref >59)
GLUCOSE: 110 mg/dL — AB (ref 65–99)
Globulin, Total: 2.2 g/dL (ref 1.5–4.5)
Potassium: 4.8 mmol/L (ref 3.5–5.2)
Sodium: 143 mmol/L (ref 134–144)
TOTAL PROTEIN: 6.5 g/dL (ref 6.0–8.5)

## 2018-02-11 LAB — CBC WITH DIFFERENTIAL/PLATELET
BASOS ABS: 0 10*3/uL (ref 0.0–0.2)
Basos: 1 %
EOS (ABSOLUTE): 0.1 10*3/uL (ref 0.0–0.4)
Eos: 3 %
Hematocrit: 40.4 % (ref 34.0–46.6)
Hemoglobin: 13.4 g/dL (ref 11.1–15.9)
IMMATURE GRANS (ABS): 0 10*3/uL (ref 0.0–0.1)
IMMATURE GRANULOCYTES: 0 %
Lymphocytes Absolute: 1.2 10*3/uL (ref 0.7–3.1)
Lymphs: 31 %
MCH: 30.3 pg (ref 26.6–33.0)
MCHC: 33.2 g/dL (ref 31.5–35.7)
MCV: 91 fL (ref 79–97)
Monocytes Absolute: 0.3 10*3/uL (ref 0.1–0.9)
Monocytes: 9 %
NEUTROS PCT: 56 %
Neutrophils Absolute: 2.2 10*3/uL (ref 1.4–7.0)
Platelets: 267 10*3/uL (ref 150–379)
RBC: 4.42 x10E6/uL (ref 3.77–5.28)
RDW: 14.2 % (ref 12.3–15.4)
WBC: 3.9 10*3/uL (ref 3.4–10.8)

## 2018-02-12 MED ORDER — ATORVASTATIN CALCIUM 10 MG PO TABS: ORAL_TABLET | ORAL | 3 refills | Status: DC

## 2018-02-12 NOTE — Addendum Note (Signed)
Addended by: Denita Lung on: 02/12/2018 10:14 AM   Modules accepted: Orders

## 2018-02-15 ENCOUNTER — Telehealth: Payer: Self-pay | Admitting: Family Medicine

## 2018-02-15 NOTE — Telephone Encounter (Signed)
New Message  Pt verbalized she was suppose to be put on new bp medication and she does not know the name of the medication and pt verbalized it was not there.  Please f/u with pt

## 2018-02-15 NOTE — Telephone Encounter (Signed)
Pt was called back about her b/p med. No answer lvm for pt to call back. [place] 02-15-18

## 2018-02-16 ENCOUNTER — Other Ambulatory Visit: Payer: Self-pay

## 2018-02-16 DIAGNOSIS — I10 Essential (primary) hypertension: Secondary | ICD-10-CM

## 2018-02-16 MED ORDER — LOSARTAN POTASSIUM 50 MG PO TABS: 50.0000 mg | ORAL_TABLET | Freq: Every day | ORAL | 3 refills | Status: DC

## 2018-02-16 NOTE — Telephone Encounter (Signed)
Spoke to pt and sent her laosratan to [facility] as requested . [place]

## 2018-02-18 ENCOUNTER — Telehealth: Payer: Self-pay

## 2018-02-18 NOTE — Telephone Encounter (Signed)
Pt was called to ask about her Losartan that was called in. Pt says she has gotten it now. Thanks [employer]

## 2018-02-19 DIAGNOSIS — H02423 Myogenic ptosis of bilateral eyelids: Secondary | ICD-10-CM | POA: Diagnosis not present

## 2018-03-05 LAB — HGB A1C W/O EAG: Hgb A1c MFr Bld: 5.7 % — ABNORMAL HIGH (ref 4.8–5.6)

## 2018-03-05 LAB — SPECIMEN STATUS REPORT

## 2018-03-17 ENCOUNTER — Ambulatory Visit (INDEPENDENT_AMBULATORY_CARE_PROVIDER_SITE_OTHER): Payer: Medicare Other | Admitting: Podiatry

## 2018-03-17 ENCOUNTER — Ambulatory Visit (INDEPENDENT_AMBULATORY_CARE_PROVIDER_SITE_OTHER): Payer: Medicare Other

## 2018-03-17 ENCOUNTER — Encounter: Payer: Self-pay | Admitting: Podiatry

## 2018-03-17 ENCOUNTER — Other Ambulatory Visit: Payer: Self-pay | Admitting: Podiatry

## 2018-03-17 ENCOUNTER — Encounter: Payer: Self-pay | Admitting: *Deleted

## 2018-03-17 VITALS — BP 151/84 | HR 88

## 2018-03-17 DIAGNOSIS — M778 Other enthesopathies, not elsewhere classified: Secondary | ICD-10-CM

## 2018-03-17 DIAGNOSIS — L720 Epidermal cyst: Secondary | ICD-10-CM | POA: Insufficient documentation

## 2018-03-17 DIAGNOSIS — Z780 Asymptomatic menopausal state: Secondary | ICD-10-CM | POA: Insufficient documentation

## 2018-03-17 DIAGNOSIS — M2041 Other hammer toe(s) (acquired), right foot: Secondary | ICD-10-CM | POA: Diagnosis not present

## 2018-03-17 DIAGNOSIS — M779 Enthesopathy, unspecified: Secondary | ICD-10-CM | POA: Diagnosis not present

## 2018-03-17 NOTE — Progress Notes (Signed)
Subjective:   Patient ID: Jodi Duffy, female   DOB: 67 y.o.   MRN: [id number]   HPI Patient presents stating the fourth toe of the right foot has really rotated and it is been bothering me more recently.  Patient had surgery on this a number of years ago and does not smoke and likes to be active   Review of Systems  All other systems reviewed and are negative.       Objective:  Physical Exam  Constitutional: She appears well-developed and well-nourished.  Cardiovascular: Intact distal pulses.  Pulmonary/Chest: Effort normal.  Musculoskeletal: Normal range of motion.  Neurological: She is alert.  Skin: Skin is warm.  Nursing note and vitals reviewed.   Neurovascular status intact muscle strength adequate range of motion within normal limits with patient found to have a severe distal rotation digit 4 right with previous surgery that was done approximately 20 years ago they did well with gradual change in the structure of the digit     Assessment:  Digital deformity secondary to pathological position of the fourth toe with generalized condition secondary to her digital structure     Plan:  H&P x-ray right foot reviewed and at this time I do think I could take out a small amount of proximal phalanx utilized pin and hopefully hold the position that is workable for her.  I did go ahead today and I discussed there is no guarantee as to how this will be over the possibility exists that she will have more trouble with this toe due to lack of bone content.  Patient wants surgery understanding the risk and at this time after extensive review signed consent form is given all preoperative instructions and is encouraged to call with questions prior to surgery  X-ray indicates there is been removal of most of the middle phalanx right with significant rotation fourth digit right

## 2018-03-17 NOTE — Patient Instructions (Signed)
Pre-Operative Instructions  Congratulations, you have decided to take an important step towards improving your quality of life.  You can be assured that the doctors and staff at [facility] will be with you every step of the way.  Here are some important things you should know:  1. Plan to be at the surgery center/hospital at least 1 (one) hour prior to your scheduled time, unless otherwise directed by the surgical center/hospital staff.  You must have a responsible adult accompany you, remain during the surgery and drive you home.  Make sure you have directions to the surgical center/hospital to ensure you arrive on time. 2. If you are having surgery at [facility] or [facility], you will need a copy of your medical history and physical form from your family physician within one month prior to the date of surgery. We will give you a form for your primary physician to complete.  3. We make every effort to accommodate the date you request for surgery.  However, there are times where surgery dates or times have to be moved.  We will contact you as soon as possible if a change in schedule is required.   4. No aspirin/ibuprofen for one week before surgery.  If you are on aspirin, any non-steroidal anti-inflammatory medications (Mobic, Aleve, Ibuprofen) should not be taken seven (7) days prior to your surgery.  You make take Tylenol for pain prior to surgery.  5. Medications - If you are taking daily heart and blood pressure medications, seizure, reflux, allergy, asthma, anxiety, pain or diabetes medications, make sure you notify the surgery center/hospital before the day of surgery so they can tell you which medications you should take or avoid the day of surgery. 6. No food or drink after midnight the night before surgery unless directed otherwise by surgical center/hospital staff. 7. No alcoholic beverages 24-hours prior to surgery.  No smoking 24-hours prior or 24-hours after  surgery. 8. Wear loose pants or shorts. They should be loose enough to fit over bandages, boots, and casts. 9. Don't wear slip-on shoes. Sneakers are preferred. 10. Bring your boot with you to the surgery center/hospital.  Also bring crutches or a walker if your physician has prescribed it for you.  If you do not have this equipment, it will be provided for you after surgery. 11. If you have not been contacted by the surgery center/hospital by the day before your surgery, call to confirm the date and time of your surgery. 12. Leave-time from work may vary depending on the type of surgery you have.  Appropriate arrangements should be made prior to surgery with your employer. 13. Prescriptions will be provided immediately following surgery by your doctor.  Fill these as soon as possible after surgery and take the medication as directed. Pain medications will not be refilled on weekends and must be approved by the doctor. 14. Remove nail polish on the operative foot and avoid getting pedicures prior to surgery. 15. Wash the night before surgery.  The night before surgery wash the foot and leg well with water and the antibacterial soap provided. Be sure to pay special attention to beneath the toenails and in between the toes.  Wash for at least three (3) minutes. Rinse thoroughly with water and dry well with a towel.  Perform this wash unless told not to do so by your physician.  Enclosed: 1 Ice pack (please put in freezer the night before surgery)   1 Hibiclens skin cleaner     Pre-op instructions  If you have any questions regarding the instructions, please do not hesitate to call our office.  [place]: [address], , [place] [address] -- [phone]  [place]: [address] [place], [place] [address] -- [phone]  [place]: [address]  [place], [place] [address] -- [phone]  [place]: [address], [address], [place], [place] [address] -- [phone]  Website:  [email or web address] 

## 2018-03-21 DIAGNOSIS — Z1211 Encounter for screening for malignant neoplasm of colon: Secondary | ICD-10-CM | POA: Diagnosis not present

## 2018-03-22 ENCOUNTER — Ambulatory Visit: Payer: Medicare Other | Admitting: Podiatry

## 2018-03-23 ENCOUNTER — Encounter: Payer: Self-pay | Admitting: Podiatry

## 2018-03-23 DIAGNOSIS — E7800 Pure hypercholesterolemia, unspecified: Secondary | ICD-10-CM | POA: Diagnosis not present

## 2018-03-23 DIAGNOSIS — M2041 Other hammer toe(s) (acquired), right foot: Secondary | ICD-10-CM | POA: Diagnosis not present

## 2018-03-24 LAB — COLOGUARD: COLOGUARD: NEGATIVE

## 2018-03-31 ENCOUNTER — Ambulatory Visit (INDEPENDENT_AMBULATORY_CARE_PROVIDER_SITE_OTHER): Payer: Self-pay | Admitting: Podiatry

## 2018-03-31 ENCOUNTER — Ambulatory Visit (INDEPENDENT_AMBULATORY_CARE_PROVIDER_SITE_OTHER): Payer: Medicare Other

## 2018-03-31 ENCOUNTER — Encounter: Payer: Self-pay | Admitting: Podiatry

## 2018-03-31 DIAGNOSIS — M778 Other enthesopathies, not elsewhere classified: Secondary | ICD-10-CM

## 2018-03-31 DIAGNOSIS — M779 Enthesopathy, unspecified: Secondary | ICD-10-CM

## 2018-03-31 DIAGNOSIS — M2041 Other hammer toe(s) (acquired), right foot: Secondary | ICD-10-CM | POA: Diagnosis not present

## 2018-04-04 NOTE — Progress Notes (Signed)
Subjective:   Patient ID: Jodi Duffy, female   DOB: 67 y.o.   MRN: [id number]   HPI Patient states having minimal pain in the back into normal activity   ROS      Objective:  Physical Exam  Neurovascular status intact digit 4 right is healing well with good alignment noted and stitches intact     Assessment:  Doing well post arthroplasty right     Plan:  Advised on continued compression and dressing and continued open toed shoes and reappoint to recheck  X-ray indicates that the digits in good alignment with satisfactory resection of bone

## 2018-04-05 ENCOUNTER — Telehealth: Payer: Self-pay

## 2018-04-05 NOTE — Telephone Encounter (Signed)
Pt was made aware of colloguard results [facility]

## 2018-04-05 NOTE — Progress Notes (Signed)
ERROR

## 2018-04-14 ENCOUNTER — Encounter: Payer: Self-pay | Admitting: Podiatry

## 2018-04-14 ENCOUNTER — Ambulatory Visit (INDEPENDENT_AMBULATORY_CARE_PROVIDER_SITE_OTHER): Payer: Medicare Other

## 2018-04-14 ENCOUNTER — Ambulatory Visit (INDEPENDENT_AMBULATORY_CARE_PROVIDER_SITE_OTHER): Payer: Medicare Other | Admitting: Podiatry

## 2018-04-14 DIAGNOSIS — M778 Other enthesopathies, not elsewhere classified: Secondary | ICD-10-CM

## 2018-04-14 DIAGNOSIS — M779 Enthesopathy, unspecified: Secondary | ICD-10-CM

## 2018-04-14 DIAGNOSIS — M2041 Other hammer toe(s) (acquired), right foot: Secondary | ICD-10-CM

## 2018-04-26 ENCOUNTER — Encounter: Payer: Self-pay | Admitting: Family Medicine

## 2018-04-26 ENCOUNTER — Other Ambulatory Visit: Payer: Self-pay | Admitting: Family Medicine

## 2018-04-26 ENCOUNTER — Ambulatory Visit (INDEPENDENT_AMBULATORY_CARE_PROVIDER_SITE_OTHER): Payer: Medicare Other | Admitting: Family Medicine

## 2018-04-26 VITALS — BP 178/90 | HR 82 | Temp 98.0°F | Wt 147.8 lb

## 2018-04-26 DIAGNOSIS — T07XXXA Unspecified multiple injuries, initial encounter: Secondary | ICD-10-CM

## 2018-04-26 DIAGNOSIS — F341 Dysthymic disorder: Secondary | ICD-10-CM

## 2018-04-26 DIAGNOSIS — R251 Tremor, unspecified: Secondary | ICD-10-CM

## 2018-04-26 DIAGNOSIS — I10 Essential (primary) hypertension: Secondary | ICD-10-CM

## 2018-04-26 MED ORDER — OLMESARTAN MEDOXOMIL-HCTZ 40-12.5 MG PO TABS: 1.0000 | ORAL_TABLET | Freq: Every day | ORAL | 3 refills | Status: DC

## 2018-04-26 MED ORDER — VENLAFAXINE HCL ER 150 MG PO CP24: 150.0000 mg | ORAL_CAPSULE | Freq: Every day | ORAL | 1 refills | Status: DC

## 2018-04-26 NOTE — Telephone Encounter (Signed)
[facility] is requesting to fill pt effexor. Please advise [facility]
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
## 2018-05-05 ENCOUNTER — Other Ambulatory Visit: Payer: Self-pay | Admitting: Family Medicine

## 2018-05-05 DIAGNOSIS — E785 Hyperlipidemia, unspecified: Secondary | ICD-10-CM

## 2018-05-05 DIAGNOSIS — Z8249 Family history of ischemic heart disease and other diseases of the circulatory system: Secondary | ICD-10-CM

## 2018-05-10 ENCOUNTER — Ambulatory Visit: Payer: BLUE CROSS/BLUE SHIELD | Admitting: Neurology

## 2018-05-11 DIAGNOSIS — M1990 Unspecified osteoarthritis, unspecified site: Secondary | ICD-10-CM | POA: Diagnosis not present

## 2018-05-11 DIAGNOSIS — H02112 Cicatricial ectropion of right lower eyelid: Secondary | ICD-10-CM | POA: Diagnosis not present

## 2018-05-11 DIAGNOSIS — H04222 Epiphora due to insufficient drainage, left lacrimal gland: Secondary | ICD-10-CM | POA: Diagnosis not present

## 2018-05-11 DIAGNOSIS — H02113 Cicatricial ectropion of right eye, unspecified eyelid: Secondary | ICD-10-CM | POA: Diagnosis not present

## 2018-05-11 DIAGNOSIS — H02116 Cicatricial ectropion of left eye, unspecified eyelid: Secondary | ICD-10-CM | POA: Diagnosis not present

## 2018-05-11 DIAGNOSIS — Z880 Allergy status to penicillin: Secondary | ICD-10-CM | POA: Diagnosis not present

## 2018-05-11 DIAGNOSIS — F419 Anxiety disorder, unspecified: Secondary | ICD-10-CM | POA: Diagnosis not present

## 2018-05-11 DIAGNOSIS — H04221 Epiphora due to insufficient drainage, right lacrimal gland: Secondary | ICD-10-CM | POA: Diagnosis not present

## 2018-05-11 DIAGNOSIS — H02834 Dermatochalasis of left upper eyelid: Secondary | ICD-10-CM | POA: Diagnosis not present

## 2018-05-11 DIAGNOSIS — F329 Major depressive disorder, single episode, unspecified: Secondary | ICD-10-CM | POA: Diagnosis not present

## 2018-05-11 DIAGNOSIS — H02115 Cicatricial ectropion of left lower eyelid: Secondary | ICD-10-CM | POA: Diagnosis not present

## 2018-05-11 DIAGNOSIS — H02132 Senile ectropion of right lower eyelid: Secondary | ICD-10-CM | POA: Diagnosis not present

## 2018-05-11 DIAGNOSIS — H02831 Dermatochalasis of right upper eyelid: Secondary | ICD-10-CM | POA: Diagnosis not present

## 2018-05-11 DIAGNOSIS — I10 Essential (primary) hypertension: Secondary | ICD-10-CM | POA: Diagnosis not present

## 2018-05-11 DIAGNOSIS — Z87891 Personal history of nicotine dependence: Secondary | ICD-10-CM | POA: Diagnosis not present

## 2018-05-11 DIAGNOSIS — J302 Other seasonal allergic rhinitis: Secondary | ICD-10-CM | POA: Diagnosis not present

## 2018-05-11 DIAGNOSIS — H409 Unspecified glaucoma: Secondary | ICD-10-CM | POA: Diagnosis not present

## 2018-05-11 DIAGNOSIS — E65 Localized adiposity: Secondary | ICD-10-CM | POA: Diagnosis not present

## 2018-05-11 DIAGNOSIS — H02135 Senile ectropion of left lower eyelid: Secondary | ICD-10-CM | POA: Diagnosis not present

## 2018-05-11 DIAGNOSIS — M8580 Other specified disorders of bone density and structure, unspecified site: Secondary | ICD-10-CM | POA: Diagnosis not present

## 2018-05-11 DIAGNOSIS — H04203 Unspecified epiphora, bilateral lacrimal glands: Secondary | ICD-10-CM | POA: Diagnosis not present

## 2018-05-11 DIAGNOSIS — H02423 Myogenic ptosis of bilateral eyelids: Secondary | ICD-10-CM | POA: Diagnosis not present

## 2018-05-11 NOTE — Progress Notes (Signed)
Jodi Duffy was seen today in the movement disorders clinic for neurologic consultation at the request of Denita Lung, MD.  The consultation is for the evaluation of R hand tremor.  The records that were made available to me were reviewed.  Tremor: Yes.     How long has it been going on? 3 months  At rest or with activation?  Eating, drinking - when something is in the hand - when cup is antigravity - "It annoys my family more than me."  Fam hx of tremor?  Yes.   - my brother - unknown dx  Affected by caffeine:  No. (2 cups coffee and 2 glasses iced tea)  Affected by alcohol:  No. (1 glass wine/night)  Affected by stress:  No.  Affected by fatigue:  No.  Spills soup if on spoon:  No.  Spills glass of liquid if full:  No.  Affects ADL's (tying shoes, brushing teeth, etc):  No.  Tremor inducing meds:  No.  Other Specific Symptoms:  Voice: no change Sleep: sleeps well except nocturia x 1  Vivid Dreams:  No.  Acting out dreams:  No. Wet Pillows: No. Postural symptoms:  Yes.    Falls?  No. but has had near falls; attributed near falls to "old age." Bradykinesia symptoms: shuffling gait and difficulty getting out of a chair Loss of smell:  No. Loss of taste:  No. Urinary Incontinence:  No. Difficulty Swallowing:  No. Handwriting, micrographia: No. Depression:  No. Memory changes:  Yes.   (recalling names, minor issues) N/V:  No. Lightheaded:  No.  Syncope: No. Diplopia:  No. Dyskinesia:  No.  Neuroimaging of the brain has  previously been performed. Most recent neuroimaging of the brain was a CT of the brain in 2012.  This was reported to show mild to moderate small vessel disease.  PREVIOUS MEDICATIONS: none to date  ALLERGIES:  No Known Allergies  CURRENT MEDICATIONS:  Outpatient Encounter Medications as of 05/13/2018  Medication Sig  . atorvastatin (LIPITOR) 10 MG tablet TAKE 1 TABLET(10 MG) BY MOUTH DAILY  . cetirizine-pseudoephedrine (ZYRTEC-D) 5-120 MG per  tablet Take 1 tablet by mouth daily as needed. Reported on 01/04/2016  . cholecalciferol (VITAMIN D) 1000 units tablet Take 1,000 Units by mouth daily.  . COMBIGAN 0.2-0.5 % ophthalmic solution PLACE 1 DROP INTO BOTH EYES BID  . desonide (DESOWEN) 0.05 % cream desonide 0.05 % topical cream  . latanoprost (XALATAN) 0.005 % ophthalmic solution Place 1 drop into both eyes at bedtime.  Marland Kitchen LUMIGAN 0.01 % SOLN INSTILL 1 DROP INTO BOTH EYES QHS  . Multiple Vitamins-Minerals (MULTIVITAMIN WITH MINERALS) tablet Take 1 tablet by mouth daily.  Marland Kitchen neomycin-polymyxin b-dexamethasone (MAXITROL) 3.5-10000-0.1 SUSP Use 1 drop 4x a day in the right eye for 2 weeks then 1 drop 2x a day for 2 weeks then stop.  Marland Kitchen neomycin-polymyxin b-dexamethasone (MAXITROL) 3.5-10000-0.1 SUSP   . olmesartan-hydrochlorothiazide (BENICAR HCT) 40-12.5 MG tablet Take 1 tablet by mouth daily.  Marland Kitchen venlafaxine XR (EFFEXOR-XR) 150 MG 24 hr capsule TAKE 1 CAPSULE(150 MG) BY MOUTH DAILY WITH BREAKFAST  . vitamin B-12 (CYANOCOBALAMIN) 1000 MCG tablet Take 1,000 mcg by mouth daily.  . vitamin C (ASCORBIC ACID) 500 MG tablet Take 500 mg by mouth daily.  . [DISCONTINUED] atorvastatin (LIPITOR) 10 MG tablet One per day  . [DISCONTINUED] diazepam (VALIUM) 5 MG tablet TK 1 T PO UTD  . [DISCONTINUED] Influenza Vac A&B Surf Ant Adj (FLUAD) 0.5 ML SUSY Fluad  2018-19 75yr up(PF)45 mcg(15 mcgx3)/0.5 mL intramuscular syringe  ADM 0.5ML IM UTD  . [DISCONTINUED] Influenza vac split quadrivalent PF (FLUZONE HIGH-DOSE) 0.5 ML injection Fluzone High-Dose 2017-2018 (PF) 180 mcg/0.5 mL intramuscular syringe  TO BE ADMINISTERED BY PHARMACIST FOR IMMUNIZATION  . [DISCONTINUED] predniSONE (STERAPRED UNI-PAK 21 TAB) 10 MG (21) TBPK tablet TK UTD   No facility-administered encounter medications on file as of 05/13/2018.     PAST MEDICAL HISTORY:   Past Medical History:  Diagnosis Date  . Allergy    RHINITIS  . Arthritis   . Depression   . Glaucoma   .  Hyperlipidemia   . Hypertension   . Osteopenia   . TMJ syndrome     PAST SURGICAL HISTORY:   Past Surgical History:  Procedure Laterality Date  . BIOPSY BREAST    . BLEPHAROPLASTY    . KNEE ARTHROSCOPY     right knee, 1990 and 2012, Dr. Durward Fortes  . TEMPOROMANDIBULAR JOINT SURGERY    . TONSILLECTOMY    . TOTAL KNEE ARTHROPLASTY  08/10/2012   Procedure: TOTAL KNEE ARTHROPLASTY;  Surgeon: Garald Balding, MD;  Location: [facility];  Service: Orthopedics;  Laterality: Right;  Right Total Knee Arthroplasty    SOCIAL HISTORY:   Social History   Socioeconomic History  . Marital status: Married    Spouse name: Not on file  . Number of children: Not on file  . Years of education: Not on file  . Highest education level: Professional school degree (e.g., MD, DDS, DVM, JD)  Occupational History  . Occupation: attorney    Employer: [employer], [employer]  Social Needs  . Financial resource strain: Not on file  . Food insecurity:    Worry: Not on file    Inability: Not on file  . Transportation needs:    Medical: Not on file    Non-medical: Not on file  Tobacco Use  . Smoking status: Former Smoker    Last attempt to quit: 02/10/1980    Years since quitting: 38.2  . Smokeless tobacco: Never Used  Substance and Sexual Activity  . Alcohol use: Yes    Alcohol/week: 0.6 oz    Types: 1 Glasses of wine per week  . Drug use: No  . Sexual activity: Yes  Lifestyle  . Physical activity:    Days per week: Not on file    Minutes per session: Not on file  . Stress: Not on file  Relationships  . Social connections:    Talks on phone: Not on file    Gets together: Not on file    Attends religious service: Not on file    Active member of club or organization: Not on file    Attends meetings of clubs or organizations: Not on file    Relationship status: Not on file  . Intimate partner violence:    Fear of current or ex partner: Not on file    Emotionally abused: Not on file     Physically abused: Not on file    Forced sexual activity: Not on file  Other Topics Concern  . Not on file  Social History Narrative   Exercises with pilates, golf, and has a trainer    FAMILY HISTORY:   Family Status  Relation Name Status  . Mother  Deceased       03/09/1987  . Father  Deceased       Jul 11, 1975  . Brother  Alive  . Mat Aunt  (Not Specified)  .  Child 2 Alive  . Neg Hx  (Not Specified)    ROS:  ROS  PHYSICAL EXAMINATION:    VITALS:   Vitals:   05/13/18 0913  BP: 110/70  Pulse: 72  SpO2: 98%  Weight: 147 lb (66.7 kg)  Height: 5\' 3"  (1.6 m)    GEN:  The patient appears stated age and is in NAD. HEENT:  Normocephalic.  Significant ecchymosis around both eyes as just had blepharoplasty 2 days ago.  There is conjunctival erythema on the left.  The mucous membranes are moist. The superficial temporal arteries are without ropiness or tenderness. CV:  RRR Lungs:  CTAB Neck/HEME:  There are no carotid bruits bilaterally.  Neurological examination:  Orientation: The patient is alert and oriented x3. Fund of knowledge is appropriate.  Recent and remote memory are intact.  Attention and concentration are normal.    Able to name objects and repeat phrases. Cranial nerves: There is good facial symmetry. Pupils are equal round and reactive to light bilaterally. Fundoscopic exam is attempted but the disc margins are not well visualized bilaterally. Extraocular muscles are intact. The visual fields are full to confrontational testing. The speech is fluent and clear but pseudobulbar in quality. Soft palate rises symmetrically and there is no tongue deviation. Hearing is intact to conversational tone. Sensation: Sensation is intact to light and pinprick throughout (facial, trunk, extremities). Vibration is intact at the bilateral big toe. There is no extinction with double simultaneous stimulation. There is no sensory dermatomal level identified. Motor: Strength is 5/5 in  the bilateral upper and lower extremities.   Shoulder shrug is equal and symmetric.  There is no pronator drift. Deep tendon reflexes: Deep tendon reflexes are 2/4 at the bilateral biceps, triceps, brachioradialis, patella and achilles. Plantar responses are downgoing bilaterally.  Movement examination: Tone: There is normal tone in the bilateral upper extremities.  The tone in the lower extremities is normal.  Abnormal movements: There is no rest tremor, even with distraction procedures.  There is no postural tremor.  There is no intention tremor.  There is tremor in the right hand when she is given a weight to hold.  There is mild tremor when she is asked to pour water from one glass to another, when the full glass of water is in the right hand. Coordination:  There is no decremation with RAM's, with any form of RAMS, including alternating supination and pronation of the forearm, hand opening and closing, finger taps, heel taps and toe taps. Gait and Station: The patient has no difficulty arising out of a deep-seated chair without the use of the hands. The patient's stride length is slightly decreased with decreased arm swing bilaterally.  She is tilted slightly to the right but she is stable.    Labs:   Chemistry      Component Value Date/Time   NA 143 02/10/2018 1053   K 4.8 02/10/2018 1053   CL 104 02/10/2018 1053   CO2 23 02/10/2018 1053   BUN 15 02/10/2018 1053   CREATININE 0.60 02/10/2018 1053   CREATININE 0.58 02/09/2017 0847      Component Value Date/Time   CALCIUM 9.3 02/10/2018 1053   ALKPHOS 68 02/10/2018 1053   AST 16 02/10/2018 1053   ALT 19 02/10/2018 1053   BILITOT 0.2 02/10/2018 1053     Lab Results  Component Value Date   TSH 1.948 10/16/2015   No results found for: VITAMINB12   ASSESSMENT/PLAN:  1.  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  Her neurologic examination is nonfocal and nonlateralizing, with the exception of the asymmetric tremor when she holds a weight.  This does not bother her, but is bothersome to her family somewhat.  If she gets worse with any new or worsening neurologic symptoms over the next year, she is to call me and make a sooner follow-up.  If she begins to have falls, she is to call me.  She expressed understanding.  -will do TSH  2.  We will plan on seeing her back in 1 year, sooner should issues arise.  Cc:  Denita Lung, MD

## 2018-05-13 ENCOUNTER — Other Ambulatory Visit (INDEPENDENT_AMBULATORY_CARE_PROVIDER_SITE_OTHER): Payer: Medicare Other

## 2018-05-13 ENCOUNTER — Ambulatory Visit (INDEPENDENT_AMBULATORY_CARE_PROVIDER_SITE_OTHER): Payer: Medicare Other | Admitting: Neurology

## 2018-05-13 ENCOUNTER — Telehealth: Payer: Self-pay | Admitting: *Deleted

## 2018-05-13 ENCOUNTER — Encounter: Payer: Self-pay | Admitting: Neurology

## 2018-05-13 ENCOUNTER — Encounter: Payer: Self-pay | Admitting: *Deleted

## 2018-05-13 VITALS — BP 110/70 | HR 72 | Ht 63.0 in | Wt 147.0 lb

## 2018-05-13 DIAGNOSIS — R251 Tremor, unspecified: Secondary | ICD-10-CM

## 2018-05-13 DIAGNOSIS — R269 Unspecified abnormalities of gait and mobility: Secondary | ICD-10-CM | POA: Diagnosis not present

## 2018-05-13 DIAGNOSIS — R490 Dysphonia: Secondary | ICD-10-CM | POA: Diagnosis not present

## 2018-05-13 DIAGNOSIS — J383 Other diseases of vocal cords: Secondary | ICD-10-CM

## 2018-05-13 LAB — TSH: TSH: 2.08 u[IU]/mL (ref 0.35–4.50)

## 2018-05-13 NOTE — Telephone Encounter (Signed)
Results sent via My Chart.  

## 2018-05-13 NOTE — Telephone Encounter (Signed)
-----   Message from [place], DO sent at 05/13/2018  3:03 PM EDT ----- Let pt know that tsh is normal

## 2018-06-07 ENCOUNTER — Encounter: Payer: Self-pay | Admitting: Family Medicine

## 2018-06-07 ENCOUNTER — Ambulatory Visit (INDEPENDENT_AMBULATORY_CARE_PROVIDER_SITE_OTHER): Payer: Medicare Other | Admitting: Family Medicine

## 2018-06-07 VITALS — BP 138/76 | HR 79 | Temp 98.1°F | Ht 63.0 in | Wt 149.2 lb

## 2018-06-07 DIAGNOSIS — I10 Essential (primary) hypertension: Secondary | ICD-10-CM

## 2018-06-07 DIAGNOSIS — N3000 Acute cystitis without hematuria: Secondary | ICD-10-CM | POA: Diagnosis not present

## 2018-06-07 DIAGNOSIS — R8290 Unspecified abnormal findings in urine: Secondary | ICD-10-CM

## 2018-06-07 LAB — POCT URINALYSIS DIP (PROADVANTAGE DEVICE)
Bilirubin, UA: NEGATIVE
Blood, UA: NEGATIVE
Glucose, UA: NEGATIVE mg/dL
Ketones, POC UA: NEGATIVE mg/dL
Nitrite, UA: POSITIVE — AB
PH UA: 7 (ref 5.0–8.0)
Protein Ur, POC: NEGATIVE mg/dL
SPECIFIC GRAVITY, URINE: 1.01
UUROB: 3.5

## 2018-06-07 MED ORDER — SULFAMETHOXAZOLE-TRIMETHOPRIM 800-160 MG PO TABS
1.0000 | ORAL_TABLET | Freq: Two times a day (BID) | ORAL | 0 refills | Status: DC
Start: 2018-06-07 — End: 2019-05-16

## 2018-06-07 NOTE — Addendum Note (Signed)
Addended by: Denita Lung on: 06/07/2018 04:36 PM   Modules accepted: Orders, Level of Service

## 2018-06-07 NOTE — Addendum Note (Signed)
Addended by: Denita Lung on: 06/07/2018 02:31 PM   Modules accepted: Orders

## 2018-06-07 NOTE — Progress Notes (Addendum)
   Subjective:    Patient ID: Jodi Duffy, female    DOB: Feb 16, 1951, 67 y.o.   MRN: [id number]  HPI She is here for a recheck.  She is taking olmesartan/HCTZ and doing well on that.  She has no particular concerns or complaints other than abnormal urine smell.  Review of Systems     Objective:   Physical Exam Alert and in no distress.  Blood pressure is recorded       Assessment & Plan:  Essential hypertension  Abnormal urine - Plan: POCT Urinalysis DIP (Proadvantage Device), CANCELED: Urinalysis Dipstick, CANCELED: POCT Urinalysis DIP (Proadvantage Device)  Acute cystitis without hematuria - Plan: sulfamethoxazole-trimethoprim (BACTRIM DS,SEPTRA DS) 800-160 MG tablet Continue on present medication regimen.

## 2018-06-07 NOTE — Addendum Note (Signed)
Addended by: Elyse Jarvis on: 06/07/2018 03:52 PM   Modules accepted: Orders

## 2018-06-08 DIAGNOSIS — Z1231 Encounter for screening mammogram for malignant neoplasm of breast: Secondary | ICD-10-CM | POA: Diagnosis not present

## 2018-06-08 DIAGNOSIS — Z803 Family history of malignant neoplasm of breast: Secondary | ICD-10-CM | POA: Diagnosis not present

## 2018-06-21 ENCOUNTER — Telehealth: Payer: Self-pay

## 2018-06-21 ENCOUNTER — Other Ambulatory Visit (INDEPENDENT_AMBULATORY_CARE_PROVIDER_SITE_OTHER): Payer: Medicare Other

## 2018-06-21 DIAGNOSIS — Z23 Encounter for immunization: Secondary | ICD-10-CM | POA: Diagnosis not present

## 2018-06-21 NOTE — Telephone Encounter (Signed)
lmom asking patient to call back and inform us when she can come in for her Tetanus shot.

## 2018-06-21 NOTE — Telephone Encounter (Signed)
ok 

## 2018-06-21 NOTE — Telephone Encounter (Signed)
Pt wants to know if she can come in for a tetanus shot. She slammed a sliding door on her thumb in which was a rusty area. Is it ok for her to have tetanus? Last one was given in 09/2008.     Patient can be reached at [phone]

## 2018-06-21 NOTE — Telephone Encounter (Signed)
Pt had her Tetanus shot done today.

## 2018-06-28 DIAGNOSIS — H40033 Anatomical narrow angle, bilateral: Secondary | ICD-10-CM | POA: Diagnosis not present

## 2018-06-28 DIAGNOSIS — G510 Bell's palsy: Secondary | ICD-10-CM | POA: Diagnosis not present

## 2018-06-28 DIAGNOSIS — H04123 Dry eye syndrome of bilateral lacrimal glands: Secondary | ICD-10-CM | POA: Diagnosis not present

## 2018-06-28 DIAGNOSIS — H04221 Epiphora due to insufficient drainage, right lacrimal gland: Secondary | ICD-10-CM | POA: Diagnosis not present

## 2018-06-28 DIAGNOSIS — H40053 Ocular hypertension, bilateral: Secondary | ICD-10-CM | POA: Diagnosis not present

## 2018-07-06 DIAGNOSIS — Z01419 Encounter for gynecological examination (general) (routine) without abnormal findings: Secondary | ICD-10-CM | POA: Diagnosis not present

## 2018-07-07 DIAGNOSIS — S62525B Nondisplaced fracture of distal phalanx of left thumb, initial encounter for open fracture: Secondary | ICD-10-CM | POA: Diagnosis not present

## 2018-07-07 DIAGNOSIS — S6702XA Crushing injury of left thumb, initial encounter: Secondary | ICD-10-CM | POA: Diagnosis not present

## 2018-07-30 DIAGNOSIS — S6702XD Crushing injury of left thumb, subsequent encounter: Secondary | ICD-10-CM | POA: Diagnosis not present

## 2018-07-30 DIAGNOSIS — S62525D Nondisplaced fracture of distal phalanx of left thumb, subsequent encounter for fracture with routine healing: Secondary | ICD-10-CM | POA: Diagnosis not present

## 2018-08-03 ENCOUNTER — Other Ambulatory Visit: Payer: Self-pay | Admitting: Family Medicine

## 2018-08-03 DIAGNOSIS — E785 Hyperlipidemia, unspecified: Secondary | ICD-10-CM

## 2018-08-03 DIAGNOSIS — Z8249 Family history of ischemic heart disease and other diseases of the circulatory system: Secondary | ICD-10-CM

## 2018-08-03 DIAGNOSIS — F341 Dysthymic disorder: Secondary | ICD-10-CM

## 2018-08-03 NOTE — Telephone Encounter (Signed)
[facility] is requesting to fill pt effexor. Please advise [facility]

## 2018-08-07 DIAGNOSIS — Z23 Encounter for immunization: Secondary | ICD-10-CM | POA: Diagnosis not present

## 2018-09-01 DIAGNOSIS — H02423 Myogenic ptosis of bilateral eyelids: Secondary | ICD-10-CM | POA: Diagnosis not present

## 2018-09-13 DIAGNOSIS — S62525D Nondisplaced fracture of distal phalanx of left thumb, subsequent encounter for fracture with routine healing: Secondary | ICD-10-CM | POA: Diagnosis not present

## 2018-09-29 DIAGNOSIS — H3581 Retinal edema: Secondary | ICD-10-CM | POA: Insufficient documentation

## 2018-10-18 ENCOUNTER — Ambulatory Visit (INDEPENDENT_AMBULATORY_CARE_PROVIDER_SITE_OTHER): Payer: Medicare Other

## 2018-10-18 ENCOUNTER — Ambulatory Visit (INDEPENDENT_AMBULATORY_CARE_PROVIDER_SITE_OTHER): Payer: Medicare Other | Admitting: Orthopaedic Surgery

## 2018-10-18 ENCOUNTER — Encounter (INDEPENDENT_AMBULATORY_CARE_PROVIDER_SITE_OTHER): Payer: Self-pay | Admitting: Orthopaedic Surgery

## 2018-10-18 VITALS — BP 145/92 | HR 75 | Ht 63.0 in | Wt 145.0 lb

## 2018-10-18 DIAGNOSIS — M25521 Pain in right elbow: Secondary | ICD-10-CM

## 2018-10-18 MED ORDER — METHYLPREDNISOLONE ACETATE 40 MG/ML IJ SUSP
20.0000 mg | INTRAMUSCULAR | Status: AC | PRN
  Administered 2018-10-18: 20 mg

## 2018-10-18 MED ORDER — LIDOCAINE HCL (PF) 1 % IJ SOLN
1.0000 mL | INTRAMUSCULAR | Status: AC | PRN
  Administered 2018-10-18: 1 mL

## 2018-10-18 NOTE — Progress Notes (Signed)
xss

## 2018-10-18 NOTE — Progress Notes (Signed)
Office Visit Note   Patient: Jodi Duffy           Date of Birth: 07/14/1951           MRN: [id number] Visit Date: 10/18/2018              Requested by: Denita Lung, MD [place], [place] [id number] PCP: Denita Lung, MD   Assessment & Plan: Visit Diagnoses:  1. Pain in right elbow     Plan: Right tennis elbow.  Will inject with cortisone and monitor response  Follow-Up Instructions: Return if symptoms worsen or fail to improve.   Orders:  Orders Placed This Encounter  Procedures  . XR Elbow 2 Views Right   No orders of the defined types were placed in this encounter.     Procedures: Hand/UE Inj for lateral epicondylitis on 10/18/2018 10:54 AM Details: 27 G needle, lateral approach Medications: 1 mL lidocaine (PF) 1 %; 20 mg methylPREDNISolone acetate 40 MG/ML      Clinical Data: No additional findings.   Subjective: Chief Complaint  Patient presents with  . Right Elbow - Pain    NKI, began 1 week ago, played golf Friday and that had aggravated it.  Would like a cortisone injection in the elbow today  No obvious injury or trauma.  Pain is localized along the lateral aspect of the elbow.  No numbness or tingling or swelling.  Had some trouble sleeping related to pain and trying to find a comfortable position.  No skin changes  HPI  Review of Systems   Objective: Vital Signs: BP (!) 145/92 (BP Location: Left Arm, Patient Position: Sitting)   Pulse 75   Ht 5\' 3"  (1.6 m)   Wt 145 lb (65.8 kg)   BMI 25.69 kg/m   Physical Exam  Constitutional: She is oriented to person, place, and time. She appears well-developed and well-nourished.  HENT:  Mouth/Throat: Oropharynx is clear and moist.  Eyes: Pupils are equal, round, and reactive to light. EOM are normal.  Pulmonary/Chest: Effort normal.  Neurological: She is alert and oriented to person, place, and time.  Skin: Skin is warm and dry.  Psychiatric: She has a normal mood and  affect. Her behavior is normal.    Ortho Exam right elbow exam with tenderness over the lateral epicondyles.  Full flexion extension pronation and supination.  Some pain with grip more in extension with flexion.  No skin changes.  No grating or crepitation.  No medial pain Specialty Comments:  No specialty comments available.  Imaging: Xr Elbow 2 Views Right  Result Date: 10/18/2018 Films of the right elbow obtained in several projections.  There are no acute changes.  Negative fat pad.  No obvious arthritic changes.    PMFS History: Patient Active Problem List   Diagnosis Date Noted  . Pain in right elbow 10/18/2018  . Cotton wool spots 09/29/2018  . Epidermoid cyst of skin 03/17/2018  . Menopause present 03/17/2018  . Essential hypertension 02/10/2018  . Neck pain 05/29/2017  . Other seasonal allergic rhinitis 11/15/2015  . Family history of breast cancer in mother 09/06/2014  . Dysthymia 09/06/2014  . Glaucoma 09/06/2014  . Hyperlipidemia LDL goal <100 09/06/2014  . S/P TKR (total knee replacement) 08/12/2012  . Family history of heart disease in female family member before age 15 07/22/2012   Past Medical History:  Diagnosis Date  . Allergy    RHINITIS  . Arthritis   . Depression   .  Glaucoma   . Hyperlipidemia   . Hypertension   . Osteopenia   . TMJ syndrome     Family History  Problem Relation Age of Onset  . Heart disease Mother        valve replacement  . Cancer Mother        breast, throat  . Heart disease Father        died of brain embolism after hip surgery  . Arthritis Father   . Arthritis Brother   . Cancer Maternal Aunt        various cancers  . Diabetes Neg Hx   . Hypertension Neg Hx   . Stroke Neg Hx     Past Surgical History:  Procedure Laterality Date  . BIOPSY BREAST    . BLEPHAROPLASTY    . KNEE ARTHROSCOPY     right knee, 1990 and 2012, Dr. Durward Fortes  . TEMPOROMANDIBULAR JOINT SURGERY    . TONSILLECTOMY    . TOTAL KNEE  ARTHROPLASTY  08/10/2012   Procedure: TOTAL KNEE ARTHROPLASTY;  Surgeon: Garald Balding, MD;  Location: [facility];  Service: Orthopedics;  Laterality: Right;  Right Total Knee Arthroplasty   Social History   Occupational History  . Occupation: attorney    Employer: [employer]  Tobacco Use  . Smoking status: Former Smoker    Last attempt to quit: 02/10/1980    Years since quitting: 38.7  . Smokeless tobacco: Never Used  Substance and Sexual Activity  . Alcohol use: Yes    Alcohol/week: 1.0 standard drinks    Types: 1 Glasses of wine per week  . Drug use: No  . Sexual activity: Yes     Garald Balding, MD   Note - This record has been created using [employer].  Chart creation errors have been sought, but may not always  have been located. Such creation errors do not reflect on  the standard of medical care.

## 2018-10-19 ENCOUNTER — Other Ambulatory Visit: Payer: Self-pay | Admitting: Family Medicine

## 2018-10-19 DIAGNOSIS — F341 Dysthymic disorder: Secondary | ICD-10-CM

## 2018-10-19 NOTE — Telephone Encounter (Signed)
[facility] is requesting to fill pt effexor. Please advise. Kh

## 2018-11-01 ENCOUNTER — Other Ambulatory Visit: Payer: Self-pay | Admitting: Family Medicine

## 2018-11-01 DIAGNOSIS — E785 Hyperlipidemia, unspecified: Secondary | ICD-10-CM

## 2018-11-01 DIAGNOSIS — Z8249 Family history of ischemic heart disease and other diseases of the circulatory system: Secondary | ICD-10-CM

## 2018-11-18 ENCOUNTER — Encounter (INDEPENDENT_AMBULATORY_CARE_PROVIDER_SITE_OTHER): Payer: Self-pay | Admitting: Orthopaedic Surgery

## 2018-11-18 ENCOUNTER — Ambulatory Visit (INDEPENDENT_AMBULATORY_CARE_PROVIDER_SITE_OTHER): Payer: Medicare Other | Admitting: Orthopaedic Surgery

## 2018-11-18 VITALS — BP 166/98 | HR 83 | Wt 152.0 lb

## 2018-11-18 DIAGNOSIS — M25521 Pain in right elbow: Secondary | ICD-10-CM | POA: Diagnosis not present

## 2018-11-18 MED ORDER — LIDOCAINE HCL 1 % IJ SOLN
1.0000 mL | INTRAMUSCULAR | Status: AC | PRN
  Administered 2018-11-18: 1 mL

## 2018-11-18 MED ORDER — METHYLPREDNISOLONE ACETATE 40 MG/ML IJ SUSP
40.0000 mg | INTRAMUSCULAR | Status: AC | PRN
  Administered 2018-11-18: 40 mg

## 2018-11-18 NOTE — Progress Notes (Signed)
Right elbow--pt stated still having sharp pain and cortisone shot did not work at all.

## 2018-11-18 NOTE — Progress Notes (Signed)
Office Visit Note   Patient: Jodi Duffy           Date of Birth: 14-Feb-1951           MRN: [id number] Visit Date: 11/18/2018              Requested by: Denita Lung, MD [place],  [id number] PCP: Denita Lung, MD   Assessment & Plan: Visit Diagnoses:  1. Pain in right elbow     Plan: Right tennis elbow-persistent symptoms.  Will reinject with cortisone and monitor response.  If no improvement consider open MRI scan with Valium  Follow-Up Instructions: Return if symptoms worsen or fail to improve.   Orders:  No orders of the defined types were placed in this encounter.  No orders of the defined types were placed in this encounter.     Procedures: Hand/UE Inj: R elbow for lateral epicondylitis on 11/18/2018 4:39 PM Medications: 1 mL lidocaine 1 %; 40 mg methylPREDNISolone acetate 40 MG/ML      Clinical Data: No additional findings.   Subjective: Chief Complaint  Patient presents with  . Right Elbow - Follow-up  Jodi Duffy was seen 1 month ago for evaluation of right elbow pain.  Her symptoms were consistent with lateral epicondylitis or tennis elbow.  She had distinct tenderness directly over the lateral epicondyle.  I injected this with significant relief that area.  She now notes that every morning when she wakes up she will have some discomfort along the elbow that seems a little more proximal with some referred pain proximally rather than distally.  She has not had any loss of motion or skin change.  She does workout with weights once or twice a week.  She denies any neck pain or shoulder discomfort  HPI  Review of Systems   Objective: Vital Signs: BP (!) 166/98   Pulse 83   Wt 152 lb (68.9 kg)   BMI 26.93 kg/m   Physical Exam Constitutional:      Appearance: She is well-developed.  Eyes:     Pupils: Pupils are equal, round, and reactive to light.  Pulmonary:     Effort: Pulmonary effort is normal.  Skin:    General:  Skin is warm and dry.  Neurological:     Mental Status: She is alert and oriented to person, place, and time.  Psychiatric:        Behavior: Behavior normal.     Ortho Exam right elbow with full range of motion in pronation supination flexion and extension.  No induration.  No skin changes.  There is an area of local tenderness along the lateral epicondyles.  No shoulder pain.  No pain with range of motion of the cervical spine.  Good grip and good release.  Not much pain in the elbow with flexion extension with or without grip  Specialty Comments:  No specialty comments available.  Imaging: No results found.   PMFS History: Patient Active Problem List   Diagnosis Date Noted  . Pain in right elbow 10/18/2018  . Cotton wool spots 09/29/2018  . Epidermoid cyst of skin 03/17/2018  . Menopause present 03/17/2018  . Essential hypertension 02/10/2018  . Neck pain 05/29/2017  . Other seasonal allergic rhinitis 11/15/2015  . Family history of breast cancer in mother 09/06/2014  . Dysthymia 09/06/2014  . Glaucoma 09/06/2014  . Hyperlipidemia LDL goal <100 09/06/2014  . S/P TKR (total knee replacement) 08/12/2012  . Family history of heart  disease in female family member before age 17 07/22/2012   Past Medical History:  Diagnosis Date  . Allergy    RHINITIS  . Arthritis   . Depression   . Glaucoma   . Hyperlipidemia   . Hypertension   . Osteopenia   . TMJ syndrome     Family History  Problem Relation Age of Onset  . Heart disease Mother        valve replacement  . Cancer Mother        breast, throat  . Heart disease Father        died of brain embolism after hip surgery  . Arthritis Father   . Arthritis Brother   . Cancer Maternal Aunt        various cancers  . Diabetes Neg Hx   . Hypertension Neg Hx   . Stroke Neg Hx     Past Surgical History:  Procedure Laterality Date  . BIOPSY BREAST    . BLEPHAROPLASTY    . KNEE ARTHROSCOPY     right knee, 1990 and 2012,  Dr. Durward Fortes  . TEMPOROMANDIBULAR JOINT SURGERY    . TONSILLECTOMY    . TOTAL KNEE ARTHROPLASTY  08/10/2012   Procedure: TOTAL KNEE ARTHROPLASTY;  Surgeon: Garald Balding, MD;  Location: [facility];  Service: Orthopedics;  Laterality: Right;  Right Total Knee Arthroplasty   Social History   Occupational History  . Occupation: attorney    Employer: [employer]  Tobacco Use  . Smoking status: Former Smoker    Last attempt to quit: 02/10/1980    Years since quitting: 38.7  . Smokeless tobacco: Never Used  Substance and Sexual Activity  . Alcohol use: Yes    Alcohol/week: 1.0 standard drinks    Types: 1 Glasses of wine per week  . Drug use: No  . Sexual activity: Yes     Garald Balding, MD   Note - This record has been created using [employer].  Chart creation errors have been sought, but may not always  have been located. Such creation errors do not reflect on  the standard of medical care.

## 2018-11-22 ENCOUNTER — Other Ambulatory Visit (INDEPENDENT_AMBULATORY_CARE_PROVIDER_SITE_OTHER): Payer: Self-pay | Admitting: *Deleted

## 2018-11-22 ENCOUNTER — Telehealth (INDEPENDENT_AMBULATORY_CARE_PROVIDER_SITE_OTHER): Payer: Self-pay | Admitting: Orthopaedic Surgery

## 2018-11-22 DIAGNOSIS — M25521 Pain in right elbow: Secondary | ICD-10-CM

## 2018-11-22 MED ORDER — DIAZEPAM 5 MG PO TABS: ORAL_TABLET | ORAL | 0 refills | Status: DC

## 2018-11-22 NOTE — Telephone Encounter (Signed)
FYI - MRI right elbow w/o contrast ordered, 2 valium for anxiety called into pharmacy per last note, I called patient, pending appointment.

## 2018-11-22 NOTE — Telephone Encounter (Signed)
Patient left a voicemail stating she has decided that she would like to be referred for an MRI.

## 2018-11-23 NOTE — Telephone Encounter (Signed)
thanks

## 2018-11-26 ENCOUNTER — Ambulatory Visit
Admission: RE | Admit: 2018-11-26 | Discharge: 2018-11-26 | Disposition: A | Discharge status: Home / Self Care | Payer: Medicare Other | Source: Ambulatory Visit | Attending: Orthopaedic Surgery | Admitting: Orthopaedic Surgery

## 2018-11-26 DIAGNOSIS — M25521 Pain in right elbow: Secondary | ICD-10-CM | POA: Diagnosis not present

## 2018-11-28 ENCOUNTER — Other Ambulatory Visit: Payer: PRIVATE HEALTH INSURANCE

## 2018-11-29 ENCOUNTER — Telehealth (INDEPENDENT_AMBULATORY_CARE_PROVIDER_SITE_OTHER): Payer: Self-pay | Admitting: Orthopaedic Surgery

## 2018-11-29 DIAGNOSIS — H40053 Ocular hypertension, bilateral: Secondary | ICD-10-CM | POA: Diagnosis not present

## 2018-11-29 DIAGNOSIS — G510 Bell's palsy: Secondary | ICD-10-CM | POA: Diagnosis not present

## 2018-11-29 DIAGNOSIS — H04221 Epiphora due to insufficient drainage, right lacrimal gland: Secondary | ICD-10-CM | POA: Diagnosis not present

## 2018-11-29 DIAGNOSIS — H40033 Anatomical narrow angle, bilateral: Secondary | ICD-10-CM | POA: Diagnosis not present

## 2018-11-29 DIAGNOSIS — H04123 Dry eye syndrome of bilateral lacrimal glands: Secondary | ICD-10-CM | POA: Diagnosis not present

## 2018-11-29 NOTE — Telephone Encounter (Signed)
Patient called stating she had a missed call from our office and is checking if it was Dr. Durward Fortes with the results of her MRI.  Patient requested a return call.  Patient did not want to schedule an MRI review appointment stating Dr. Durward Fortes told her he would give her the results over the phone.

## 2018-11-30 ENCOUNTER — Ambulatory Visit (INDEPENDENT_AMBULATORY_CARE_PROVIDER_SITE_OTHER): Payer: Self-pay | Admitting: Orthopaedic Surgery

## 2018-11-30 NOTE — Telephone Encounter (Signed)
Please call patient. Results printed and on your desk.

## 2018-11-30 NOTE — Telephone Encounter (Signed)
Called results.

## 2019-01-04 ENCOUNTER — Other Ambulatory Visit (INDEPENDENT_AMBULATORY_CARE_PROVIDER_SITE_OTHER): Payer: Self-pay | Admitting: Orthopedic Surgery

## 2019-01-04 ENCOUNTER — Telehealth (INDEPENDENT_AMBULATORY_CARE_PROVIDER_SITE_OTHER): Payer: Self-pay | Admitting: Orthopaedic Surgery

## 2019-01-04 MED ORDER — DICLOFENAC SODIUM 1 % TD GEL: 2.0000 g | Freq: Four times a day (QID) | TRANSDERMAL | 1 refills | Status: DC

## 2019-01-04 NOTE — Telephone Encounter (Signed)
Patient called stating her right elbow is still painful and doesn't seem to be improving.  Patient requested a return call to let her know if Dr. Durward Fortes would recommend physical therapy.  Patient is aware that Dr. Durward Fortes is not in the office this week.

## 2019-01-04 NOTE — Telephone Encounter (Signed)
Any thoughts or recommendations?

## 2019-01-14 ENCOUNTER — Other Ambulatory Visit: Payer: Self-pay | Admitting: Family Medicine

## 2019-01-14 DIAGNOSIS — Z8249 Family history of ischemic heart disease and other diseases of the circulatory system: Secondary | ICD-10-CM

## 2019-01-14 DIAGNOSIS — E785 Hyperlipidemia, unspecified: Secondary | ICD-10-CM

## 2019-01-18 ENCOUNTER — Ambulatory Visit (INDEPENDENT_AMBULATORY_CARE_PROVIDER_SITE_OTHER): Payer: Medicare Other | Admitting: Orthopaedic Surgery

## 2019-01-18 ENCOUNTER — Encounter (INDEPENDENT_AMBULATORY_CARE_PROVIDER_SITE_OTHER): Payer: Self-pay | Admitting: Orthopaedic Surgery

## 2019-01-18 ENCOUNTER — Ambulatory Visit (INDEPENDENT_AMBULATORY_CARE_PROVIDER_SITE_OTHER): Payer: Medicare Other

## 2019-01-18 VITALS — BP 129/73 | HR 77 | Ht 63.0 in | Wt 148.0 lb

## 2019-01-18 DIAGNOSIS — M25562 Pain in left knee: Secondary | ICD-10-CM | POA: Diagnosis not present

## 2019-01-18 DIAGNOSIS — M170 Bilateral primary osteoarthritis of knee: Secondary | ICD-10-CM | POA: Diagnosis not present

## 2019-01-18 MED ORDER — METHYLPREDNISOLONE ACETATE 40 MG/ML IJ SUSP
80.0000 mg | INTRAMUSCULAR | Status: AC | PRN
  Administered 2019-01-18: 80 mg via INTRA_ARTICULAR

## 2019-01-18 MED ORDER — LIDOCAINE HCL 1 % IJ SOLN
2.0000 mL | INTRAMUSCULAR | Status: AC | PRN
  Administered 2019-01-18: 2 mL

## 2019-01-18 MED ORDER — BUPIVACAINE HCL 0.5 % IJ SOLN
2.0000 mL | INTRAMUSCULAR | Status: AC | PRN
  Administered 2019-01-18: 2 mL via INTRA_ARTICULAR

## 2019-01-18 NOTE — Progress Notes (Signed)
Office Visit Note   Patient: Jodi Duffy           Date of Birth: 29-Jan-1951           MRN: [id number] Visit Date: 01/18/2019              Requested by: Denita Lung, MD [place], [place] [address] PCP: Denita Lung, MD   Assessment & Plan: Visit Diagnoses:  1. Acute pain of left knee   2. Bilateral primary osteoarthritis of knee     Plan: Osteoarthritis left knee with acute exacerbation of pain and effusion.  We will aspirate the knee and inject cortisone.  Long discussion regarding future treatment options including Visco supplementation  Follow-Up Instructions: Return if symptoms worsen or fail to improve.   Orders:  Orders Placed This Encounter  Procedures  . Large Joint Inj: L knee  . XR KNEE 3 VIEW LEFT   No orders of the defined types were placed in this encounter.     Procedures: Large Joint Inj: L knee on 01/18/2019 11:47 AM Indications: pain and diagnostic evaluation Details: 25 G 1.5 in needle, anteromedial approach  Arthrogram: No  Medications: 2 mL lidocaine 1 %; 2 mL bupivacaine 0.5 %; 80 mg methylPREDNISolone acetate 40 MG/ML Aspirate: 20 mL clear Outcome: tolerated well, no immediate complications Procedure, treatment alternatives, risks and benefits explained, specific risks discussed. Consent was given by the patient. Patient was prepped and draped in the usual sterile fashion.       Clinical Data: No additional findings.   Subjective: Chief Complaint  Patient presents with  . Left Knee - Pain  Patient presents today for left knee pain. She said that it has been hurting for months. No known injury. She said it hurts with stairs, getting up or sitting down. Sometimes the pain will wake her at night. She does not take anything for pain except occasionally an Aleve. She has not had any previous treatment for the left knee. No popping, clicking, or grinding. 7 years status post primary right total knee replacement  without any problems.  HPI  Review of Systems   Objective: Vital Signs: BP 129/73   Pulse 77   Ht 5\' 3"  (1.6 m)   Wt 148 lb (67.1 kg)   BMI 26.22 kg/m   Physical Exam Constitutional:      Appearance: She is well-developed.  Eyes:     Pupils: Pupils are equal, round, and reactive to light.  Pulmonary:     Effort: Pulmonary effort is normal.  Skin:    General: Skin is warm and dry.  Neurological:     Mental Status: She is alert and oriented to person, place, and time.  Psychiatric:        Behavior: Behavior normal.     Ortho Exam awake alert and oriented x3.  Comfortable sitting.  Small effusion left knee with predominant lateral joint pain which was mild.  Some patellar crepitation.  No instability.  Slight increased valgus with weightbearing.  No distal edema.  Neurovascular exam intact  Specialty Comments:  No specialty comments available.  Imaging: Xr Knee 3 View Left  Result Date: 01/18/2019 Films of the left knee were obtained in 3 projections standing.  There is some degenerative change in all 3 compartments but predominate in the lateral compartment with there are areas of subchondral sclerosis on the femoral condyle and less so in the tibial plateau.  There is small peripheral osteophytes.  May be  slight increased valgus but joint spaces are well-maintained.  There are degenerative changes at the patellofemoral joint as well.  No acute changes.  No ectopic calcification.  Films are consistent with moderate osteoarthritis    PMFS History: Patient Active Problem List   Diagnosis Date Noted  . Bilateral primary osteoarthritis of knee 01/18/2019  . Acute pain of left knee 01/18/2019  . Pain in right elbow 10/18/2018  . Cotton wool spots 09/29/2018  . Epidermoid cyst of skin 03/17/2018  . Menopause present 03/17/2018  . Essential hypertension 02/10/2018  . Neck pain 05/29/2017  . Other seasonal allergic rhinitis 11/15/2015  . Family history of breast cancer in  mother 09/06/2014  . Dysthymia 09/06/2014  . Glaucoma 09/06/2014  . Hyperlipidemia LDL goal <100 09/06/2014  . S/P TKR (total knee replacement) 08/12/2012  . Family history of heart disease in female family member before age 19 07/22/2012   Past Medical History:  Diagnosis Date  . Allergy    RHINITIS  . Arthritis   . Depression   . Glaucoma   . Hyperlipidemia   . Hypertension   . Osteopenia   . TMJ syndrome     Family History  Problem Relation Age of Onset  . Heart disease Mother        valve replacement  . Cancer Mother        breast, throat  . Heart disease Father        died of brain embolism after hip surgery  . Arthritis Father   . Arthritis Brother   . Cancer Maternal Aunt        various cancers  . Diabetes Neg Hx   . Hypertension Neg Hx   . Stroke Neg Hx     Past Surgical History:  Procedure Laterality Date  . BIOPSY BREAST    . BLEPHAROPLASTY    . KNEE ARTHROSCOPY     right knee, 1990 and 2012, Dr. Durward Fortes  . TEMPOROMANDIBULAR JOINT SURGERY    . TONSILLECTOMY    . TOTAL KNEE ARTHROPLASTY  08/10/2012   Procedure: TOTAL KNEE ARTHROPLASTY;  Surgeon: Garald Balding, MD;  Location: [facility];  Service: Orthopedics;  Laterality: Right;  Right Total Knee Arthroplasty   Social History   Occupational History  . Occupation: attorney    Employer: [employer]  Tobacco Use  . Smoking status: Former Smoker    Last attempt to quit: 02/10/1980    Years since quitting: 38.9  . Smokeless tobacco: Never Used  Substance and Sexual Activity  . Alcohol use: Yes    Alcohol/week: 1.0 standard drinks    Types: 1 Glasses of wine per week  . Drug use: No  . Sexual activity: Yes

## 2019-01-25 ENCOUNTER — Telehealth: Payer: Self-pay | Admitting: Family Medicine

## 2019-01-25 NOTE — Telephone Encounter (Signed)
Let her know that we need to do a hearing evaluation first.  Let her know that Dr. Thornell Mule seems to be specializing in  transplants

## 2019-01-25 NOTE — Telephone Encounter (Signed)
Pt called and is requesting a referral to go to the ENT Dr Vicie Mutters for her hearing, pt can be reached at [phone]

## 2019-01-26 NOTE — Telephone Encounter (Signed)
Called pt to advise of appt needed before a referral is placed. [place]

## 2019-02-08 ENCOUNTER — Telehealth: Payer: Self-pay

## 2019-02-08 NOTE — Telephone Encounter (Signed)
Pt has been treated in the past for hearing  Issues. Pt was seen last week for tune up on hearing aids. Pt says a referral is needed to have a new eval with Dr. Oswaldo Done. Please advise pt has upcoming appt 02-14-19 and referral will need to be in by than. [place]

## 2019-02-08 NOTE — Telephone Encounter (Signed)
Pt just needs referral to Dr. Thornell Mule, she already has an appt set up.  Is it ok for referral to Dr Thornell Mule?

## 2019-02-08 NOTE — Telephone Encounter (Signed)
I am not sure what the question is but check with Shreena and get it taken care of

## 2019-02-09 NOTE — Telephone Encounter (Signed)
yes

## 2019-02-10 NOTE — Telephone Encounter (Signed)
Kim please do referral, thanks

## 2019-02-14 ENCOUNTER — Other Ambulatory Visit: Payer: Self-pay

## 2019-02-14 DIAGNOSIS — H9190 Unspecified hearing loss, unspecified ear: Secondary | ICD-10-CM

## 2019-02-15 NOTE — Telephone Encounter (Signed)
Done

## 2019-02-24 ENCOUNTER — Telehealth (INDEPENDENT_AMBULATORY_CARE_PROVIDER_SITE_OTHER): Payer: Self-pay

## 2019-02-24 ENCOUNTER — Telehealth (INDEPENDENT_AMBULATORY_CARE_PROVIDER_SITE_OTHER): Payer: Self-pay | Admitting: Orthopaedic Surgery

## 2019-02-24 ENCOUNTER — Other Ambulatory Visit: Payer: Self-pay

## 2019-02-24 ENCOUNTER — Encounter (INDEPENDENT_AMBULATORY_CARE_PROVIDER_SITE_OTHER): Payer: Self-pay | Admitting: Orthopaedic Surgery

## 2019-02-24 ENCOUNTER — Ambulatory Visit (INDEPENDENT_AMBULATORY_CARE_PROVIDER_SITE_OTHER): Payer: Medicare Other | Admitting: Orthopaedic Surgery

## 2019-02-24 VITALS — BP 121/74 | HR 97 | Ht 63.0 in | Wt 148.0 lb

## 2019-02-24 DIAGNOSIS — M1712 Unilateral primary osteoarthritis, left knee: Secondary | ICD-10-CM

## 2019-02-24 NOTE — Telephone Encounter (Signed)
Please pre-cert visco for left knee for Dr. Durward Fortes patient. Thank you.

## 2019-02-24 NOTE — Telephone Encounter (Signed)
Please precert Left knee visco injections for patient. This is Dr.Whitfield's patient.

## 2019-02-24 NOTE — Progress Notes (Signed)
Office Visit Note   Patient: Jodi Duffy           Date of Birth: 12-27-1950           MRN: [id number] Visit Date: 02/24/2019              Requested by: Denita Lung, MD [place], [place] [address] PCP: Denita Lung, MD   Assessment & Plan: Visit Diagnoses:  1. Unilateral primary osteoarthritis, left knee     Plan:  #1: At this time we will plan on precertified for Visco supplementation. #2: Once that is received we will give her a call and inject those #3: Instructed in exercise program #4: Use of Voltaren gel to the left knee to see if this will be beneficial.  Follow-Up Instructions: Return Once through for Visco supplementation.   Orders:  No orders of the defined types were placed in this encounter.  No orders of the defined types were placed in this encounter.     Procedures: No procedures performed   Clinical Data: No additional findings.   Subjective: Chief Complaint  Patient presents with  . Left Knee - Follow-up   HPI Patient presents today for follow up on her left knee. She was here 5 weeks ago and received a cortisone injection in her left knee. Patient states that the injection lasted about two weeks and started to hurt again for the past 2 to 3 days I have been quite comfortable.   She is taking Aleve as needed.  Stairs are her worst for the left knee.  Denies any giving way.   Review of Systems  Constitutional: Negative for fatigue.  HENT: Negative for ear pain.   Eyes: Negative for pain.  Respiratory: Negative for shortness of breath.   Cardiovascular: Negative for leg swelling.  Gastrointestinal: Negative for constipation and diarrhea.  Endocrine: Negative for cold intolerance and heat intolerance.  Genitourinary: Negative for difficulty urinating.  Musculoskeletal: Negative for joint swelling.  Skin: Negative for rash.  Allergic/Immunologic: Negative for food allergies.  Neurological: Negative for weakness.   Hematological: Does not bruise/bleed easily.  Psychiatric/Behavioral: Positive for sleep disturbance.     Objective: Vital Signs: BP 121/74   Pulse 97   Ht 5\' 3"  (1.6 m)   Wt 148 lb (67.1 kg)   BMI 26.22 kg/m   Physical Exam Constitutional:      Appearance: She is well-developed.  Eyes:     Pupils: Pupils are equal, round, and reactive to light.  Pulmonary:     Effort: Pulmonary effort is normal.  Skin:    General: Skin is warm and dry.  Neurological:     Mental Status: She is alert and oriented to person, place, and time.  Psychiatric:        Behavior: Behavior normal.     Ortho Exam  Today she has a small effusion but not tense.  She has range of motion from full extension to 105 degrees.  She does have some patellofemoral crepitance with range of motion.  I am able to great crepitance also with patellar trapping.  Good ligament stability otherwise.  Joint lines are benign.  Specialty Comments:  No specialty comments available.  Imaging: No results found.   PMFS History: Patient Active Problem List   Diagnosis Date Noted  . Bilateral primary osteoarthritis of knee 01/18/2019  . Acute pain of left knee 01/18/2019  . Pain in right elbow 10/18/2018  . Cotton wool spots 09/29/2018  .  Epidermoid cyst of skin 03/17/2018  . Menopause present 03/17/2018  . Essential hypertension 02/10/2018  . Neck pain 05/29/2017  . Other seasonal allergic rhinitis 11/15/2015  . Family history of breast cancer in mother 09/06/2014  . Dysthymia 09/06/2014  . Glaucoma 09/06/2014  . Hyperlipidemia LDL goal <100 09/06/2014  . S/P TKR (total knee replacement) 08/12/2012  . Family history of heart disease in female family member before age 34 07/22/2012   Past Medical History:  Diagnosis Date  . Allergy    RHINITIS  . Arthritis   . Depression   . Glaucoma   . Hyperlipidemia   . Hypertension   . Osteopenia   . TMJ syndrome     Family History  Problem Relation Age of Onset  .  Heart disease Mother        valve replacement  . Cancer Mother        breast, throat  . Heart disease Father        died of brain embolism after hip surgery  . Arthritis Father   . Arthritis Brother   . Cancer Maternal Aunt        various cancers  . Diabetes Neg Hx   . Hypertension Neg Hx   . Stroke Neg Hx     Past Surgical History:  Procedure Laterality Date  . BIOPSY BREAST    . BLEPHAROPLASTY    . KNEE ARTHROSCOPY     right knee, 1990 and 2012, Dr. Durward Fortes  . TEMPOROMANDIBULAR JOINT SURGERY    . TONSILLECTOMY    . TOTAL KNEE ARTHROPLASTY  08/10/2012   Procedure: TOTAL KNEE ARTHROPLASTY;  Surgeon: Garald Balding, MD;  Location: [facility];  Service: Orthopedics;  Laterality: Right;  Right Total Knee Arthroplasty   Social History   Occupational History  . Occupation: attorney    Employer: [employer]  Tobacco Use  . Smoking status: Former Smoker    Last attempt to quit: 02/10/1980    Years since quitting: 39.0  . Smokeless tobacco: Never Used  Substance and Sexual Activity  . Alcohol use: Yes    Alcohol/week: 1.0 standard drinks    Types: 1 Glasses of wine per week  . Drug use: No  . Sexual activity: Yes

## 2019-02-24 NOTE — Telephone Encounter (Signed)
Error

## 2019-03-07 NOTE — Telephone Encounter (Signed)
Noted  

## 2019-03-09 ENCOUNTER — Telehealth (INDEPENDENT_AMBULATORY_CARE_PROVIDER_SITE_OTHER): Payer: Self-pay

## 2019-03-09 NOTE — Telephone Encounter (Signed)
Submitted VOB for Orthovisc series, left knee.

## 2019-03-10 ENCOUNTER — Telehealth (INDEPENDENT_AMBULATORY_CARE_PROVIDER_SITE_OTHER): Payer: Self-pay

## 2019-03-10 NOTE — Telephone Encounter (Signed)
Please schedule patient an appointment with Dr. Durward Fortes for gel injection.  Thank You  Patient is approved for Orthovisc series, left knee. Buy & Bill Covered at 100% through her insurance. No Co-pay No PA required

## 2019-03-15 ENCOUNTER — Other Ambulatory Visit: Payer: Self-pay

## 2019-03-15 ENCOUNTER — Ambulatory Visit (INDEPENDENT_AMBULATORY_CARE_PROVIDER_SITE_OTHER): Payer: Medicare Other | Admitting: Orthopaedic Surgery

## 2019-03-15 ENCOUNTER — Encounter (INDEPENDENT_AMBULATORY_CARE_PROVIDER_SITE_OTHER): Payer: Self-pay | Admitting: Orthopaedic Surgery

## 2019-03-15 VITALS — BP 113/76 | HR 73 | Ht 63.0 in | Wt 148.0 lb

## 2019-03-15 DIAGNOSIS — M1712 Unilateral primary osteoarthritis, left knee: Secondary | ICD-10-CM

## 2019-03-15 MED ORDER — LIDOCAINE HCL 1 % IJ SOLN
2.0000 mL | INTRAMUSCULAR | Status: AC | PRN
  Administered 2019-03-15: 2 mL

## 2019-03-15 MED ORDER — HYALURONAN 30 MG/2ML IX SOSY
30.0000 mg | PREFILLED_SYRINGE | INTRA_ARTICULAR | Status: AC | PRN
  Administered 2019-03-15: 30 mg via INTRA_ARTICULAR

## 2019-03-15 NOTE — Progress Notes (Signed)
Office Visit Note   Patient: Jodi Duffy           Date of Birth: 04-Oct-1951           MRN: [id number] Visit Date: 03/15/2019              Requested by: Denita Lung, MD [place],  [address] PCP: Denita Lung, MD   Assessment & Plan: Visit Diagnoses:  1. Unilateral primary osteoarthritis, left knee     Plan: #1: First Orthovisc injection was given to the left knee without difficulty. 2: Follow back up 1 week for the second Orthovisc to the left knee.  Follow-Up Instructions: Return in about 1 week (around 03/22/2019).   Orders:  No orders of the defined types were placed in this encounter.  No orders of the defined types were placed in this encounter.     Procedures: Large Joint Inj: L knee on 03/15/2019 8:52 AM Indications: pain and joint swelling Details: 25 G 1.5 in needle, anteromedial approach  Arthrogram: No  Medications: 2 mL lidocaine 1 %; 30 mg Hyaluronan 30 MG/2ML Outcome: tolerated well, no immediate complications Procedure, treatment alternatives, risks and benefits explained, specific risks discussed. Consent was given by the patient. Immediately prior to procedure a time out was called to verify the correct patient, procedure, equipment, support staff and site/side marked as required. Patient was prepped and draped in the usual sterile fashion.       Clinical Data: No additional findings.   Subjective: Chief Complaint  Patient presents with   Left Knee - Pain    orthovisc starting 03/15/19  Patient presents today for orthovisc in her left knee. This is her first injection of the series. She is taking Aleve for pain.  HPI  Jodi Duffy is a very pleasant 68 year old white female who is seen today for evaluation.  She was last seen on February 24, 2019 and at that time was 5 weeks as post corticosteroid injection to the left knee.  The injection lasted only 2 weeks.  She started hurting again and Visco supplementation was  ordered.  She comes in today for her first Orthovisc injection.  She is using Voltaren gel and is very happy with the results that she is getting with that.  Review of Systems  Constitutional: Negative for fatigue.  HENT: Negative for ear pain.   Eyes: Negative for pain.  Respiratory: Negative for shortness of breath.   Cardiovascular: Negative for leg swelling.  Gastrointestinal: Negative for constipation and diarrhea.  Endocrine: Negative for cold intolerance and heat intolerance.  Genitourinary: Negative for difficulty urinating.  Musculoskeletal: Negative for joint swelling.  Skin: Negative for rash.  Allergic/Immunologic: Negative for food allergies.  Neurological: Negative for weakness.  Hematological: Does not bruise/bleed easily.  Psychiatric/Behavioral: Positive for sleep disturbance.     Objective: Vital Signs: BP 113/76    Pulse 73    Ht 5\' 3"  (1.6 m)    Wt 148 lb (67.1 kg)    BMI 26.22 kg/m   Physical Exam Constitutional:      Appearance: She is well-developed.  Eyes:     Pupils: Pupils are equal, round, and reactive to light.  Pulmonary:     Effort: Pulmonary effort is normal.  Skin:    General: Skin is warm and dry.  Neurological:     Mental Status: She is alert and oriented to person, place, and time.  Psychiatric:        Behavior: Behavior normal.  Ortho Exam  Exam today reveals a small effusion.  Her motion full extension 205 degrees.  Patellofemoral crepitance with range of motion.  Good ligament stability.  Calf supple nontender.  Neurovascular intact distally.  Specialty Comments:  No specialty comments available.  Imaging: No results found.   PMFS History: Current Outpatient Medications  Medication Sig Dispense Refill   atorvastatin (LIPITOR) 10 MG tablet TAKE 1 TABLET(10 MG) BY MOUTH DAILY 90 tablet 0   cetirizine-pseudoephedrine (ZYRTEC-D) 5-120 MG per tablet Take 1 tablet by mouth daily as needed. Reported on 01/04/2016      cholecalciferol (VITAMIN D) 1000 units tablet Take 1,000 Units by mouth daily.     COMBIGAN 0.2-0.5 % ophthalmic solution PLACE 1 DROP INTO BOTH EYES BID  3   desonide (DESOWEN) 0.05 % cream desonide 0.05 % topical cream     diazepam (VALIUM) 5 MG tablet Take 1 tablet 30 minutes before scan, then if needed, take 1 tablet prior to scan for MRI  anxiety. 2 tablet 0   diclofenac sodium (VOLTAREN) 1 % GEL Apply 2 g topically 4 (four) times daily. 3 Tube 1   latanoprost (XALATAN) 0.005 % ophthalmic solution Place 1 drop into both eyes at bedtime.     LUMIGAN 0.01 % SOLN INSTILL 1 DROP INTO BOTH EYES QHS  3   Multiple Vitamins-Minerals (MULTIVITAMIN WITH MINERALS) tablet Take 1 tablet by mouth daily.     neomycin-polymyxin b-dexamethasone (MAXITROL) 3.5-10000-0.1 SUSP Use 1 drop 4x a day in the right eye for 2 weeks then 1 drop 2x a day for 2 weeks then stop.     neomycin-polymyxin b-dexamethasone (MAXITROL) 3.5-10000-0.1 SUSP      olmesartan-hydrochlorothiazide (BENICAR HCT) 40-12.5 MG tablet Take 1 tablet by mouth daily. 90 tablet 3   sulfamethoxazole-trimethoprim (BACTRIM DS,SEPTRA DS) 800-160 MG tablet Take 1 tablet by mouth 2 (two) times daily. 20 tablet 0   venlafaxine XR (EFFEXOR-XR) 150 MG 24 hr capsule TAKE 1 CAPSULE(150 MG) BY MOUTH DAILY WITH BREAKFAST 90 capsule 3   venlafaxine XR (EFFEXOR-XR) 150 MG 24 hr capsule TAKE 1 CAPSULE(150 MG) BY MOUTH DAILY WITH BREAKFAST 90 capsule 1   vitamin B-12 (CYANOCOBALAMIN) 1000 MCG tablet Take 1,000 mcg by mouth daily.     vitamin C (ASCORBIC ACID) 500 MG tablet Take 500 mg by mouth daily.     No current facility-administered medications for this visit.     Patient Active Problem List   Diagnosis Date Noted   Bilateral primary osteoarthritis of knee 01/18/2019   Acute pain of left knee 01/18/2019   Pain in right elbow 10/18/2018   Cotton wool spots 09/29/2018   Epidermoid cyst of skin 03/17/2018   Menopause present 03/17/2018    Essential hypertension 02/10/2018   Neck pain 05/29/2017   Other seasonal allergic rhinitis 11/15/2015   Family history of breast cancer in mother 09/06/2014   Dysthymia 09/06/2014   Glaucoma 09/06/2014   Hyperlipidemia LDL goal <100 09/06/2014   S/P TKR (total knee replacement) 08/12/2012   Family history of heart disease in female family member before age 75 07/22/2012   Past Medical History:  Diagnosis Date   Allergy    RHINITIS   Arthritis    Depression    Glaucoma    Hyperlipidemia    Hypertension    Osteopenia    TMJ syndrome     Family History  Problem Relation Age of Onset   Heart disease Mother        valve replacement  Cancer Mother        breast, throat   Heart disease Father        died of brain embolism after hip surgery   Arthritis Father    Arthritis Brother    Cancer Maternal Aunt        various cancers   Diabetes Neg Hx    Hypertension Neg Hx    Stroke Neg Hx     Past Surgical History:  Procedure Laterality Date   BIOPSY BREAST     BLEPHAROPLASTY     KNEE ARTHROSCOPY     right knee, 1990 and 2012, Dr. Ty Hilts JOINT SURGERY     TONSILLECTOMY     TOTAL KNEE ARTHROPLASTY  08/10/2012   Procedure: TOTAL KNEE ARTHROPLASTY;  Surgeon: Garald Balding, MD;  Location: [facility];  Service: Orthopedics;  Laterality: Right;  Right Total Knee Arthroplasty   Social History   Occupational History   Occupation: attorney    Employer: [employer]  Tobacco Use   Smoking status: Former Smoker    Last attempt to quit: 02/10/1980    Years since quitting: 39.1   Smokeless tobacco: Never Used  Substance and Sexual Activity   Alcohol use: Yes    Alcohol/week: 1.0 standard drinks    Types: 1 Glasses of wine per week   Drug use: No   Sexual activity: Yes

## 2019-03-22 ENCOUNTER — Other Ambulatory Visit: Payer: Self-pay

## 2019-03-22 ENCOUNTER — Encounter: Payer: Self-pay | Admitting: Orthopaedic Surgery

## 2019-03-22 ENCOUNTER — Ambulatory Visit (INDEPENDENT_AMBULATORY_CARE_PROVIDER_SITE_OTHER): Payer: Medicare Other | Admitting: Orthopaedic Surgery

## 2019-03-22 VITALS — BP 138/79 | HR 72 | Ht 63.0 in | Wt 148.0 lb

## 2019-03-22 DIAGNOSIS — M170 Bilateral primary osteoarthritis of knee: Secondary | ICD-10-CM | POA: Diagnosis not present

## 2019-03-22 DIAGNOSIS — M25562 Pain in left knee: Secondary | ICD-10-CM

## 2019-03-22 DIAGNOSIS — M1712 Unilateral primary osteoarthritis, left knee: Secondary | ICD-10-CM

## 2019-03-22 MED ORDER — HYALURONAN 30 MG/2ML IX SOSY
30.0000 mg | PREFILLED_SYRINGE | INTRA_ARTICULAR | Status: AC | PRN
  Administered 2019-03-22: 30 mg via INTRA_ARTICULAR

## 2019-03-22 NOTE — Progress Notes (Signed)
Office Visit Note   Patient: Jodi Duffy           Date of Birth: 1951/04/26           MRN: [id number] Visit Date: 03/22/2019              Requested by: Denita Lung, MD [place], [place] [address] PCP: Denita Lung, MD   Assessment & Plan: Visit Diagnoses:  1. Bilateral primary osteoarthritis of knee   2. Acute pain of left knee     Plan: Second Orthovisc injection left knee.  Return next week to complete the series of 3  Follow-Up Instructions: Return in about 1 week (around 03/29/2019).   Orders:  Orders Placed This Encounter  Procedures  . Large Joint Inj: L knee   No orders of the defined types were placed in this encounter.     Procedures: Large Joint Inj: L knee on 03/22/2019 9:16 AM Indications: pain and joint swelling Details: 25 G 1.5 in needle, anteromedial approach  Arthrogram: No  Medications: 30 mg Hyaluronan 30 MG/2ML Outcome: tolerated well, no immediate complications Procedure, treatment alternatives, risks and benefits explained, specific risks discussed. Consent was given by the patient. Immediately prior to procedure a time out was called to verify the correct patient, procedure, equipment, support staff and site/side marked as required. Patient was prepped and draped in the usual sterile fashion.       Clinical Data: No additional findings.   Subjective: Chief Complaint  Patient presents with  . Left Knee - Follow-up    orthovisc started 03/15/19  Patient presents today for the second orthovisc injection in her left knee. She started the injections on 03/15/19. Patient states that there are times that it seems to have improved some.   HPI  Review of Systems  Constitutional: Negative for fatigue.  HENT: Negative for ear pain.   Eyes: Negative for pain.  Respiratory: Negative for shortness of breath.   Cardiovascular: Negative for leg swelling.  Gastrointestinal: Negative for constipation and diarrhea.   Endocrine: Negative for cold intolerance and heat intolerance.  Genitourinary: Negative for difficulty urinating.  Musculoskeletal: Negative for joint swelling.  Skin: Negative for rash.  Allergic/Immunologic: Negative for food allergies.  Neurological: Negative for weakness.  Hematological: Does not bruise/bleed easily.  Psychiatric/Behavioral: Negative for sleep disturbance.     Objective: Vital Signs: BP 138/79   Pulse 72   Ht 5\' 3"  (1.6 m)   Wt 148 lb (67.1 kg)   BMI 26.22 kg/m   Physical Exam  Ortho Exam no effusion left knee.  No increased warmth full extension some patellar crepitation.  No calf pain.  No distal edema  Specialty Comments:  No specialty comments available.  Imaging: No results found.   PMFS History: Patient Active Problem List   Diagnosis Date Noted  . Bilateral primary osteoarthritis of knee 01/18/2019  . Acute pain of left knee 01/18/2019  . Pain in right elbow 10/18/2018  . Cotton wool spots 09/29/2018  . Epidermoid cyst of skin 03/17/2018  . Menopause present 03/17/2018  . Essential hypertension 02/10/2018  . Neck pain 05/29/2017  . Other seasonal allergic rhinitis 11/15/2015  . Family history of breast cancer in mother 09/06/2014  . Dysthymia 09/06/2014  . Glaucoma 09/06/2014  . Hyperlipidemia LDL goal <100 09/06/2014  . S/P TKR (total knee replacement) 08/12/2012  . Family history of heart disease in female family member before age 67 07/22/2012   Past Medical History:  Diagnosis Date  . Allergy    RHINITIS  . Arthritis   . Depression   . Glaucoma   . Hyperlipidemia   . Hypertension   . Osteopenia   . TMJ syndrome     Family History  Problem Relation Age of Onset  . Heart disease Mother        valve replacement  . Cancer Mother        breast, throat  . Heart disease Father        died of brain embolism after hip surgery  . Arthritis Father   . Arthritis Brother   . Cancer Maternal Aunt        various cancers  .  Diabetes Neg Hx   . Hypertension Neg Hx   . Stroke Neg Hx     Past Surgical History:  Procedure Laterality Date  . BIOPSY BREAST    . BLEPHAROPLASTY    . KNEE ARTHROSCOPY     right knee, 1990 and 2012, Dr. Durward Fortes  . TEMPOROMANDIBULAR JOINT SURGERY    . TONSILLECTOMY    . TOTAL KNEE ARTHROPLASTY  08/10/2012   Procedure: TOTAL KNEE ARTHROPLASTY;  Surgeon: Garald Balding, MD;  Location: [facility];  Service: Orthopedics;  Laterality: Right;  Right Total Knee Arthroplasty   Social History   Occupational History  . Occupation: attorney    Employer: [employer]  Tobacco Use  . Smoking status: Former Smoker    Last attempt to quit: 02/10/1980    Years since quitting: 39.1  . Smokeless tobacco: Never Used  Substance and Sexual Activity  . Alcohol use: Yes    Alcohol/week: 1.0 standard drinks    Types: 1 Glasses of wine per week  . Drug use: No  . Sexual activity: Yes

## 2019-03-29 ENCOUNTER — Ambulatory Visit (INDEPENDENT_AMBULATORY_CARE_PROVIDER_SITE_OTHER): Payer: Medicare Other | Admitting: Orthopaedic Surgery

## 2019-03-29 ENCOUNTER — Other Ambulatory Visit: Payer: Self-pay

## 2019-03-29 DIAGNOSIS — M1712 Unilateral primary osteoarthritis, left knee: Secondary | ICD-10-CM | POA: Diagnosis not present

## 2019-03-29 MED ORDER — SODIUM HYALURONATE (VISCOSUP) 20 MG/2ML IX SOSY
20.0000 mg | PREFILLED_SYRINGE | INTRA_ARTICULAR | Status: AC | PRN
  Administered 2019-03-29: 10:00:00 20 mg via INTRA_ARTICULAR

## 2019-03-29 MED ORDER — HYALURONAN 30 MG/2ML IX SOSY
30.0000 mg | PREFILLED_SYRINGE | INTRA_ARTICULAR | Status: AC | PRN
  Administered 2019-03-29: 10:00:00 30 mg via INTRA_ARTICULAR

## 2019-03-29 MED ORDER — LIDOCAINE HCL 1 % IJ SOLN
2.0000 mL | INTRAMUSCULAR | Status: AC | PRN
  Administered 2019-03-29: 2 mL

## 2019-03-29 NOTE — Progress Notes (Signed)
Office Visit Note   Patient: Jodi Duffy           Date of Birth: Jun 16, 1951           MRN: [id number] Visit Date: 03/29/2019              Requested by: Denita Lung, MD [place], [place] [address] PCP: Denita Lung, MD   Assessment & Plan: Visit Diagnoses:  1. Unilateral primary osteoarthritis, left knee     Plan:  #1: Attempted aspiration of the knee failed to reveal any fluid. #2: The knee was injected with the third Orthovisc. #3: Follow back up in 1 month for recheck evaluation.  Follow-Up Instructions: Return if symptoms worsen or fail to improve.   Orders:  No orders of the defined types were placed in this encounter.  No orders of the defined types were placed in this encounter.     Procedures: Large Joint Inj: L knee on 03/29/2019 9:47 AM Indications: pain and joint swelling Details: 25 G 1.5 in needle, anteromedial approach  Arthrogram: No  Medications: 2 mL lidocaine 1 %; 20 mg Sodium Hyaluronate 20 MG/2ML; 30 mg Hyaluronan 30 MG/2ML Outcome: tolerated well, no immediate complications Procedure, treatment alternatives, risks and benefits explained, specific risks discussed. Consent was given by the patient. Immediately prior to procedure a time out was called to verify the correct patient, procedure, equipment, support staff and site/side marked as required. Patient was prepped and draped in the usual sterile fashion.       Clinical Data: No additional findings.   Subjective: No chief complaint on file.   HPI  Jodi Duffy is seen today for her final Orthovisc injection.  She does not feel that it has had much of a benefit.  Review of Systems  Constitutional: Negative for fatigue.  HENT: Negative for ear pain.   Eyes: Negative for pain.  Respiratory: Negative for shortness of breath.   Cardiovascular: Negative for leg swelling.  Gastrointestinal: Negative for constipation and diarrhea.  Endocrine: Negative for cold  intolerance and heat intolerance.  Genitourinary: Negative for difficulty urinating.  Musculoskeletal: Negative for joint swelling.  Skin: Negative for rash.  Allergic/Immunologic: Negative for food allergies.  Neurological: Negative for weakness.  Hematological: Does not bruise/bleed easily.  Psychiatric/Behavioral: Negative for sleep disturbance.     Objective: Vital Signs: There were no vitals taken for this visit.  Physical Exam Constitutional:      Appearance: She is well-developed.  Eyes:     Pupils: Pupils are equal, round, and reactive to light.  Pulmonary:     Effort: Pulmonary effort is normal.  Skin:    General: Skin is warm and dry.  Neurological:     Mental Status: She is alert and oriented to person, place, and time.  Psychiatric:        Behavior: Behavior normal.     Ortho Exam  Does have a boggy synovitis.  Questionable fluid but no fluid was obtained with attempted aspiration  Specialty Comments:  No specialty comments available.  Imaging: No results found.   PMFS History: Current Outpatient Medications  Medication Sig Dispense Refill  . atorvastatin (LIPITOR) 10 MG tablet TAKE 1 TABLET(10 MG) BY MOUTH DAILY 90 tablet 0  . cetirizine-pseudoephedrine (ZYRTEC-D) 5-120 MG per tablet Take 1 tablet by mouth daily as needed. Reported on 01/04/2016    . cholecalciferol (VITAMIN D) 1000 units tablet Take 1,000 Units by mouth daily.    . COMBIGAN 0.2-0.5 % ophthalmic  solution PLACE 1 DROP INTO BOTH EYES BID  3  . desonide (DESOWEN) 0.05 % cream desonide 0.05 % topical cream    . diazepam (VALIUM) 5 MG tablet Take 1 tablet 30 minutes before scan, then if needed, take 1 tablet prior to scan for MRI  anxiety. 2 tablet 0  . diclofenac sodium (VOLTAREN) 1 % GEL Apply 2 g topically 4 (four) times daily. 3 Tube 1  . latanoprost (XALATAN) 0.005 % ophthalmic solution Place 1 drop into both eyes at bedtime.    Marland Kitchen LUMIGAN 0.01 % SOLN INSTILL 1 DROP INTO BOTH EYES QHS  3   . Multiple Vitamins-Minerals (MULTIVITAMIN WITH MINERALS) tablet Take 1 tablet by mouth daily.    Marland Kitchen neomycin-polymyxin b-dexamethasone (MAXITROL) 3.5-10000-0.1 SUSP Use 1 drop 4x a day in the right eye for 2 weeks then 1 drop 2x a day for 2 weeks then stop.    Marland Kitchen neomycin-polymyxin b-dexamethasone (MAXITROL) 3.5-10000-0.1 SUSP     . olmesartan-hydrochlorothiazide (BENICAR HCT) 40-12.5 MG tablet Take 1 tablet by mouth daily. 90 tablet 3  . sulfamethoxazole-trimethoprim (BACTRIM DS,SEPTRA DS) 800-160 MG tablet Take 1 tablet by mouth 2 (two) times daily. 20 tablet 0  . venlafaxine XR (EFFEXOR-XR) 150 MG 24 hr capsule TAKE 1 CAPSULE(150 MG) BY MOUTH DAILY WITH BREAKFAST 90 capsule 3  . venlafaxine XR (EFFEXOR-XR) 150 MG 24 hr capsule TAKE 1 CAPSULE(150 MG) BY MOUTH DAILY WITH BREAKFAST 90 capsule 1  . vitamin B-12 (CYANOCOBALAMIN) 1000 MCG tablet Take 1,000 mcg by mouth daily.    . vitamin C (ASCORBIC ACID) 500 MG tablet Take 500 mg by mouth daily.     No current facility-administered medications for this visit.     Patient Active Problem List   Diagnosis Date Noted  . Bilateral primary osteoarthritis of knee 01/18/2019  . Acute pain of left knee 01/18/2019  . Pain in right elbow 10/18/2018  . Cotton wool spots 09/29/2018  . Epidermoid cyst of skin 03/17/2018  . Menopause present 03/17/2018  . Essential hypertension 02/10/2018  . Neck pain 05/29/2017  . Other seasonal allergic rhinitis 11/15/2015  . Family history of breast cancer in mother 09/06/2014  . Dysthymia 09/06/2014  . Glaucoma 09/06/2014  . Hyperlipidemia LDL goal <100 09/06/2014  . S/P TKR (total knee replacement) 08/12/2012  . Family history of heart disease in female family member before age 12 07/22/2012   Past Medical History:  Diagnosis Date  . Allergy    RHINITIS  . Arthritis   . Depression   . Glaucoma   . Hyperlipidemia   . Hypertension   . Osteopenia   . TMJ syndrome     Family History  Problem Relation Age  of Onset  . Heart disease Mother        valve replacement  . Cancer Mother        breast, throat  . Heart disease Father        died of brain embolism after hip surgery  . Arthritis Father   . Arthritis Brother   . Cancer Maternal Aunt        various cancers  . Diabetes Neg Hx   . Hypertension Neg Hx   . Stroke Neg Hx     Past Surgical History:  Procedure Laterality Date  . BIOPSY BREAST    . BLEPHAROPLASTY    . KNEE ARTHROSCOPY     right knee, 1990 and 2012, Dr. Durward Fortes  . TEMPOROMANDIBULAR JOINT SURGERY    . TONSILLECTOMY    .  TOTAL KNEE ARTHROPLASTY  08/10/2012   Procedure: TOTAL KNEE ARTHROPLASTY;  Surgeon: Garald Balding, MD;  Location: [facility];  Service: Orthopedics;  Laterality: Right;  Right Total Knee Arthroplasty   Social History   Occupational History  . Occupation: attorney    Employer: [employer]  Tobacco Use  . Smoking status: Former Smoker    Last attempt to quit: 02/10/1980    Years since quitting: 39.1  . Smokeless tobacco: Never Used  Substance and Sexual Activity  . Alcohol use: Yes    Alcohol/week: 1.0 standard drinks    Types: 1 Glasses of wine per week  . Drug use: No  . Sexual activity: Yes

## 2019-04-12 ENCOUNTER — Telehealth: Payer: Self-pay | Admitting: Orthopaedic Surgery

## 2019-04-12 NOTE — Telephone Encounter (Signed)
If she wants, Dr Durward Fortes said she could do a cortisone injection.Jodi KitchenMarland Duffy

## 2019-04-12 NOTE — Telephone Encounter (Signed)
Spoke with patient. She will come in Thursday AM for appt with Dr.Whitfield.

## 2019-04-12 NOTE — Telephone Encounter (Signed)
Patient got last gel injection 2 weeks ago. Per patient, it does not seem to be helping. Patient has a rov scheduled on June 10th. Patient wants to know if she needs to wait that long to come back? Please call to advise.

## 2019-04-12 NOTE — Telephone Encounter (Signed)
Please advise 

## 2019-04-14 ENCOUNTER — Other Ambulatory Visit: Payer: Self-pay

## 2019-04-14 ENCOUNTER — Ambulatory Visit (INDEPENDENT_AMBULATORY_CARE_PROVIDER_SITE_OTHER): Payer: Medicare Other | Admitting: Orthopaedic Surgery

## 2019-04-14 ENCOUNTER — Encounter: Payer: Self-pay | Admitting: Orthopaedic Surgery

## 2019-04-14 ENCOUNTER — Other Ambulatory Visit: Payer: Self-pay | Admitting: Family Medicine

## 2019-04-14 VITALS — BP 128/80 | HR 78 | Ht 63.0 in | Wt 148.0 lb

## 2019-04-14 DIAGNOSIS — M170 Bilateral primary osteoarthritis of knee: Secondary | ICD-10-CM

## 2019-04-14 DIAGNOSIS — E785 Hyperlipidemia, unspecified: Secondary | ICD-10-CM

## 2019-04-14 DIAGNOSIS — F341 Dysthymic disorder: Secondary | ICD-10-CM

## 2019-04-14 DIAGNOSIS — I10 Essential (primary) hypertension: Secondary | ICD-10-CM

## 2019-04-14 DIAGNOSIS — Z8249 Family history of ischemic heart disease and other diseases of the circulatory system: Secondary | ICD-10-CM

## 2019-04-14 MED ORDER — LIDOCAINE HCL 1 % IJ SOLN
2.0000 mL | INTRAMUSCULAR | Status: AC | PRN
  Administered 2019-04-14: 2 mL

## 2019-04-14 MED ORDER — BUPIVACAINE HCL 0.5 % IJ SOLN
2.0000 mL | INTRAMUSCULAR | Status: AC | PRN
  Administered 2019-04-14: 2 mL via INTRA_ARTICULAR

## 2019-04-14 MED ORDER — METHYLPREDNISOLONE ACETATE 40 MG/ML IJ SUSP
80.0000 mg | INTRAMUSCULAR | Status: AC | PRN
  Administered 2019-04-14: 80 mg via INTRA_ARTICULAR

## 2019-04-14 NOTE — Progress Notes (Signed)
Office Visit Note   Patient: Jodi Duffy           Date of Birth: 1950/12/20           MRN: [id number] Visit Date: 04/14/2019              Requested by: Jodi Lung, MD [place], [place] [address] PCP: Jodi Lung, MD   Assessment & Plan: Visit Diagnoses:  1. Bilateral primary osteoarthritis of knee     Plan: Jodi Duffy has not had a good response to the Visco supplementation in her left knee.  Long discussion regarding treatment options over time.  She has tried the hemp oil and over-the-counter medicines.  We will try her cortisone injection.  She is aware that definitive treatment would be knee replacement  Follow-Up Instructions: Return if symptoms worsen or fail to improve.   Orders:  Orders Placed This Encounter  Procedures  . Large Joint Inj: L knee   No orders of the defined types were placed in this encounter.     Procedures: Large Joint Inj: L knee on 04/14/2019 8:27 AM Indications: pain and diagnostic evaluation Details: 25 G 1.5 in needle, anteromedial approach  Arthrogram: No  Medications: 2 mL lidocaine 1 %; 2 mL bupivacaine 0.5 %; 80 mg methylPREDNISolone acetate 40 MG/ML Procedure, treatment alternatives, risks and benefits explained, specific risks discussed. Consent was given by the patient. Patient was prepped and draped in the usual sterile fashion.       Clinical Data: No additional findings.   Subjective: Chief Complaint  Patient presents with  . Left Knee - Follow-up  Patient presents today for her left knee. She finished orthovisc in her left knee on 03/29/19. She said that she is continuing to hurt in her knee since the injections. She said that there may be slight improvement. She takes Aleve as needed.   HPI  Review of Systems  Constitutional: Negative for fatigue.  HENT: Negative for ear pain.   Eyes: Negative for pain.  Respiratory: Negative for shortness of breath.   Cardiovascular: Negative for leg  swelling.  Gastrointestinal: Negative for constipation and diarrhea.  Endocrine: Negative for cold intolerance and heat intolerance.  Genitourinary: Negative for difficulty urinating.  Musculoskeletal: Positive for joint swelling.  Skin: Negative for rash.  Allergic/Immunologic: Negative for food allergies.  Neurological: Negative for weakness.  Hematological: Does not bruise/bleed easily.  Psychiatric/Behavioral: Negative for sleep disturbance.     Objective: Vital Signs: BP 128/80   Pulse 78   Ht 5\' 3"  (1.6 m)   Wt 148 lb (67.1 kg)   BMI 26.22 kg/m   Physical Exam  Ortho Exam left knee without effusion.  There is diffuse lateral joint tenderness.  Some patellar crepitation.  Slight valgus with weightbearing full extension flexed over 105 degrees without instability Specialty Comments:  No specialty comments available.  Imaging: No results found.   PMFS History: Patient Active Problem List   Diagnosis Date Noted  . Bilateral primary osteoarthritis of knee 01/18/2019  . Acute pain of left knee 01/18/2019  . Pain in right elbow 10/18/2018  . Cotton wool spots 09/29/2018  . Epidermoid cyst of skin 03/17/2018  . Menopause present 03/17/2018  . Essential hypertension 02/10/2018  . Neck pain 05/29/2017  . Other seasonal allergic rhinitis 11/15/2015  . Family history of breast cancer in mother 09/06/2014  . Dysthymia 09/06/2014  . Glaucoma 09/06/2014  . Hyperlipidemia LDL goal <100 09/06/2014  . S/P TKR (total knee  replacement) 08/12/2012  . Family history of heart disease in female family member before age 44 07/22/2012   Past Medical History:  Diagnosis Date  . Allergy    RHINITIS  . Arthritis   . Depression   . Glaucoma   . Hyperlipidemia   . Hypertension   . Osteopenia   . TMJ syndrome     Family History  Problem Relation Age of Onset  . Heart disease Mother        valve replacement  . Cancer Mother        breast, throat  . Heart disease Father         died of brain embolism after hip surgery  . Arthritis Father   . Arthritis Brother   . Cancer Maternal Aunt        various cancers  . Diabetes Neg Hx   . Hypertension Neg Hx   . Stroke Neg Hx     Past Surgical History:  Procedure Laterality Date  . BIOPSY BREAST    . BLEPHAROPLASTY    . KNEE ARTHROSCOPY     right knee, 1990 and 2012, Dr. Durward Duffy  . TEMPOROMANDIBULAR JOINT SURGERY    . TONSILLECTOMY    . TOTAL KNEE ARTHROPLASTY  08/10/2012   Procedure: TOTAL KNEE ARTHROPLASTY;  Surgeon: Jodi Balding, MD;  Location: [facility];  Service: Orthopedics;  Laterality: Right;  Right Total Knee Arthroplasty   Social History   Occupational History  . Occupation: attorney    Employer: [employer]  Tobacco Use  . Smoking status: Former Smoker    Last attempt to quit: 02/10/1980    Years since quitting: 39.2  . Smokeless tobacco: Never Used  Substance and Sexual Activity  . Alcohol use: Yes    Alcohol/week: 1.0 standard drinks    Types: 1 Glasses of wine per week  . Drug use: No  . Sexual activity: Yes

## 2019-04-14 NOTE — Telephone Encounter (Signed)
[facility] is requesting to fill pt effexor. Please advise [facility]

## 2019-04-21 ENCOUNTER — Telehealth: Payer: Self-pay | Admitting: Orthopaedic Surgery

## 2019-04-21 NOTE — Telephone Encounter (Signed)
Patient is ready to schedule her lt knee replacement.

## 2019-04-21 NOTE — Telephone Encounter (Signed)
Per our discussion complete the surgery form-needs to wait 6 weeks from last injection or about mid July. I will be happy to call if you wish

## 2019-04-21 NOTE — Telephone Encounter (Signed)
Please see below.

## 2019-04-22 NOTE — Telephone Encounter (Signed)
Called and left message on machine for patient. Relayed information below.

## 2019-04-27 ENCOUNTER — Ambulatory Visit: Payer: Medicare Other | Admitting: Orthopaedic Surgery

## 2019-05-04 ENCOUNTER — Other Ambulatory Visit: Payer: Self-pay

## 2019-05-05 ENCOUNTER — Other Ambulatory Visit: Payer: Self-pay | Admitting: Orthopedic Surgery

## 2019-05-12 NOTE — Progress Notes (Signed)
Jodi Duffy was seen today in the movement disorders clinic for follow-up of asymmetric essential tremor.  Wanted to follow her because she had pseudobulbar speech and some lack of arm swing, but otherwise did not meet criteria for parkinsonism.  Reports that "my tremor is there but it is fine."  It is not bad enough to take medications.    Records have been reviewed since last visit.  Have been following with orthopedics for knee injections on the left.  She is going to have surgery in 4 weeks.  No falls.  No swallowing trouble.  No diplopia.    PREVIOUS MEDICATIONS: none to date  ALLERGIES:  No Known Allergies  CURRENT MEDICATIONS:  Outpatient Encounter Medications as of 05/16/2019  Medication Sig  . atorvastatin (LIPITOR) 10 MG tablet TAKE 1 TABLET(10 MG) BY MOUTH DAILY  . cholecalciferol (VITAMIN D) 1000 units tablet Take 1,000 Units by mouth daily.  . COMBIGAN 0.2-0.5 % ophthalmic solution PLACE 1 DROP INTO BOTH EYES BID  . desonide (DESOWEN) 0.05 % cream desonide 0.05 % topical cream  . diazepam (VALIUM) 5 MG tablet Take 1 tablet 30 minutes before scan, then if needed, take 1 tablet prior to scan for MRI  anxiety.  Marland Kitchen latanoprost (XALATAN) 0.005 % ophthalmic solution Place 1 drop into both eyes at bedtime.  Marland Kitchen LUMIGAN 0.01 % SOLN INSTILL 1 DROP INTO BOTH EYES QHS  . Multiple Vitamins-Minerals (MULTIVITAMIN WITH MINERALS) tablet Take 1 tablet by mouth daily.  Marland Kitchen venlafaxine XR (EFFEXOR-XR) 150 MG 24 hr capsule TAKE 1 CAPSULE(150 MG) BY MOUTH DAILY WITH BREAKFAST  . vitamin B-12 (CYANOCOBALAMIN) 1000 MCG tablet Take 1,000 mcg by mouth daily.  . vitamin C (ASCORBIC ACID) 500 MG tablet Take 500 mg by mouth daily.  . [DISCONTINUED] cetirizine-pseudoephedrine (ZYRTEC-D) 5-120 MG per tablet Take 1 tablet by mouth daily as needed. Reported on 01/04/2016  . [DISCONTINUED] diclofenac sodium (VOLTAREN) 1 % GEL Apply 2 g topically 4 (four) times daily. (Patient not taking: Reported on 05/16/2019)   . [DISCONTINUED] neomycin-polymyxin b-dexamethasone (MAXITROL) 3.5-10000-0.1 SUSP Use 1 drop 4x a day in the right eye for 2 weeks then 1 drop 2x a day for 2 weeks then stop.  . [DISCONTINUED] neomycin-polymyxin b-dexamethasone (MAXITROL) 3.5-10000-0.1 SUSP   . [DISCONTINUED] olmesartan-hydrochlorothiazide (BENICAR HCT) 40-12.5 MG tablet TAKE 1 TABLET BY MOUTH DAILY (Patient not taking: Reported on 05/16/2019)  . [DISCONTINUED] sulfamethoxazole-trimethoprim (BACTRIM DS,SEPTRA DS) 800-160 MG tablet Take 1 tablet by mouth 2 (two) times daily. (Patient not taking: Reported on 05/16/2019)   No facility-administered encounter medications on file as of 05/16/2019.     PAST MEDICAL HISTORY:   Past Medical History:  Diagnosis Date  . Allergy    RHINITIS  . Arthritis   . Depression   . Glaucoma   . Hyperlipidemia   . Hypertension   . Osteopenia   . TMJ syndrome     PAST SURGICAL HISTORY:   Past Surgical History:  Procedure Laterality Date  . BIOPSY BREAST    . BLEPHAROPLASTY    . KNEE ARTHROSCOPY     right knee, 1990 and 2012, Dr. Durward Fortes  . TEMPOROMANDIBULAR JOINT SURGERY    . TONSILLECTOMY    . TOTAL KNEE ARTHROPLASTY  08/10/2012   Procedure: TOTAL KNEE ARTHROPLASTY;  Surgeon: Garald Balding, MD;  Location: [place];  Service: Orthopedics;  Laterality: Right;  Right Total Knee Arthroplasty    SOCIAL HISTORY:   Social History   Socioeconomic History  . Marital status:  Married    Spouse name: Not on file  . Number of children: Not on file  . Years of education: Not on file  . Highest education level: Professional school degree (e.g., MD, DDS, DVM, JD)  Occupational History  . Occupation: attorney    Employer: [employer]  Social Needs  . Financial resource strain: Not on file  . Food insecurity    Worry: Not on file    Inability: Not on file  . Transportation needs    Medical: Not on file    Non-medical: Not on file  Tobacco Use  . Smoking status: Former  Smoker    Quit date: 02/10/1980    Years since quitting: 39.2  . Smokeless tobacco: Never Used  Substance and Sexual Activity  . Alcohol use: Yes    Alcohol/week: 1.0 standard drinks    Types: 1 Glasses of wine per week  . Drug use: No  . Sexual activity: Yes  Lifestyle  . Physical activity    Days per week: Not on file    Minutes per session: Not on file  . Stress: Not on file  Relationships  . Social Herbalist on phone: Not on file    Gets together: Not on file    Attends religious service: Not on file    Active member of club or organization: Not on file    Attends meetings of clubs or organizations: Not on file    Relationship status: Not on file  . Intimate partner violence    Fear of current or ex partner: Not on file    Emotionally abused: Not on file    Physically abused: Not on file    Forced sexual activity: Not on file  Other Topics Concern  . Not on file  Social History Narrative   Exercises with pilates, golf, and has a trainer    FAMILY HISTORY:   Family Status  Relation Name Status  . Mother  Deceased       11-Mar-1987  . Father  Deceased       07/13/1975  . Brother  Alive  . Mat Aunt  (Not Specified)  . Child 2 Alive  . Neg Hx  (Not Specified)    ROS:  Review of Systems  Constitutional: Negative.   HENT: Negative.   Eyes: Negative.   Respiratory: Negative.   Cardiovascular: Negative.   Musculoskeletal: Positive for joint pain (left knee).  Skin: Negative.     PHYSICAL EXAMINATION:    VITALS:   Vitals:   05/16/19 0840  BP: (!) 148/82  Pulse: 80  Temp: 98.5 F (36.9 C)  SpO2: 96%  Weight: 151 lb 9.6 oz (68.8 kg)  Height: 5\' 3"  (1.6 m)    GEN:  The patient appears stated age and is in NAD. HEENT:  Normocephalic.  Atraumatic. CV:  RRR Lungs:  CTAB Neck/HEME:  There are no carotid bruits bilaterally.  Neurological examination:  Orientation: The patient is alert and oriented x3.  Cranial nerves: There is good facial  symmetry.  Extraocular muscles are intact.  The speech is fluent and clear but did not notice the pseudobulbar quality that I noticed last visit.. Soft palate rises symmetrically and there is no tongue deviation. Hearing is intact to conversational tone. Sensation: Sensation is intact to light touch throughout. Motor: Strength is at least antigravity x4.   Movement examination: Tone: There is normal tone in the bilateral upper extremities.  The tone in the  lower extremities is normal.  Abnormal movements: There is just occasional rest tremor when she is focused on something else in the right hand (only noticed when she was drawing with the left hand).  She has no tremor of the outstretched hands until she is given a weight and then there is just a mild tremor in the right hand.  She really has no tremor with Archimedes spirals. Coordination:  There is no decremation with RAM's, with any form of RAMS, including alternating supination and pronation of the forearm, hand opening and closing, finger taps, heel taps and toe taps. Gait and Station: The patient has no difficulty arising out of a deep-seated chair without the use of the hands. The gait is antalgic.  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  Labs:   Chemistry      Component Value Date/Time   NA 143 02/10/2018 1053   K 4.8 02/10/2018 1053   CL 104 02/10/2018 1053   CO2 23 02/10/2018 1053   BUN 15 02/10/2018 1053   CREATININE 0.60 02/10/2018 1053   CREATININE 0.58 02/09/2017 0847      Component Value Date/Time   CALCIUM 9.3 02/10/2018 1053   ALKPHOS 68 02/10/2018 1053   AST 16 02/10/2018 1053   ALT 19 02/10/2018 1053   BILITOT 0.2 02/10/2018 1053     Lab Results  Component Value Date   TSH 2.08 05/13/2018   No results found for: VITAMINB12   ASSESSMENT/PLAN:  1.  Probable assymetric ET  -Looks stable compared to 1 year ago.  She does not want any medication for essential tremor.  Really does not look like she has an atypical  state.  Talked to her about symptoms of an atypical state and symptoms that should warrant a follow-up with me, including falls, trouble swallowing, double vision or if she should want treatment for tremor.  Otherwise, she will see me back on an as-needed basis.   Cc:  Denita Lung, MD

## 2019-05-16 ENCOUNTER — Other Ambulatory Visit: Payer: Self-pay

## 2019-05-16 ENCOUNTER — Encounter: Payer: Self-pay | Admitting: Neurology

## 2019-05-16 ENCOUNTER — Telehealth: Payer: Self-pay

## 2019-05-16 ENCOUNTER — Ambulatory Visit (INDEPENDENT_AMBULATORY_CARE_PROVIDER_SITE_OTHER): Payer: Medicare Other | Admitting: Neurology

## 2019-05-16 VITALS — BP 148/82 | HR 80 | Temp 98.5°F | Ht 63.0 in | Wt 151.6 lb

## 2019-05-16 DIAGNOSIS — G250 Essential tremor: Secondary | ICD-10-CM | POA: Diagnosis not present

## 2019-05-16 NOTE — Telephone Encounter (Signed)
Pt called and stated she is having surgery on 06/14/19 and she has an appointment here 06/07/19. She thinks she'll need to stop her Effexor before 06/07/19 because is she miss a pill she has nightmare. She would like your opinion on what's the best thing to do. Please advise.

## 2019-05-19 ENCOUNTER — Encounter: Payer: Self-pay | Admitting: Family Medicine

## 2019-05-23 ENCOUNTER — Telehealth: Payer: Self-pay | Admitting: Medical

## 2019-05-23 NOTE — Telephone Encounter (Signed)
This got routed to me, appears to be Dr. Lanice Shirts patient.

## 2019-05-24 ENCOUNTER — Other Ambulatory Visit: Payer: Self-pay

## 2019-05-24 ENCOUNTER — Encounter: Payer: Self-pay | Admitting: Family Medicine

## 2019-05-24 ENCOUNTER — Ambulatory Visit (INDEPENDENT_AMBULATORY_CARE_PROVIDER_SITE_OTHER): Payer: Medicare Other | Admitting: Family Medicine

## 2019-05-24 VITALS — BP 128/82 | HR 82 | Temp 97.8°F | Wt 150.4 lb

## 2019-05-24 DIAGNOSIS — N3000 Acute cystitis without hematuria: Secondary | ICD-10-CM | POA: Diagnosis not present

## 2019-05-24 LAB — POCT URINALYSIS DIP (PROADVANTAGE DEVICE)
Bilirubin, UA: NEGATIVE
Blood, UA: NEGATIVE
Glucose, UA: NEGATIVE mg/dL
Nitrite, UA: POSITIVE — AB
Protein Ur, POC: NEGATIVE mg/dL
Specific Gravity, Urine: 1.015
Urobilinogen, Ur: 0.2
pH, UA: 6 (ref 5.0–8.0)

## 2019-05-24 MED ORDER — SULFAMETHOXAZOLE-TRIMETHOPRIM 800-160 MG PO TABS: 1.0000 | ORAL_TABLET | Freq: Two times a day (BID) | ORAL | 0 refills | Status: DC

## 2019-05-24 NOTE — Progress Notes (Signed)
   Subjective:    Patient ID: Jodi Duffy, female    DOB: 1951-11-02, 68 y.o.   MRN: [id number]  HPI She complains of difficulty with foul-smelling urine as well as frequency but no fever or chills or abdominal discomfort.  Review of Systems     Objective:   Physical Exam Alert and in no distress.  Urine dipstick is positive for nitrites and white cells.      Assessment & Plan:  Acute cystitis without hematuria - Plan: sulfamethoxazole-trimethoprim (BACTRIM DS) 800-160 MG tablet, she will call if continued difficulty.

## 2019-05-24 NOTE — Telephone Encounter (Signed)
Jodi Duffy coming in today. Jodi Duffy

## 2019-05-24 NOTE — Addendum Note (Signed)
Addended by: Elyse Jarvis on: 05/24/2019 04:42 PM   Modules accepted: Orders

## 2019-05-26 ENCOUNTER — Ambulatory Visit: Payer: Self-pay | Admitting: Family Medicine

## 2019-05-26 ENCOUNTER — Ambulatory Visit: Payer: Medicare Other | Admitting: Family Medicine

## 2019-06-06 ENCOUNTER — Telehealth: Payer: Self-pay

## 2019-06-06 NOTE — H&P (Signed)
TOTAL KNEE ADMISSION H&P  Patient is being admitted for left total knee arthroplasty.  Subjective:  Chief Complaint:left knee pain.  HPI: Jodi Duffy, 68 y.o. female, has a history of pain and functional disability in the left knee due to arthritis and has failed non-surgical conservative treatments for greater than 12 weeks to includeNSAID's and/or analgesics, corticosteriod injections, viscosupplementation injections, flexibility and strengthening excercises and activity modification.  Onset of symptoms was gradual, starting 2 years ago with gradually worsening course since that time. The patient noted no past surgery on the left knee(s).  Patient currently rates pain in the left knee(s) at 8 out of 10 with activity. Patient has night pain, worsening of pain with activity and weight bearing, pain that interferes with activities of daily living, crepitus and joint swelling.  Patient has evidence of subchondral cysts, subchondral sclerosis, periarticular osteophytes and joint space narrowing by imaging studies.  There is no active infection.  Patient Active Problem List   Diagnosis Date Noted  . Bilateral primary osteoarthritis of knee 01/18/2019  . Acute pain of left knee 01/18/2019  . Pain in right elbow 10/18/2018  . Cotton wool spots 09/29/2018  . Epidermoid cyst of skin 03/17/2018  . Menopause present 03/17/2018  . Essential hypertension 02/10/2018  . Neck pain 05/29/2017  . Other seasonal allergic rhinitis 11/15/2015  . Family history of breast cancer in mother 09/06/2014  . Dysthymia 09/06/2014  . Glaucoma 09/06/2014  . Hyperlipidemia LDL goal <100 09/06/2014  . S/P TKR (total knee replacement) 08/12/2012  . Family history of heart disease in female family member before age 35 07/22/2012   Past Medical History:  Diagnosis Date  . Allergy    RHINITIS  . Arthritis   . Depression   . Glaucoma   . Hyperlipidemia   . Hypertension   . Osteopenia   . TMJ syndrome     Past  Surgical History:  Procedure Laterality Date  . BIOPSY BREAST    . BLEPHAROPLASTY    . KNEE ARTHROSCOPY     right knee, 1990 and 2012, Dr. Durward Fortes  . TEMPOROMANDIBULAR JOINT SURGERY    . TONSILLECTOMY    . TOTAL KNEE ARTHROPLASTY  08/10/2012   Procedure: TOTAL KNEE ARTHROPLASTY;  Surgeon: Garald Balding, MD;  Location: [facility];  Service: Orthopedics;  Laterality: Right;  Right Total Knee Arthroplasty    No current facility-administered medications for this encounter.    Current Outpatient Medications  Medication Sig Dispense Refill Last Dose  . atorvastatin (LIPITOR) 10 MG tablet TAKE 1 TABLET(10 MG) BY MOUTH DAILY 90 tablet 0 Taking  . cholecalciferol (VITAMIN D) 1000 units tablet Take 1,000 Units by mouth daily.   Taking  . COMBIGAN 0.2-0.5 % ophthalmic solution PLACE 1 DROP INTO BOTH EYES BID  3 Taking  . desonide (DESOWEN) 0.05 % cream desonide 0.05 % topical cream   Not Taking  . diazepam (VALIUM) 5 MG tablet Take 1 tablet 30 minutes before scan, then if needed, take 1 tablet prior to scan for MRI  anxiety. (Patient not taking: Reported on 05/24/2019) 2 tablet 0 Not Taking  . latanoprost (XALATAN) 0.005 % ophthalmic solution Place 1 drop into both eyes at bedtime.   Not Taking  . LUMIGAN 0.01 % SOLN INSTILL 1 DROP INTO BOTH EYES QHS  3 Taking  . Multiple Vitamins-Minerals (MULTIVITAMIN WITH MINERALS) tablet Take 1 tablet by mouth daily.   Taking  . sulfamethoxazole-trimethoprim (BACTRIM DS) 800-160 MG tablet Take 1 tablet by mouth 2 (  two) times daily. 20 tablet 0   . venlafaxine XR (EFFEXOR-XR) 150 MG 24 hr capsule TAKE 1 CAPSULE(150 MG) BY MOUTH DAILY WITH BREAKFAST 90 capsule 0 Taking  . vitamin B-12 (CYANOCOBALAMIN) 1000 MCG tablet Take 1,000 mcg by mouth daily.   Not Taking  . vitamin C (ASCORBIC ACID) 500 MG tablet Take 500 mg by mouth daily.   Taking   No Known Allergies  Social History   Tobacco Use  . Smoking status: Former Smoker    Quit date: 02/10/1980    Years since  quitting: 39.3  . Smokeless tobacco: Never Used  Substance Use Topics  . Alcohol use: Yes    Alcohol/week: 1.0 standard drinks    Types: 1 Glasses of wine per week    Family History  Problem Relation Age of Onset  . Heart disease Mother        valve replacement  . Cancer Mother        breast, throat  . Heart disease Father        died of brain embolism after hip surgery  . Arthritis Father   . Arthritis Brother   . Cancer Maternal Aunt        various cancers  . Diabetes Neg Hx   . Hypertension Neg Hx   . Stroke Neg Hx      Review of Systems  Neurological: Negative for tremors and headaches.    Objective:  Physical Exam  Constitutional: She is oriented to person, place, and time. She appears well-developed and well-nourished.  HENT:  Head: Normocephalic and atraumatic.  Eyes: Pupils are equal, round, and reactive to light. Conjunctivae and EOM are normal.  Neck: Normal range of motion. Neck supple. No thyromegaly present.  Cardiovascular: Normal rate, regular rhythm, normal heart sounds and intact distal pulses.  No murmur heard. Respiratory: Effort normal and breath sounds normal. She has no wheezes.  GI: Soft. Bowel sounds are normal. She exhibits no mass. There is no abdominal tenderness.  Neurological: She is alert and oriented to person, place, and time.  Skin: Skin is dry.  Psychiatric: She has a normal mood and affect. Her behavior is normal. Judgment and thought content normal.  Musculoskeletal:  ROM 3-105 left knee. Trace effusion.  No warmth or erythema  Vital signs in last 24 hours: Temp:  [98.1 F (36.7 C)] 98.1 F (36.7 C) (07/21 0816) Pulse Rate:  [73] 73 (07/21 0816) Resp:  [14] 14 (07/21 0816) BP: (136)/(87) 136/87 (07/21 0816) Weight:  [67.6 kg] 67.6 kg (07/21 0816)    Estimated body mass index is 26.39 kg/m as calculated from the following:   Height as of 06/07/19: 5\' 3"  (1.6 m).   Weight as of 06/07/19: 67.6 kg.   Imaging Review Plain  radiographs demonstrate moderate degenerative joint disease of the left knee(s). The overall alignment ismild valgus. The bone quality appears to be good for age and reported activity level.      Assessment/Plan:  End stage arthritis, left knee   The patient history, physical examination, clinical judgment of the provider and imaging studies are consistent with end stage degenerative joint disease of the left knee(s) and total knee arthroplasty is deemed medically necessary. The treatment options including medical management, injection therapy arthroscopy and arthroplasty were discussed at length. The risks and benefits of total knee arthroplasty were presented and reviewed. The risks due to aseptic loosening, infection, stiffness, patella tracking problems, thromboembolic complications and other imponderables were discussed. The patient acknowledged the explanation,  agreed to proceed with the plan and consent was signed. Patient is being admitted for inpatient treatment for surgery, pain control, PT, OT, prophylactic antibiotics, VTE prophylaxis, progressive ambulation and ADL's and discharge planning. The patient is planning to be discharged home with home health services     Patient's anticipated LOS is less than 2 midnights, meeting these requirements: - Younger than 79 - Lives within 1 hour of care - Has a competent adult at home to recover with post-op recover - NO history of  - Chronic pain requiring opiods  - Diabetes  - Coronary Artery Disease  - Heart failure  - Heart attack  - Stroke  - DVT/VTE  - Cardiac arrhythmia  - Respiratory Failure/COPD  - Renal failure  - Anemia  - Advanced Liver disease

## 2019-06-06 NOTE — Telephone Encounter (Signed)
Done and called the office to advise info was sent over. Kh

## 2019-06-06 NOTE — Telephone Encounter (Signed)
Pt is being seen at [facility] hearing [facility]. ATTNMichelene Heady for hearing loss evaluation. Please fax referral notes to [phone]. Pt stated this has to be sent today or they will cancel her appointment.

## 2019-06-07 ENCOUNTER — Other Ambulatory Visit: Payer: Self-pay

## 2019-06-07 ENCOUNTER — Encounter: Payer: Self-pay | Admitting: Orthopedic Surgery

## 2019-06-07 ENCOUNTER — Encounter: Payer: Medicare Other | Admitting: Family Medicine

## 2019-06-07 ENCOUNTER — Ambulatory Visit (INDEPENDENT_AMBULATORY_CARE_PROVIDER_SITE_OTHER): Payer: Medicare Other | Admitting: Orthopedic Surgery

## 2019-06-07 VITALS — BP 136/87 | HR 73 | Temp 98.1°F | Resp 14 | Ht 63.0 in | Wt 149.0 lb

## 2019-06-07 DIAGNOSIS — M1712 Unilateral primary osteoarthritis, left knee: Secondary | ICD-10-CM | POA: Diagnosis not present

## 2019-06-07 DIAGNOSIS — Z96651 Presence of right artificial knee joint: Secondary | ICD-10-CM

## 2019-06-07 NOTE — Care Plan (Signed)
RNCM met with patient while in office today for her H & P with Biagio Borg, PA-C, to review the Ortho Bundle program. Patient is scheduled for surgery on 06/14/2019 for a Left total knee replacement. She has a spouse that will be available to help at home after discharge from the hospital. Home CPM has been ordered through [facility]. Patient has all other DME needed post-surgery. Anticipate 1 night hospital stay with discharge home with home health PT. 2 week follow up appointment scheduled for 06/28/2019 with Dr. Durward Fortes. Reviewed Ortho Bundle program. Reviewed post-operative instructions and patient was able to ask questions. Will continue to follow post-surgery for any other case management needs.

## 2019-06-07 NOTE — Progress Notes (Signed)
Chief Complaint:left knee pain.  HPI: Jodi Duffy, 68 y.o. female, has a history of pain and functional disability in the left knee due to arthritis and has failed non-surgical conservative treatments for greater than 12 weeks to includeNSAID's and/or analgesics, corticosteriod injections, viscosupplementation injections, flexibility and strengthening excercises and activity modification.  Onset of symptoms was gradual, starting 2 years ago with gradually worsening course since that time. The patient noted no past surgery on the left knee(s).  Patient currently rates pain in the left knee(s) at 8 out of 10 with activity. Patient has night pain, worsening of pain with activity and weight bearing, pain that interferes with activities of daily living, crepitus and joint swelling.  Patient has evidence of subchondral cysts, subchondral sclerosis, periarticular osteophytes and joint space narrowing by imaging studies.  There is no active infection.  Patient Active Problem List   Diagnosis Date Noted  . Bilateral primary osteoarthritis of knee 01/18/2019  . Acute pain of left knee 01/18/2019  . Pain in right elbow 10/18/2018  . Cotton wool spots 09/29/2018  . Epidermoid cyst of skin 03/17/2018  . Menopause present 03/17/2018  . Essential hypertension 02/10/2018  . Neck pain 05/29/2017  . Other seasonal allergic rhinitis 11/15/2015  . Family history of breast cancer in mother 09/06/2014  . Dysthymia 09/06/2014  . Glaucoma 09/06/2014  . Hyperlipidemia LDL goal <100 09/06/2014  . S/P TKR (total knee replacement) 08/12/2012  . Family history of heart disease in female family member before age 87 07/22/2012   Past Medical History:  Diagnosis Date  . Allergy    RHINITIS  . Arthritis   . Depression   . Glaucoma   . Hyperlipidemia   . Hypertension   . Osteopenia   . TMJ syndrome     Past Surgical History:  Procedure Laterality Date  . BIOPSY BREAST    . BLEPHAROPLASTY    . KNEE  ARTHROSCOPY     right knee, 1990 and 2012, Dr. Durward Fortes  . TEMPOROMANDIBULAR JOINT SURGERY    . TONSILLECTOMY    . TOTAL KNEE ARTHROPLASTY  08/10/2012   Procedure: TOTAL KNEE ARTHROPLASTY;  Surgeon: Garald Balding, MD;  Location: [facility];  Service: Orthopedics;  Laterality: Right;  Right Total Knee Arthroplasty    No current facility-administered medications for this encounter.    Current Outpatient Medications  Medication Sig Dispense Refill Last Dose  . atorvastatin (LIPITOR) 10 MG tablet TAKE 1 TABLET(10 MG) BY MOUTH DAILY 90 tablet 0 Taking  . cholecalciferol (VITAMIN D) 1000 units tablet Take 1,000 Units by mouth daily.   Taking  . COMBIGAN 0.2-0.5 % ophthalmic solution PLACE 1 DROP INTO BOTH EYES BID  3 Taking  . desonide (DESOWEN) 0.05 % cream desonide 0.05 % topical cream   Not Taking  . diazepam (VALIUM) 5 MG tablet Take 1 tablet 30 minutes before scan, then if needed, take 1 tablet prior to scan for MRI  anxiety. (Patient not taking: Reported on 05/24/2019) 2 tablet 0 Not Taking  . latanoprost (XALATAN) 0.005 % ophthalmic solution Place 1 drop into both eyes at bedtime.   Not Taking  . LUMIGAN 0.01 % SOLN INSTILL 1 DROP INTO BOTH EYES QHS  3 Taking  . Multiple Vitamins-Minerals (MULTIVITAMIN WITH MINERALS) tablet Take 1 tablet by mouth daily.   Taking  . sulfamethoxazole-trimethoprim (BACTRIM DS) 800-160 MG tablet Take 1 tablet by mouth 2 (two) times daily. 20 tablet 0   . venlafaxine XR (EFFEXOR-XR) 150 MG 24 hr capsule  TAKE 1 CAPSULE(150 MG) BY MOUTH DAILY WITH BREAKFAST 90 capsule 0 Taking  . vitamin B-12 (CYANOCOBALAMIN) 1000 MCG tablet Take 1,000 mcg by mouth daily.   Not Taking  . vitamin C (ASCORBIC ACID) 500 MG tablet Take 500 mg by mouth daily.   Taking   No Known Allergies  Social History   Tobacco Use  . Smoking status: Former Smoker    Quit date: 02/10/1980    Years since quitting: 39.3  . Smokeless tobacco: Never Used  Substance Use Topics  . Alcohol use: Yes     Alcohol/week: 1.0 standard drinks    Types: 1 Glasses of wine per week    Family History  Problem Relation Age of Onset  . Heart disease Mother        valve replacement  . Cancer Mother        breast, throat  . Heart disease Father        died of brain embolism after hip surgery  . Arthritis Father   . Arthritis Brother   . Cancer Maternal Aunt        various cancers  . Diabetes Neg Hx   . Hypertension Neg Hx   . Stroke Neg Hx      Review of Systems  Neurological: Negative for tremors and headaches.    Objective:  Physical Exam  Constitutional: She is oriented to person, place, and time. She appears well-developed and well-nourished.  HENT:  Head: Normocephalic and atraumatic.  Eyes: Pupils are equal, round, and reactive to light. Conjunctivae and EOM are normal.  Neck: Normal range of motion. Neck supple. No thyromegaly present.  Cardiovascular: Normal rate, regular rhythm, normal heart sounds and intact distal pulses.  No murmur heard. Respiratory: Effort normal and breath sounds normal. She has no wheezes.  GI: Soft. Bowel sounds are normal. She exhibits no mass. There is no abdominal tenderness.  Neurological: She is alert and oriented to person, place, and time.  Skin: Skin is dry.  Psychiatric: She has a normal mood and affect. Her behavior is normal. Judgment and thought content normal.  Musculoskeletal:  ROM 3-105 left knee. Trace effusion.  No warmth or erythema  Vital signs in last 24 hours: Temp:  [98.1 F (36.7 C)] 98.1 F (36.7 C) (07/21 0816) Pulse Rate:  [73] 73 (07/21 0816) Resp:  [14] 14 (07/21 0816) BP: (136)/(87) 136/87 (07/21 0816) Weight:  [67.6 kg] 67.6 kg (07/21 0816)    Estimated body mass index is 26.39 kg/m as calculated from the following:   Height as of 06/07/19: 5\' 3"  (1.6 m).   Weight as of 06/07/19: 67.6 kg.   Imaging Review Plain radiographs demonstrate moderate degenerative joint disease of the left knee(s). The overall  alignment ismild valgus. The bone quality appears to be good for age and reported activity level.      Assessment/Plan:  End stage arthritis, left knee   The patient history, physical examination, clinical judgment of the provider and imaging studies are consistent with end stage degenerative joint disease of the left knee(s) and total knee arthroplasty is deemed medically necessary. The treatment options including medical management, injection therapy arthroscopy and arthroplasty were discussed at length. The risks and benefits of total knee arthroplasty were presented and reviewed. The risks due to aseptic loosening, infection, stiffness, patella tracking problems, thromboembolic complications and other imponderables were discussed. The patient acknowledged the explanation, agreed to proceed with the plan and consent was signed. Patient is being admitted for inpatient treatment  for surgery, pain control, PT, OT, prophylactic antibiotics, VTE prophylaxis, progressive ambulation and ADL's and discharge planning. The patient is planning to be discharged home with home health services     Patient's anticipated LOS is less than 2 midnights, meeting these requirements: - Younger than 26 - Lives within 1 hour of care - Has a competent adult at home to recover with post-op recover - NO history of  - Chronic pain requiring opiods  - Diabetes  - Coronary Artery Disease  - Heart failure  - Heart attack  - Stroke  - DVT/VTE  - Cardiac arrhythmia  - Respiratory Failure/COPD  - Renal failure  - Anemia  - Advanced Liver disease  Face-to-face time spent with patient was greater than 40 minutes.  Greater than 50% of the time was spent in counseling and coordination of care.   Mike Craze Jansen, [place] [phone]  06/07/2019 9:45 AM

## 2019-06-08 DIAGNOSIS — H903 Sensorineural hearing loss, bilateral: Secondary | ICD-10-CM | POA: Diagnosis not present

## 2019-06-10 ENCOUNTER — Other Ambulatory Visit (HOSPITAL_COMMUNITY)
Admission: RE | Admit: 2019-06-10 | Discharge: 2019-06-10 | Disposition: A | Discharge status: Home / Self Care | Payer: Medicare Other | Source: Ambulatory Visit | Attending: Orthopaedic Surgery | Admitting: Orthopaedic Surgery

## 2019-06-10 DIAGNOSIS — Z1159 Encounter for screening for other viral diseases: Secondary | ICD-10-CM | POA: Diagnosis not present

## 2019-06-10 NOTE — Patient Instructions (Addendum)
YOU HAVE HADTHE COVID 19 TEST, PLEASE BEGIN THE QUARANTINE INSTRUCTIONS AS OUTLINED IN YOUR HANDOUT.                Falecia Vannatter Pontius  06/10/2019   Your procedure is scheduled on: 06-14-19     Report to [facility] Main  Entrance    Report to Short Stay at  5:30 AM    1 VISITOR IS ALLOWED TO WAIT IN WAITING ROOM  ONLY DAY OF YOUR SURGERY.    Call this number if you have problems the morning of surgery [phone]    Remember: After Midnight. CLEAR LIQUIDS UNTIL 3 HOURS PRIOR TO SCHEDULED SURGERY. PLEASE FINISH ENSURE DRINK AT 4:15 AM.   CLEAR LIQUID DIET   Foods Allowed                                                                     Foods Excluded  Coffee and tea, regular and decaf                             liquids that you cannot  Plain Jell-O any favor except red or purple                                           see through such as: Fruit ices (not with fruit pulp)                                     milk, soups, orange juice  Iced Popsicles                                    All solid food Carbonated beverages, regular and diet                                    Cranberry, grape and apple juices Sports drinks like Gatorade Lightly seasoned clear broth or consume(fat free) Sugar, honey syrup  Sample Menu Breakfast                                Lunch                                     Supper Cranberry juice                    Beef broth                            Chicken broth Jell-O  Grape juice                           Apple juice Coffee or tea                        Jell-O                                      Popsicle                                                Coffee or tea                        Coffee or tea  _____________________________________________________________________                        Take these medicines the morning of surgery with A SIP OF WATER: Venlafaxine XR (Effexor), and  Atorvastatin (Lipitor). You may also use and bring your eyedrops   BRUSH YOUR TEETH MORNING OF SURGERY AND RINSE YOUR MOUTH OUT, NO CHEWING GUM CANDY OR MINTS.                You may not have any metal on your body including hair pins and              piercings     Do not wear jewelry, make-up, lotions, powders or perfumes, deodorant              Do not wear nail polish.  Do not shave  48 hours prior to surgery.           Do not bring valuables to the hospital. [place].   Contacts, dentures or bridgework may not be worn into surgery.                Please read over the following fact sheets you were given: _____________________________________________________________________             [facility] - Preparing for Surgery Before surgery, you can play an important role.  Because skin is not sterile, your skin needs to be as free of germs as possible.  You can reduce the number of germs on your skin by washing with CHG (chlorahexidine gluconate) soap before surgery.  CHG is an antiseptic cleaner which kills germs and bonds with the skin to continue killing germs even after washing. Please DO NOT use if you have an allergy to CHG or antibacterial soaps.  If your skin becomes reddened/irritated stop using the CHG and inform your nurse when you arrive at Short Stay. Do not shave (including legs and underarms) for at least 48 hours prior to the first CHG shower.  You may shave your face/neck. Please follow these instructions carefully:  1.  Shower with CHG Soap the night before surgery and the  morning of Surgery.  2.  If you choose to wash your hair, wash your hair first as usual with your  normal  shampoo.  3.  After you shampoo, rinse your hair and body thoroughly  to remove the  shampoo.                           4.  Use CHG as you would any other liquid soap.  You can apply chg directly  to the skin and wash                        Gently with a scrungie or clean washcloth.  5.  Apply the CHG Soap to your body ONLY FROM THE NECK DOWN.   Do not use on face/ open                           Wound or open sores. Avoid contact with eyes, ears mouth and genitals (private parts).                       Wash face,  Genitals (private parts) with your normal soap.             6.  Wash thoroughly, paying special attention to the area where your surgery  will be performed.  7.  Thoroughly rinse your body with warm water from the neck down.  8.  DO NOT shower/wash with your normal soap after using and rinsing off  the CHG Soap.                9.  Pat yourself dry with a clean towel.            10.  Wear clean pajamas.            11.  Place clean sheets on your bed the night of your first shower and do not  sleep with pets. Day of Surgery : Do not apply any lotions/deodorants the morning of surgery.  Please wear clean clothes to the hospital/surgery center.  FAILURE TO FOLLOW THESE INSTRUCTIONS MAY RESULT IN THE CANCELLATION OF YOUR SURGERY PATIENT SIGNATURE_________________________________  NURSE SIGNATURE__________________________________  ________________________________________________________________________   Adam Phenix  An incentive spirometer is a tool that can help keep your lungs clear and active. This tool measures how well you are filling your lungs with each breath. Taking long deep breaths may help reverse or decrease the chance of developing breathing (pulmonary) problems (especially infection) following:  A long period of time when you are unable to move or be active. BEFORE THE PROCEDURE   If the spirometer includes an indicator to show your best effort, your nurse or respiratory therapist will set it to a desired goal.  If possible, sit up straight or lean slightly forward. Try not to slouch.  Hold the incentive spirometer in an upright position. INSTRUCTIONS FOR USE  1. Sit on the edge of your bed  if possible, or sit up as far as you can in bed or on a chair. 2. Hold the incentive spirometer in an upright position. 3. Breathe out normally. 4. Place the mouthpiece in your mouth and seal your lips tightly around it. 5. Breathe in slowly and as deeply as possible, raising the piston or the ball toward the top of the column. 6. Hold your breath for 3-5 seconds or for as long as possible. Allow the piston or ball to fall to the bottom of the column. 7. Remove the mouthpiece from your mouth and breathe out normally. 8. Rest for a few seconds and repeat Steps 1 through 7  at least 10 times every 1-2 hours when you are awake. Take your time and take a few normal breaths between deep breaths. 9. The spirometer may include an indicator to show your best effort. Use the indicator as a goal to work toward during each repetition. 10. After each set of 10 deep breaths, practice coughing to be sure your lungs are clear. If you have an incision (the cut made at the time of surgery), support your incision when coughing by placing a pillow or rolled up towels firmly against it. Once you are able to get out of bed, walk around indoors and cough well. You may stop using the incentive spirometer when instructed by your caregiver.  RISKS AND COMPLICATIONS  Take your time so you do not get dizzy or light-headed.  If you are in pain, you may need to take or ask for pain medication before doing incentive spirometry. It is harder to take a deep breath if you are having pain. AFTER USE  Rest and breathe slowly and easily.  It can be helpful to keep track of a log of your progress. Your caregiver can provide you with a simple table to help with this. If you are using the spirometer at home, follow these instructions: [place] IF:   You are having difficultly using the spirometer.  You have trouble using the spirometer as often as instructed.  Your pain medication is not giving enough relief while  using the spirometer.  You develop fever of 100.5 F (38.1 C) or higher. SEEK IMMEDIATE MEDICAL CARE IF:   You cough up bloody sputum that had not been present before.  You develop fever of 102 F (38.9 C) or greater.  You develop worsening pain at or near the incision site. MAKE SURE YOU:   Understand these instructions.  Will watch your condition.  Will get help right away if you are not doing well or get worse. Document Released: 03/16/2007 Document Revised: 01/26/2012 Document Reviewed: 05/17/2007 Exit[employer] Patient Information 2014 [employer], [place].   ________________________________________________________________________  WHAT IS A BLOOD TRANSFUSION? Blood Transfusion Information  A transfusion is the replacement of blood or some of its parts. Blood is made up of multiple cells which provide different functions.  Red blood cells carry oxygen and are used for blood loss replacement.  White blood cells fight against infection.  Platelets control bleeding.  Plasma helps clot blood.  Other blood products are available for specialized needs, such as hemophilia or other clotting disorders. BEFORE THE TRANSFUSION  Who gives blood for transfusions?   Healthy volunteers who are fully evaluated to make sure their blood is safe. This is blood bank blood. Transfusion therapy is the safest it has ever been in the practice of medicine. Before blood is taken from a donor, a complete history is taken to make sure that person has no history of diseases nor engages in risky social behavior (examples are intravenous drug use or sexual activity with multiple partners). The donor's travel history is screened to minimize risk of transmitting infections, such as malaria. The donated blood is tested for signs of infectious diseases, such as HIV and hepatitis. The blood is then tested to be sure it is compatible with you in order to minimize the chance of a transfusion reaction. If you or a  relative donates blood, this is often done in anticipation of surgery and is not appropriate for emergency situations. It takes many days to process the donated blood. RISKS AND COMPLICATIONS Although  transfusion therapy is very safe and saves many lives, the main dangers of transfusion include:   Getting an infectious disease.  Developing a transfusion reaction. This is an allergic reaction to something in the blood you were given. Every precaution is taken to prevent this. The decision to have a blood transfusion has been considered carefully by your caregiver before blood is given. Blood is not given unless the benefits outweigh the risks. AFTER THE TRANSFUSION  Right after receiving a blood transfusion, you will usually feel much better and more energetic. This is especially true if your red blood cells have gotten low (anemic). The transfusion raises the level of the red blood cells which carry oxygen, and this usually causes an energy increase.  The nurse administering the transfusion will monitor you carefully for complications. HOME CARE INSTRUCTIONS  No special instructions are needed after a transfusion. You may find your energy is better. Speak with your caregiver about any limitations on activity for underlying diseases you may have. SEEK MEDICAL CARE IF:   Your condition is not improving after your transfusion.  You develop redness or irritation at the intravenous (IV) site. SEEK IMMEDIATE MEDICAL CARE IF:  Any of the following symptoms occur over the next 12 hours:  Shaking chills.  You have a temperature by mouth above 102 F (38.9 C), not controlled by medicine.  Chest, back, or muscle pain.  People around you feel you are not acting correctly or are confused.  Shortness of breath or difficulty breathing.  Dizziness and fainting.  You get a rash or develop hives.  You have a decrease in urine output.  Your urine turns a dark color or changes to pink, red, or  brown. Any of the following symptoms occur over the next 10 days:  You have a temperature by mouth above 102 F (38.9 C), not controlled by medicine.  Shortness of breath.  Weakness after normal activity.  The white part of the eye turns yellow (jaundice).  You have a decrease in the amount of urine or are urinating less often.  Your urine turns a dark color or changes to pink, red, or brown. Document Released: 10/31/2000 Document Revised: 01/26/2012 Document Reviewed: 06/19/2008 [facility] Patient Information 2014 [place], [place].  _______________________________________________________________________

## 2019-06-11 LAB — SARS CORONAVIRUS 2 (TAT 6-24 HRS): SARS Coronavirus 2: NEGATIVE

## 2019-06-13 ENCOUNTER — Encounter (HOSPITAL_COMMUNITY)
Admission: RE | Admit: 2019-06-13 | Discharge: 2019-06-13 | Disposition: A | Discharge status: Home / Self Care | Payer: Medicare Other | Source: Ambulatory Visit | Attending: Orthopaedic Surgery | Admitting: Orthopaedic Surgery

## 2019-06-13 ENCOUNTER — Ambulatory Visit (HOSPITAL_COMMUNITY)
Admission: RE | Admit: 2019-06-13 | Discharge: 2019-06-13 | Disposition: A | Discharge status: Home / Self Care | Payer: Medicare Other | Source: Ambulatory Visit | Attending: Orthopedic Surgery | Admitting: Orthopedic Surgery

## 2019-06-13 ENCOUNTER — Encounter (HOSPITAL_COMMUNITY): Payer: Self-pay

## 2019-06-13 ENCOUNTER — Other Ambulatory Visit: Payer: Self-pay

## 2019-06-13 DIAGNOSIS — Z01818 Encounter for other preprocedural examination: Secondary | ICD-10-CM

## 2019-06-13 DIAGNOSIS — Z8249 Family history of ischemic heart disease and other diseases of the circulatory system: Secondary | ICD-10-CM | POA: Diagnosis not present

## 2019-06-13 DIAGNOSIS — E785 Hyperlipidemia, unspecified: Secondary | ICD-10-CM | POA: Diagnosis not present

## 2019-06-13 DIAGNOSIS — Z87891 Personal history of nicotine dependence: Secondary | ICD-10-CM | POA: Diagnosis not present

## 2019-06-13 DIAGNOSIS — Z96651 Presence of right artificial knee joint: Secondary | ICD-10-CM | POA: Diagnosis not present

## 2019-06-13 DIAGNOSIS — Z96659 Presence of unspecified artificial knee joint: Secondary | ICD-10-CM | POA: Diagnosis not present

## 2019-06-13 DIAGNOSIS — M1712 Unilateral primary osteoarthritis, left knee: Secondary | ICD-10-CM | POA: Diagnosis not present

## 2019-06-13 DIAGNOSIS — I110 Hypertensive heart disease with heart failure: Secondary | ICD-10-CM | POA: Diagnosis not present

## 2019-06-13 DIAGNOSIS — H409 Unspecified glaucoma: Secondary | ICD-10-CM | POA: Diagnosis not present

## 2019-06-13 DIAGNOSIS — Z8261 Family history of arthritis: Secondary | ICD-10-CM | POA: Diagnosis not present

## 2019-06-13 DIAGNOSIS — F329 Major depressive disorder, single episode, unspecified: Secondary | ICD-10-CM | POA: Diagnosis not present

## 2019-06-13 DIAGNOSIS — I509 Heart failure, unspecified: Secondary | ICD-10-CM | POA: Diagnosis not present

## 2019-06-13 LAB — COMPREHENSIVE METABOLIC PANEL
ALT: 21 U/L (ref 0–44)
AST: 20 U/L (ref 15–41)
Albumin: 4.1 g/dL (ref 3.5–5.0)
Alkaline Phosphatase: 56 U/L (ref 38–126)
Anion gap: 11 (ref 5–15)
BUN: 19 mg/dL (ref 8–23)
CO2: 28 mmol/L (ref 22–32)
Calcium: 9.4 mg/dL (ref 8.9–10.3)
Chloride: 101 mmol/L (ref 98–111)
Creatinine, Ser: 0.58 mg/dL (ref 0.44–1.00)
GFR calc Af Amer: >60 mL/min (ref >60)
GFR calc non Af Amer: >60 mL/min (ref >60)
Glucose, Bld: 123 mg/dL — ABNORMAL HIGH (ref 70–99)
Potassium: 4.5 mmol/L (ref 3.5–5.1)
Sodium: 140 mmol/L (ref 135–145)
Total Bilirubin: 0.4 mg/dL (ref 0.3–1.2)
Total Protein: 7 g/dL (ref 6.5–8.1)

## 2019-06-13 LAB — CBC WITH DIFFERENTIAL/PLATELET
Abs Immature Granulocytes: 0.01 10*3/uL (ref 0.00–0.07)
Basophils Absolute: 0 10*3/uL (ref 0.0–0.1)
Basophils Relative: 1 %
Eosinophils Absolute: 0.1 10*3/uL (ref 0.0–0.5)
Eosinophils Relative: 3 %
HCT: 37.5 % (ref 36.0–46.0)
Hemoglobin: 12.3 g/dL (ref 12.0–15.0)
Immature Granulocytes: 0 %
Lymphocytes Relative: 33 %
Lymphs Abs: 1.4 10*3/uL (ref 0.7–4.0)
MCH: 30.6 pg (ref 26.0–34.0)
MCHC: 32.8 g/dL (ref 30.0–36.0)
MCV: 93.3 fL (ref 80.0–100.0)
Monocytes Absolute: 0.4 10*3/uL (ref 0.1–1.0)
Monocytes Relative: 9 %
Neutro Abs: 2.3 10*3/uL (ref 1.7–7.7)
Neutrophils Relative %: 54 %
Platelets: 268 10*3/uL (ref 150–400)
RBC: 4.02 MIL/uL (ref 3.87–5.11)
RDW: 14.6 % (ref 11.5–15.5)
WBC: 4.2 10*3/uL (ref 4.0–10.5)
nRBC: 0 % (ref 0.0–0.2)

## 2019-06-13 LAB — ABO/RH: ABO/RH(D): O POS

## 2019-06-13 LAB — URINALYSIS, ROUTINE W REFLEX MICROSCOPIC
Bilirubin Urine: NEGATIVE
Glucose, UA: NEGATIVE mg/dL
Hgb urine dipstick: NEGATIVE
Ketones, ur: NEGATIVE mg/dL
Leukocytes,Ua: NEGATIVE
Nitrite: NEGATIVE
Protein, ur: NEGATIVE mg/dL
Specific Gravity, Urine: 1.016 (ref 1.005–1.030)
pH: 7 (ref 5.0–8.0)

## 2019-06-13 LAB — APTT: aPTT: 28 seconds (ref 24–36)

## 2019-06-13 LAB — PROTIME-INR
INR: 1 (ref 0.8–1.2)
Prothrombin Time: 13.1 seconds (ref 11.4–15.2)

## 2019-06-13 LAB — SURGICAL PCR SCREEN
MRSA, PCR: NEGATIVE
Staphylococcus aureus: NEGATIVE

## 2019-06-13 MED ORDER — TRANEXAMIC ACID 1000 MG/10ML IV SOLN
2000.0000 mg | INTRAVENOUS | Status: DC
  Filled 2019-06-13: qty 20

## 2019-06-14 ENCOUNTER — Encounter (HOSPITAL_COMMUNITY)
Admission: RE | Disposition: A | Discharge status: Home / Self Care | Payer: Self-pay | Source: Home / Self Care | Attending: Orthopaedic Surgery

## 2019-06-14 ENCOUNTER — Encounter (HOSPITAL_COMMUNITY): Payer: Self-pay | Admitting: *Deleted

## 2019-06-14 ENCOUNTER — Inpatient Hospital Stay (HOSPITAL_COMMUNITY)
Admission: RE | Admit: 2019-06-14 | Discharge: 2019-06-16 | DRG: 470 | Disposition: A | Discharge status: Home Health | Payer: Medicare Other | Attending: Orthopaedic Surgery | Admitting: Orthopaedic Surgery

## 2019-06-14 ENCOUNTER — Ambulatory Visit (HOSPITAL_COMMUNITY): Payer: Medicare Other | Admitting: Anesthesiology

## 2019-06-14 ENCOUNTER — Ambulatory Visit (HOSPITAL_COMMUNITY): Payer: Medicare Other | Admitting: Physician Assistant

## 2019-06-14 DIAGNOSIS — Z8249 Family history of ischemic heart disease and other diseases of the circulatory system: Secondary | ICD-10-CM

## 2019-06-14 DIAGNOSIS — Z8261 Family history of arthritis: Secondary | ICD-10-CM

## 2019-06-14 DIAGNOSIS — M1712 Unilateral primary osteoarthritis, left knee: Secondary | ICD-10-CM | POA: Diagnosis not present

## 2019-06-14 DIAGNOSIS — I110 Hypertensive heart disease with heart failure: Secondary | ICD-10-CM | POA: Diagnosis not present

## 2019-06-14 DIAGNOSIS — E785 Hyperlipidemia, unspecified: Secondary | ICD-10-CM | POA: Diagnosis present

## 2019-06-14 DIAGNOSIS — F329 Major depressive disorder, single episode, unspecified: Secondary | ICD-10-CM | POA: Diagnosis not present

## 2019-06-14 DIAGNOSIS — H409 Unspecified glaucoma: Secondary | ICD-10-CM | POA: Diagnosis present

## 2019-06-14 DIAGNOSIS — G8918 Other acute postprocedural pain: Secondary | ICD-10-CM | POA: Diagnosis not present

## 2019-06-14 DIAGNOSIS — Z87891 Personal history of nicotine dependence: Secondary | ICD-10-CM | POA: Diagnosis not present

## 2019-06-14 DIAGNOSIS — Z96652 Presence of left artificial knee joint: Secondary | ICD-10-CM

## 2019-06-14 DIAGNOSIS — I509 Heart failure, unspecified: Secondary | ICD-10-CM | POA: Diagnosis present

## 2019-06-14 DIAGNOSIS — I10 Essential (primary) hypertension: Secondary | ICD-10-CM | POA: Diagnosis not present

## 2019-06-14 DIAGNOSIS — Z96651 Presence of right artificial knee joint: Secondary | ICD-10-CM | POA: Diagnosis not present

## 2019-06-14 HISTORY — PX: TOTAL KNEE ARTHROPLASTY: SHX125

## 2019-06-14 LAB — TYPE AND SCREEN
ABO/RH(D): O POS
Antibody Screen: NEGATIVE

## 2019-06-14 LAB — URINE CULTURE: Culture: NO GROWTH

## 2019-06-14 SURGERY — ARTHROPLASTY, KNEE, TOTAL
Anesthesia: Spinal | Site: Knee | Laterality: Left

## 2019-06-14 MED ORDER — CEFAZOLIN SODIUM-DEXTROSE 2-4 GM/100ML-% IV SOLN
2.0000 g | INTRAVENOUS | Status: AC
  Administered 2019-06-14: 2 g via INTRAVENOUS

## 2019-06-14 MED ORDER — PROPOFOL 10 MG/ML IV BOLUS
INTRAVENOUS | Status: AC
  Filled 2019-06-14: qty 20

## 2019-06-14 MED ORDER — SODIUM CHLORIDE 0.9 % IV SOLN
75.0000 mL/h | INTRAVENOUS | Status: DC
  Administered 2019-06-14 – 2019-06-15 (×2): 75 mL/h via INTRAVENOUS

## 2019-06-14 MED ORDER — VITAMIN D 25 MCG (1000 UNIT) PO TABS
1000.0000 [IU] | ORAL_TABLET | Freq: Every day | ORAL | Status: DC
  Administered 2019-06-15 – 2019-06-16 (×2): 1000 [IU] via ORAL
  Filled 2019-06-14 (×2): qty 1

## 2019-06-14 MED ORDER — HYDROMORPHONE HCL 1 MG/ML IJ SOLN: 0.2500 mg | INTRAMUSCULAR | Status: DC | PRN

## 2019-06-14 MED ORDER — ONDANSETRON HCL 4 MG/2ML IJ SOLN
INTRAMUSCULAR | Status: AC
  Filled 2019-06-14: qty 2

## 2019-06-14 MED ORDER — CHLORHEXIDINE GLUCONATE 4 % EX LIQD: 60.0000 mL | Freq: Once | CUTANEOUS | Status: DC

## 2019-06-14 MED ORDER — METOCLOPRAMIDE HCL 5 MG/ML IJ SOLN
5.0000 mg | Freq: Three times a day (TID) | INTRAMUSCULAR | Status: DC | PRN
  Administered 2019-06-16: 10 mg via INTRAVENOUS
  Filled 2019-06-14: qty 2

## 2019-06-14 MED ORDER — OXYCODONE HCL 5 MG PO TABS
5.0000 mg | ORAL_TABLET | ORAL | Status: DC | PRN
  Administered 2019-06-14 (×2): 5 mg via ORAL
  Administered 2019-06-15: 10 mg via ORAL
  Administered 2019-06-15: 5 mg via ORAL
  Administered 2019-06-15 – 2019-06-16 (×6): 10 mg via ORAL
  Filled 2019-06-14 (×5): qty 2
  Filled 2019-06-14 (×2): qty 1
  Filled 2019-06-14 (×2): qty 2

## 2019-06-14 MED ORDER — SODIUM CHLORIDE 0.9 % IR SOLN
Status: DC | PRN
  Administered 2019-06-14: 1000 mL

## 2019-06-14 MED ORDER — HYDROMORPHONE HCL 1 MG/ML IJ SOLN: 0.5000 mg | INTRAMUSCULAR | Status: DC | PRN

## 2019-06-14 MED ORDER — OLMESARTAN MEDOXOMIL-HCTZ 40-12.5 MG PO TABS: 1.0000 | ORAL_TABLET | Freq: Every day | ORAL | Status: DC

## 2019-06-14 MED ORDER — METHOCARBAMOL 500 MG IVPB - SIMPLE MED
500.0000 mg | Freq: Four times a day (QID) | INTRAVENOUS | Status: DC | PRN
  Filled 2019-06-14: qty 50

## 2019-06-14 MED ORDER — FENTANYL CITRATE (PF) 100 MCG/2ML IJ SOLN
INTRAMUSCULAR | Status: AC
  Filled 2019-06-14: qty 2

## 2019-06-14 MED ORDER — ONDANSETRON HCL 4 MG/2ML IJ SOLN
4.0000 mg | Freq: Four times a day (QID) | INTRAMUSCULAR | Status: DC | PRN
  Administered 2019-06-14 – 2019-06-15 (×3): 4 mg via INTRAVENOUS
  Filled 2019-06-14 (×3): qty 2

## 2019-06-14 MED ORDER — BRIMONIDINE TARTRATE-TIMOLOL 0.2-0.5 % OP SOLN
1.0000 [drp] | Freq: Two times a day (BID) | OPHTHALMIC | Status: DC
  Filled 2019-06-14: qty 5

## 2019-06-14 MED ORDER — METHOCARBAMOL 500 MG PO TABS
500.0000 mg | ORAL_TABLET | Freq: Three times a day (TID) | ORAL | Status: DC | PRN
  Administered 2019-06-14 – 2019-06-15 (×4): 500 mg via ORAL
  Filled 2019-06-14 (×3): qty 1

## 2019-06-14 MED ORDER — FLEET ENEMA 7-19 GM/118ML RE ENEM: 1.0000 | ENEMA | Freq: Once | RECTAL | Status: DC | PRN

## 2019-06-14 MED ORDER — OXYCODONE HCL 5 MG PO TABS
ORAL_TABLET | ORAL | Status: AC
  Filled 2019-06-14: qty 1

## 2019-06-14 MED ORDER — TRANEXAMIC ACID 1000 MG/10ML IV SOLN
INTRAVENOUS | Status: DC | PRN
  Administered 2019-06-14: 2000 mg via TOPICAL

## 2019-06-14 MED ORDER — VENLAFAXINE HCL ER 150 MG PO CP24
150.0000 mg | ORAL_CAPSULE | Freq: Every day | ORAL | Status: DC
  Administered 2019-06-15 – 2019-06-16 (×2): 150 mg via ORAL
  Filled 2019-06-14 (×2): qty 1

## 2019-06-14 MED ORDER — CEFAZOLIN SODIUM-DEXTROSE 2-4 GM/100ML-% IV SOLN
2.0000 g | Freq: Four times a day (QID) | INTRAVENOUS | Status: AC
  Administered 2019-06-14 (×2): 2 g via INTRAVENOUS
  Filled 2019-06-14 (×2): qty 100

## 2019-06-14 MED ORDER — IRBESARTAN 150 MG PO TABS
300.0000 mg | ORAL_TABLET | Freq: Every day | ORAL | Status: DC
  Administered 2019-06-15 – 2019-06-16 (×2): 300 mg via ORAL
  Filled 2019-06-14 (×2): qty 2

## 2019-06-14 MED ORDER — LATANOPROST 0.005 % OP SOLN
1.0000 [drp] | Freq: Every day | OPHTHALMIC | Status: DC
  Filled 2019-06-14: qty 2.5

## 2019-06-14 MED ORDER — CEFAZOLIN SODIUM-DEXTROSE 2-4 GM/100ML-% IV SOLN
INTRAVENOUS | Status: AC
  Filled 2019-06-14: qty 100

## 2019-06-14 MED ORDER — ATORVASTATIN CALCIUM 10 MG PO TABS
10.0000 mg | ORAL_TABLET | Freq: Every day | ORAL | Status: DC
  Administered 2019-06-15 – 2019-06-16 (×2): 10 mg via ORAL
  Filled 2019-06-14 (×2): qty 1

## 2019-06-14 MED ORDER — PHENOL 1.4 % MT LIQD: 1.0000 | OROMUCOSAL | Status: DC | PRN

## 2019-06-14 MED ORDER — PROPOFOL 500 MG/50ML IV EMUL
INTRAVENOUS | Status: DC | PRN
  Administered 2019-06-14: 75 ug/kg/min via INTRAVENOUS

## 2019-06-14 MED ORDER — MAGNESIUM HYDROXIDE 400 MG/5ML PO SUSP: 30.0000 mL | Freq: Every day | ORAL | Status: DC | PRN

## 2019-06-14 MED ORDER — HYDROCHLOROTHIAZIDE 12.5 MG PO CAPS
12.5000 mg | ORAL_CAPSULE | Freq: Every day | ORAL | Status: DC
  Administered 2019-06-15 – 2019-06-16 (×2): 12.5 mg via ORAL
  Filled 2019-06-14 (×2): qty 1

## 2019-06-14 MED ORDER — BUPIVACAINE HCL (PF) 0.5 % IJ SOLN
INTRAMUSCULAR | Status: AC
  Filled 2019-06-14: qty 30

## 2019-06-14 MED ORDER — DEXAMETHASONE SODIUM PHOSPHATE 10 MG/ML IJ SOLN
INTRAMUSCULAR | Status: DC | PRN
  Administered 2019-06-14: 10 mg via INTRAVENOUS

## 2019-06-14 MED ORDER — BRIMONIDINE TARTRATE 0.2 % OP SOLN
1.0000 [drp] | Freq: Two times a day (BID) | OPHTHALMIC | Status: DC
  Administered 2019-06-14: 1 [drp] via OPHTHALMIC
  Filled 2019-06-14: qty 5

## 2019-06-14 MED ORDER — ACETAMINOPHEN 10 MG/ML IV SOLN
INTRAVENOUS | Status: DC | PRN
  Administered 2019-06-14: 1000 mg via INTRAVENOUS

## 2019-06-14 MED ORDER — ONDANSETRON HCL 4 MG PO TABS
4.0000 mg | ORAL_TABLET | Freq: Four times a day (QID) | ORAL | Status: DC | PRN
  Administered 2019-06-15: 4 mg via ORAL
  Filled 2019-06-14: qty 1

## 2019-06-14 MED ORDER — DIPHENHYDRAMINE HCL 12.5 MG/5ML PO ELIX: 12.5000 mg | ORAL_SOLUTION | ORAL | Status: DC | PRN

## 2019-06-14 MED ORDER — POVIDONE-IODINE 10 % EX SWAB: 2.0000 "application " | Freq: Once | CUTANEOUS | Status: DC

## 2019-06-14 MED ORDER — SODIUM CHLORIDE 0.9 % IV SOLN
INTRAVENOUS | Status: DC | PRN
  Administered 2019-06-14: 15 ug/min via INTRAVENOUS

## 2019-06-14 MED ORDER — TIMOLOL MALEATE 0.5 % OP SOLN
1.0000 [drp] | Freq: Two times a day (BID) | OPHTHALMIC | Status: DC
  Administered 2019-06-14: 1 [drp] via OPHTHALMIC
  Filled 2019-06-14: qty 5

## 2019-06-14 MED ORDER — SODIUM CHLORIDE 0.9 % IV SOLN: INTRAVENOUS | Status: DC

## 2019-06-14 MED ORDER — FENTANYL CITRATE (PF) 100 MCG/2ML IJ SOLN
INTRAMUSCULAR | Status: DC | PRN
  Administered 2019-06-14 (×2): 50 ug via INTRAVENOUS

## 2019-06-14 MED ORDER — KETOROLAC TROMETHAMINE 15 MG/ML IJ SOLN
7.5000 mg | Freq: Four times a day (QID) | INTRAMUSCULAR | Status: AC
  Administered 2019-06-14 – 2019-06-15 (×4): 7.5 mg via INTRAVENOUS
  Filled 2019-06-14 (×4): qty 1

## 2019-06-14 MED ORDER — METOCLOPRAMIDE HCL 5 MG PO TABS
5.0000 mg | ORAL_TABLET | Freq: Three times a day (TID) | ORAL | Status: DC | PRN
  Administered 2019-06-15 – 2019-06-16 (×3): 10 mg via ORAL
  Filled 2019-06-14 (×3): qty 2

## 2019-06-14 MED ORDER — DOCUSATE SODIUM 100 MG PO CAPS
100.0000 mg | ORAL_CAPSULE | Freq: Two times a day (BID) | ORAL | Status: DC
  Administered 2019-06-14 – 2019-06-16 (×4): 100 mg via ORAL
  Filled 2019-06-14 (×4): qty 1

## 2019-06-14 MED ORDER — PROPOFOL 10 MG/ML IV BOLUS
INTRAVENOUS | Status: AC
  Filled 2019-06-14: qty 60

## 2019-06-14 MED ORDER — ONDANSETRON HCL 4 MG/2ML IJ SOLN: 4.0000 mg | Freq: Once | INTRAMUSCULAR | Status: DC | PRN

## 2019-06-14 MED ORDER — PROPOFOL 10 MG/ML IV BOLUS
INTRAVENOUS | Status: DC | PRN
  Administered 2019-06-14: 20 mg via INTRAVENOUS

## 2019-06-14 MED ORDER — ACETAMINOPHEN 10 MG/ML IV SOLN
INTRAVENOUS | Status: AC
  Filled 2019-06-14: qty 100

## 2019-06-14 MED ORDER — MIDAZOLAM HCL 5 MG/5ML IJ SOLN
INTRAMUSCULAR | Status: DC | PRN
  Administered 2019-06-14: 2 mg via INTRAVENOUS

## 2019-06-14 MED ORDER — BUPIVACAINE-EPINEPHRINE 0.5% -1:200000 IJ SOLN
INTRAMUSCULAR | Status: AC
  Filled 2019-06-14: qty 1

## 2019-06-14 MED ORDER — DEXAMETHASONE SODIUM PHOSPHATE 10 MG/ML IJ SOLN
INTRAMUSCULAR | Status: AC
  Filled 2019-06-14: qty 1

## 2019-06-14 MED ORDER — LACTATED RINGERS IV SOLN
INTRAVENOUS | Status: DC
  Administered 2019-06-14 (×3): via INTRAVENOUS

## 2019-06-14 MED ORDER — ONDANSETRON HCL 4 MG/2ML IJ SOLN
INTRAMUSCULAR | Status: DC | PRN
  Administered 2019-06-14: 4 mg via INTRAVENOUS

## 2019-06-14 MED ORDER — ASPIRIN 81 MG PO CHEW
81.0000 mg | CHEWABLE_TABLET | Freq: Two times a day (BID) | ORAL | Status: DC
  Administered 2019-06-14 – 2019-06-16 (×4): 81 mg via ORAL
  Filled 2019-06-14 (×4): qty 1

## 2019-06-14 MED ORDER — ROPIVACAINE HCL 7.5 MG/ML IJ SOLN
INTRAMUSCULAR | Status: DC | PRN
  Administered 2019-06-14: 20 mL via PERINEURAL

## 2019-06-14 MED ORDER — MEPERIDINE HCL 50 MG/ML IJ SOLN: 6.2500 mg | INTRAMUSCULAR | Status: DC | PRN

## 2019-06-14 MED ORDER — METHOCARBAMOL 500 MG PO TABS
ORAL_TABLET | ORAL | Status: AC
  Filled 2019-06-14: qty 1

## 2019-06-14 MED ORDER — MENTHOL 3 MG MT LOZG: 1.0000 | LOZENGE | OROMUCOSAL | Status: DC | PRN

## 2019-06-14 MED ORDER — CHLORHEXIDINE GLUCONATE 4 % EX LIQD: 60.0000 mL | Freq: Every day | CUTANEOUS | Status: DC

## 2019-06-14 MED ORDER — BUPIVACAINE IN DEXTROSE 0.75-8.25 % IT SOLN
INTRATHECAL | Status: DC | PRN
  Administered 2019-06-14: 2 mL via INTRATHECAL

## 2019-06-14 MED ORDER — BUPIVACAINE HCL (PF) 0.5 % IJ SOLN
INTRAMUSCULAR | Status: DC | PRN
  Administered 2019-06-14: 30 mL

## 2019-06-14 MED ORDER — MIDAZOLAM HCL 2 MG/2ML IJ SOLN
INTRAMUSCULAR | Status: AC
  Filled 2019-06-14: qty 2

## 2019-06-14 MED ORDER — POVIDONE-IODINE 10 % EX SWAB
2.0000 "application " | Freq: Once | CUTANEOUS | Status: AC
  Administered 2019-06-14: 2 via TOPICAL

## 2019-06-14 MED ORDER — ALUM & MAG HYDROXIDE-SIMETH 200-200-20 MG/5ML PO SUSP: 30.0000 mL | ORAL | Status: DC | PRN

## 2019-06-14 MED ORDER — BISACODYL 10 MG RE SUPP
10.0000 mg | Freq: Every day | RECTAL | Status: DC | PRN
  Filled 2019-06-14: qty 1

## 2019-06-14 SURGICAL SUPPLY — 61 items
ANCHOR SUT 1.45 SZ 1 SHORT (Anchor) ×1 IMPLANT
BAG DECANTER FOR FLEXI CONT (MISCELLANEOUS) ×2 IMPLANT
BAG ZIPLOCK 12X15 (MISCELLANEOUS) ×2 IMPLANT
BLADE SAGITTAL 25.0X1.19X90 (BLADE) ×2 IMPLANT
BNDG ELASTIC 4X5.8 VLCR STR LF (GAUZE/BANDAGES/DRESSINGS) ×2 IMPLANT
BNDG ELASTIC 6X10 VLCR STRL LF (GAUZE/BANDAGES/DRESSINGS) ×1 IMPLANT
BNDG ELASTIC 6X5.8 VLCR STR LF (GAUZE/BANDAGES/DRESSINGS) IMPLANT
BNDG GAUZE ELAST 4 BULKY (GAUZE/BANDAGES/DRESSINGS) ×3 IMPLANT
BOWL SMART MIX CTS (DISPOSABLE) ×3 IMPLANT
CEMENT HV SMART SET (Cement) ×5 IMPLANT
CEMENT TIBIA MBT (Knees) IMPLANT
COMP FEM CEM STD LT LCS (Orthopedic Implant) ×2 IMPLANT
COMP PATELLA PEGX3 CEM STAN (Knees) ×2 IMPLANT
COMPONENT FEM CEM STD LT LCS (Orthopedic Implant) IMPLANT
COMPONENT PTLLA PEGX3 CEM STAN (Knees) IMPLANT
COVER SURGICAL LIGHT HANDLE (MISCELLANEOUS) ×2 IMPLANT
COVER WAND RF STERILE (DRAPES) IMPLANT
CUFF TOURN SGL QUICK 34 (TOURNIQUET CUFF) ×1
CUFF TRNQT CYL 34X4.125X (TOURNIQUET CUFF) ×2 IMPLANT
DECANTER SPIKE VIAL GLASS SM (MISCELLANEOUS) ×3 IMPLANT
DRAPE IMP U-DRAPE 54X76 (DRAPES) ×2 IMPLANT
DRAPE SHEET LG 3/4 BI-LAMINATE (DRAPES) ×4 IMPLANT
DRAPE U-SHAPE 47X51 STRL (DRAPES) ×1 IMPLANT
DRSG ADAPTIC 3X8 NADH LF (GAUZE/BANDAGES/DRESSINGS) ×2 IMPLANT
DRSG PAD ABDOMINAL 8X10 ST (GAUZE/BANDAGES/DRESSINGS) ×2 IMPLANT
DURAPREP 26ML APPLICATOR (WOUND CARE) ×4 IMPLANT
ELECT PENCIL ROCKER SW 15FT (MISCELLANEOUS) ×1 IMPLANT
ELECT REM PT RETURN 15FT ADLT (MISCELLANEOUS) ×2 IMPLANT
GAUZE SPONGE 4X4 12PLY STRL (GAUZE/BANDAGES/DRESSINGS) ×2 IMPLANT
GLOVE BIOGEL PI IND STRL 8 (GLOVE) ×1 IMPLANT
GLOVE BIOGEL PI IND STRL 8.5 (GLOVE) ×1 IMPLANT
GLOVE BIOGEL PI INDICATOR 8 (GLOVE) ×1
GLOVE BIOGEL PI INDICATOR 8.5 (GLOVE) ×1
GLOVE ECLIPSE 8.0 STRL XLNG CF (GLOVE) ×5 IMPLANT
GLOVE ECLIPSE 8.5 STRL (GLOVE) ×5 IMPLANT
GOWN STRL REUS W/ TWL LRG LVL3 (GOWN DISPOSABLE) ×1 IMPLANT
GOWN STRL REUS W/TWL 2XL LVL3 (GOWN DISPOSABLE) ×2 IMPLANT
GOWN STRL REUS W/TWL LRG LVL3 (GOWN DISPOSABLE) ×1
HANDPIECE INTERPULSE COAX TIP (DISPOSABLE) ×1
HOLDER FOLEY CATH W/STRAP (MISCELLANEOUS) ×1 IMPLANT
INSERT TIB LCS RP STD 12.5 (Knees) ×1 IMPLANT
KIT TURNOVER KIT A (KITS) ×1 IMPLANT
MANIFOLD NEPTUNE II (INSTRUMENTS) ×2 IMPLANT
NS IRRIG 1000ML POUR BTL (IV SOLUTION) ×1 IMPLANT
PACK TOTAL KNEE CUSTOM (KITS) ×2 IMPLANT
PADDING CAST COTTON 6X4 STRL (CAST SUPPLIES) ×4 IMPLANT
PROTECTOR NERVE ULNAR (MISCELLANEOUS) ×2 IMPLANT
SET HNDPC FAN SPRY TIP SCT (DISPOSABLE) ×1 IMPLANT
SPONGE LAP 18X18 RF (DISPOSABLE) ×1 IMPLANT
STAPLER VISISTAT 35W (STAPLE) ×2 IMPLANT
SUT BONE WAX W31G (SUTURE) ×2 IMPLANT
SUT ETHIBOND NAB CT1 #1 30IN (SUTURE) ×5 IMPLANT
SUT MNCRL AB 3-0 PS2 18 (SUTURE) ×2 IMPLANT
SUT VIC AB 0 UR5 27 (SUTURE) ×1 IMPLANT
SUT VIC AB 2-0 PS2 27 (SUTURE) ×2 IMPLANT
TIBIA MBT CEMENT (Knees) ×2 IMPLANT
TRAY FOLEY MTR SLVR 14FR STAT (SET/KITS/TRAYS/PACK) ×1 IMPLANT
TRAY FOLEY MTR SLVR 16FR STAT (SET/KITS/TRAYS/PACK) ×1 IMPLANT
UNDERPAD 30X30 (UNDERPADS AND DIAPERS) ×2 IMPLANT
WATER STERILE IRR 1000ML POUR (IV SOLUTION) ×4 IMPLANT
WRAP KNEE MAXI GEL POST OP (GAUZE/BANDAGES/DRESSINGS) ×2 IMPLANT

## 2019-06-14 NOTE — Anesthesia Procedure Notes (Signed)
Procedure Name: MAC Date/Time: 06/14/2019 7:24 AM Performed by: Lissa Morales, CRNA Pre-anesthesia Checklist: Patient identified, Emergency Drugs available, Suction available, Patient being monitored and Timeout performed Patient Re-evaluated:Patient Re-evaluated prior to induction Oxygen Delivery Method: Simple face mask Placement Confirmation: positive ETCO2 Dental Injury: Teeth and Oropharynx as per pre-operative assessment

## 2019-06-14 NOTE — Op Note (Signed)
NAME: Jodi Duffy, Jodi Duffy MEDICAL RECORD DJ:[id number] ACCOUNT [id number] DATE OF BIRTH:July 04, 1951 FACILITY: [facility] LOCATION: [facility] PHYSICIAN:Kyrene Longan Sharlotte Alamo, MD  OPERATIVE REPORT  DATE OF PROCEDURE:  06/14/2019  PREOPERATIVE DIAGNOSIS:  End-stage osteoarthritis, left knee.  POSTOPERATIVE DIAGNOSIS:  End-stage osteoarthritis, left knee.  PROCEDURE:  Left total knee replacement.  SURGEON:  Joni Fears, MD  ASSISTANT:  Biagio Borg, PA-C  ANESTHESIA:  Spinal with adductor canal block and IV sedation.  COMPONENTS:  DePuy LCS standard femoral component, a #3 rotating keeled tibial tray with a 12.5 mm polyethylene bridging bearing, a metal-backed 3-peg rotating patella.  Components were secured with polymethyl methacrylate.  DESCRIPTION OF PROCEDURE:  The patient was met in the holding area and identified the left knee as the appropriate operative site and marked it accordingly.  Anesthesia performed an adductor canal block.  Any questions were answered.  The patient was  then transported to room #4.  Spinal anesthesia was performed by Anesthesia without difficulty.  The patient was then placed comfortably on the operating table.  Nursing staff inserted a Foley catheter.  Urine was clear.  Tourniquet was then applied to the left thigh.  The left lower extremity, which had been previously identified as appropriate operative site, was then prepped with chlorhexidine scrub and DuraPrep x2 from the tourniquet to the tips of the toes.  Sterile draping was performed.  Time-out was called.  With the left lower extremity elevated, it was Esmarch exsanguinated with a proximal tourniquet at 325 mmHg.  A midline longitudinal incision was then made centered about the patella.  Incision was carried down to subcutaneous tissue.  First layer of capsule was incised in the midline.  A medial parapatellar incision was then made with the Bovie.  The joint was  entered.  There was a clear yellow  joint effusion of about 15 mL.  The patella was everted 180 degrees laterally, the knee flexed to 90 degrees.  There were moderate-size osteophytes along the medial and lateral femoral condyle.  There was a moderate amount of beefy red synovitis.  This was resected.  There was a valgus position of the knee, but I could correct it to neutral.  I measured a standard femoral component.  First bony cut was then made transversely in the proximal tibia with a 7-degree angle of declination using the external tibial guide.  After each bony cut on both femur and tibia, we checked to be sure we had appropriate alignment with the external  alignment jig.  Subsequent cuts were then made on the femur using the standard femoral jig.  Flexion and extension gaps appeared to be equal at about 10 mm.  Lamina spreader was then placed along the medial and lateral femoral condyle, and I removed medial and lateral menisci, ACL, and PCL.  I also removed osteophytes from the posterior femoral  condyle with a 3/4-inch curved osteotome.  There were no loose bodies.  MCL and LCL remained intact throughout the procedure.  A 4-degree distal femoral valgus cut was then made using the 10 mm jig.  The finishing cut was then made to obtain tapering cuts in the center hole.  Retractor then placed about the tibia was advanced anteriorly and measured a #3 tibial tray.  This was pinned in place.  There was some minimal depression along the medial tibial plateau, probably 3-4 mm.  We subsequently filled this in with  methacrylate.  With the tibial jig in place, we applied the 10 mm polyethylene bridging bearing.  There  was slight opening medially with valgus stress, and we then applied a 12.5 mm bridging bearing.  This provided perfect stability in varus and valgus  positioning as well as full extension and flexion well over 120 degrees.  Patella was then prepared by removing about 8 mm of bone leaving 12.5 mm of patella  thickness.  Three holes were then made with the trial jig.  Trial patella was applied and reduced, and through a full range of motion remained stable.  Trial components were then removed.  The joint was copiously irrigated with saline solution.  Final components were then impacted with polymethyl methacrylate.  We initially applied the #3 tibial tray and removed any extraneous methacrylate from its periphery.  We trialed a 10 mm polyethylene bearing but felt that the 12.5 gave [place] better  stability, so then we inserted the final 12.5 mm polyethylene bridging bearing and then the cemented femoral component.  Any further extraneous methacrylate was removed after reduction and placing the knee in extension.  I used a separate package of methacrylate from the patella which was applied with the patellar clamp.  After maturation of the methacrylate, the joint was then inspected.  There was no further extraneous methacrylate.  It was irrigated with saline  solution.  The tourniquet was deflated.  We applied topical tranexamic acid under compression and then bovied any remaining bleeding.  I thought we had nice hemostasis.  The deep capsule was closed with a running #1 Ethibond suture.  There was a partial avulsion of the patella tendon from the patella at the midline.  We inserted a single fiber anchor with excellent apposition of the tendon into the patella.  The  remainder of it was intact.  We finished the deep capsule closure with a running 0 Ethibond suture.  The wound was irrigated.  The remaining wound was then closed with Vicryl and Monocryl.  Skin was closed with skin clips.  A sterile bulky dressing was applied.  The patient tolerated the procedure without complications.  LN/NUANCE  D:06/14/2019 T:06/14/2019 JOB:[id number]

## 2019-06-14 NOTE — Anesthesia Procedure Notes (Signed)
Spinal  Patient location during procedure: OR Start time: 06/14/2019 7:27 AM End time: 06/14/2019 7:30 AM Staffing Anesthesiologist: Lillia Abed, MD Performed: anesthesiologist  Preanesthetic Checklist Completed: patient identified, surgical consent, pre-op evaluation, timeout performed, IV checked, risks and benefits discussed and monitors and equipment checked Spinal Block Patient position: sitting Prep: DuraPrep Patient monitoring: heart rate, cardiac monitor, continuous pulse ox and blood pressure Approach: right paramedian Location: L3-4 Injection technique: single-shot Needle Needle type: Pencan  Needle gauge: 24 G Needle length: 9 cm Needle insertion depth: 7 cm

## 2019-06-14 NOTE — Op Note (Signed)
PATIENT ID:      Jodi Duffy  MRN:     [id number] DOB/AGE:    [birth year] / 68 y.o.       OPERATIVE REPORT    DATE OF PROCEDURE:  06/14/2019       PREOPERATIVE DIAGNOSIS:END STAGE   LEFT KNEE OSTEOARTHRITIS                                                       Estimated body mass index is 26.22 kg/m as calculated from the following:   Height as of this encounter: 5\' 3"  (1.6 m).   Weight as of this encounter: 67.1 kg.     POSTOPERATIVE DIAGNOSIS: END STAGE  LEFT KNEE OSTEOARTHRITIS                                                                     Estimated body mass index is 26.22 kg/m as calculated from the following:   Height as of this encounter: 5\' 3"  (1.6 m).   Weight as of this encounter: 67.1 kg.     PROCEDURE:  Procedure(s): LEFT TOTAL KNEE ARTHROPLASTY      SURGEON:  Joni Fears, MD    ASSISTANT:   Biagio Borg, PA-C   (Present and scrubbed throughout the case, critical for assistance with exposure, retraction, instrumentation, and closure.)          ANESTHESIA: regional, spinal and IV sedation     DRAINS: none :      TOURNIQUET TIME:  Total Tourniquet Time Documented: Thigh (Left) - 84 minutes Total: Thigh (Left) - 84 minutes     COMPLICATIONS:  None   CONDITION:  stable  PROCEDURE IN DETAIL: [name] 06/14/2019, 9:48 AM

## 2019-06-14 NOTE — Anesthesia Preprocedure Evaluation (Signed)
Anesthesia Evaluation  Patient identified by MRN, date of birth, ID band Patient awake    Reviewed: Allergy & Precautions, NPO status , Patient's Chart, lab work & pertinent test results  Airway Mallampati: I  TM Distance: >3 FB Neck ROM: Full    Dental   Pulmonary former smoker,    Pulmonary exam normal        Cardiovascular hypertension, Pt. on medications Normal cardiovascular exam     Neuro/Psych Depression    GI/Hepatic   Endo/Other    Renal/GU      Musculoskeletal   Abdominal   Peds  Hematology   Anesthesia Other Findings   Reproductive/Obstetrics                             Anesthesia Physical Anesthesia Plan  ASA: II  Anesthesia Plan: Spinal   Post-op Pain Management:  Regional for Post-op pain   Induction: Intravenous  PONV Risk Score and Plan: 2 and Ondansetron and Treatment may vary due to age or medical condition  Airway Management Planned: Simple Face Mask  Additional Equipment:   Intra-op Plan:   Post-operative Plan:   Informed Consent: I have reviewed the patients History and Physical, chart, labs and discussed the procedure including the risks, benefits and alternatives for the proposed anesthesia with the patient or authorized representative who has indicated his/her understanding and acceptance.       Plan Discussed with: CRNA and Surgeon  Anesthesia Plan Comments:         Anesthesia Quick Evaluation

## 2019-06-14 NOTE — Evaluation (Signed)
Physical Therapy Evaluation Patient Details Name: Jodi Duffy MRN: [id number] DOB: [birth year] Today's Date: 06/14/2019   History of Present Illness  L TKA; PWB 50%  Clinical Impression  Pt is s/p TKA resulting in the deficits listed below (see PT Problem List). Supine to sit with min assist. Pt sat edge of bed x 5 minutes and became lightheaded and nauseous so returned to supine. BP 82/57, HR 54; then up to 95/67 after 2 minutes supine rest. Good progress expected. Pt reports she tends to, "pass out with pain".  Pt will benefit from skilled PT to increase their independence and safety with mobility to allow discharge to the venue listed below.      Follow Up Recommendations Follow surgeon's recommendation for DC plan and follow-up therapies    Equipment Recommendations  None recommended by PT    Recommendations for Other Services       Precautions / Restrictions Precautions Precautions: Fall;Knee Restrictions Weight Bearing Restrictions: Yes LLE Weight Bearing: Partial weight bearing LLE Partial Weight Bearing Percentage or Pounds: 50%      Mobility  Bed Mobility Overal bed mobility: Needs Assistance Bed Mobility: Supine to Sit;Sit to Supine     Supine to sit: Min assist Sit to supine: Min assist   General bed mobility comments: min A to support LLE; pt became nauseous and dizzy sitting at edge of bed so returned to supine, BP 82/57, HR 54. After 2 minutes supine rest BP up to 95/67.  Transfers                    Ambulation/Gait                Stairs            Wheelchair Mobility    Modified Rankin (Stroke Patients Only)       Balance Overall balance assessment: Needs assistance Sitting-balance support: Feet supported Sitting balance-Leahy Scale: Fair                                       Pertinent Vitals/Pain Pain Assessment: 0-10 Pain Score: 3  Pain Location: L knee Pain Descriptors / Indicators: Sore Pain  Intervention(s): Limited activity within patient's tolerance;Monitored during session;Premedicated before session;Ice applied    Home Living Family/patient expects to be discharged to:: Private residence Living Arrangements: Spouse/significant other Available Help at Discharge: Family;Available 24 hours/day   Home Access: Stairs to enter Entrance Stairs-Rails: None Entrance Stairs-Number of Steps: 1 Home Layout: One level Home Equipment: Walker - 2 wheels;Bedside commode      Prior Function Level of Independence: Independent               Hand Dominance        Extremity/Trunk Assessment   Upper Extremity Assessment Upper Extremity Assessment: Overall WFL for tasks assessed    Lower Extremity Assessment Lower Extremity Assessment: LLE deficits/detail LLE Deficits / Details: 3/5 SLR, knee AAROM 10-35* limited by pain, knee ext +2/5 LLE Sensation: WNL    Cervical / Trunk Assessment Cervical / Trunk Assessment: Normal  Communication   Communication: No difficulties  Cognition Arousal/Alertness: Awake/alert Behavior During Therapy: WFL for tasks assessed/performed Overall Cognitive Status: Within Functional Limits for tasks assessed  General Comments      Exercises Total Joint Exercises Ankle Circles/Pumps: AROM;20 reps;Both Heel Slides: AAROM;Left;5 reps;Supine Straight Leg Raises: AROM;Left(x 1 rep) Long Arc Quad: AROM;Left;5 reps;Seated Goniometric ROM: 10-35* AAROM   Assessment/Plan    PT Assessment Patient needs continued PT services  PT Problem List Decreased strength;Decreased range of motion;Decreased activity tolerance;Pain;Decreased mobility;Decreased knowledge of use of DME;Cardiopulmonary status limiting activity       PT Treatment Interventions DME instruction;Gait training;Stair training;Functional mobility training;Therapeutic exercise;Therapeutic activities;Patient/family education     PT Goals (Current goals can be found in the Care Plan section)  Acute Rehab PT Goals Patient Stated Goal: go home to her dog PT Goal Formulation: With patient Time For Goal Achievement: 06/21/19    Frequency 7X/week   Barriers to discharge        Co-evaluation               AM-PAC PT "6 Clicks" Mobility  Outcome Measure Help needed turning from your back to your side while in a flat bed without using bedrails?: A Little Help needed moving from lying on your back to sitting on the side of a flat bed without using bedrails?: A Little Help needed moving to and from a bed to a chair (including a wheelchair)?: A Lot Help needed standing up from a chair using your arms (e.g., wheelchair or bedside chair)?: A Lot Help needed to walk in hospital room?: A Lot Help needed climbing 3-5 steps with a railing? : A Lot 6 Click Score: 14    End of Session   Activity Tolerance: Patient limited by pain;Treatment limited secondary to medical complications (Comment)(nausea, hypotension) Patient left: in bed;with call bell/phone within reach;with nursing/sitter in room Nurse Communication: Mobility status(pt requests nausea medication) PT Visit Diagnosis: Pain;Difficulty in walking, not elsewhere classified (R26.2);Muscle weakness (generalized) (M62.81) Pain - Right/Left: Left Pain - part of body: Knee    Time: 1435-1455 PT Time Calculation (min) (ACUTE ONLY): 20 min   Charges:   PT Evaluation $PT Eval Low Complexity: 1 Low          [name] PT 06/14/2019  Acute Rehabilitation Services Pager [phone] Office [phone]

## 2019-06-14 NOTE — Transfer of Care (Signed)
Immediate Anesthesia Transfer of Care Note  Patient: Jodi Duffy  Procedure(s) Performed: LEFT TOTAL KNEE ARTHROPLASTY (Left Knee)  Patient Location: PACU  Anesthesia Type:Spinal  Level of Consciousness: awake, alert , oriented and patient cooperative  Airway & Oxygen Therapy: Patient Spontanous Breathing and Patient connected to face mask oxygen  Post-op Assessment: Report given to RN  Post vital signs: stable  Last Vitals:  Vitals Value Taken Time  BP    Temp    Pulse    Resp    SpO2      Last Pain:  Vitals:   06/14/19 0612  TempSrc: Oral      Patients Stated Pain Goal: 5 (56/25/63 8937)  Complications: No apparent anesthesia complications

## 2019-06-14 NOTE — Anesthesia Postprocedure Evaluation (Signed)
Anesthesia Post Note  Patient: Jodi Duffy  Procedure(s) Performed: LEFT TOTAL KNEE ARTHROPLASTY (Left Knee)     Patient location during evaluation: PACU Anesthesia Type: Spinal Level of consciousness: oriented and awake and alert Pain management: pain level controlled Vital Signs Assessment: post-procedure vital signs reviewed and stable Respiratory status: spontaneous breathing, respiratory function stable and patient connected to nasal cannula oxygen Cardiovascular status: blood pressure returned to baseline and stable Postop Assessment: no headache, no backache and no apparent nausea or vomiting Anesthetic complications: no    Last Vitals:  Vitals:   06/14/19 1100 06/14/19 1154  BP: 139/84 (!) 156/77  Pulse: 71 75  Resp: 17 12  Temp: (!) 36.4 C 36.4 C  SpO2: 99% 100%    Last Pain:  Vitals:   06/14/19 1154  TempSrc: Oral  PainSc:                  Keamber Macfadden DAVID

## 2019-06-14 NOTE — Anesthesia Procedure Notes (Signed)
Anesthesia Regional Block: Adductor canal block   Pre-Anesthetic Checklist: ,, timeout performed, Correct Patient, Correct Site, Correct Laterality, Correct Procedure, Correct Position, site marked, Risks and benefits discussed,  Surgical consent,  Pre-op evaluation,  At surgeon's request and post-op pain management  Laterality: Left  Prep: chloraprep       Needles:  Injection technique: Single-shot  Needle Type: Echogenic Stimulator Needle     Needle Length: 9cm  Needle Gauge: 21     Additional Needles:   Narrative:  Start time: 06/14/2019 7:00 AM End time: 06/14/2019 7:10 AM Injection made incrementally with aspirations every 5 mL.  Performed by: Personally  Anesthesiologist: Lillia Abed, MD  Additional Notes: Monitors applied. Patient sedated. Sterile prep and drape,hand hygiene and sterile gloves were used. Relevant anatomy identified.Needle position confirmed.Local anesthetic injected incrementally after negative aspiration. Local anesthetic spread visualized around nerve(s). Vascular puncture avoided. No complications. Image printed for medical record.The patient tolerated the procedure well.    Lillia Abed MD

## 2019-06-14 NOTE — Care Plan (Signed)
Ortho Bundle Case Management Note  Patient Details  Name: Jodi Duffy MRN: [id number] Date of Birth: [birth year]                 DME Arranged:  CPM has been pre-arranged by office surgery scheduler- [employer];  Patient has no other DME needs; She verbalized she has a FWW and 3 in 1 DME Agency:  [facility] for home CPM  HH Arranged:  PT [place]:  [facility] (now [facility])    [place] met with patient while in office for her [place] with Biagio Borg, PA-C, to review the Ortho Bundle program. Patient is scheduled for surgery on 06/14/2019 for a Left total knee replacement. She has a spouse that will be available to help at home after discharge from the hospital. Home CPM has been ordered through [facility]. Patient has all other DME needed post-surgery. [place] [facility] PT at discharge. 2 week follow up appointment scheduled for 06/28/2019 with Dr. Durward Fortes.  Additional Comments: Please contact me with any questions of if this plan should need to change.  Jamse Arn, RN, BSN, [employer]  [phone] 06/14/2019, 5:17 PM

## 2019-06-14 NOTE — H&P (Signed)
The recent History & Physical has been reviewed. I have personally examined the patient today. There is no interval change to the documented History & Physical. The patient would like to proceed with the procedure.  Garald Balding 06/14/2019,  7:10 AM

## 2019-06-15 ENCOUNTER — Encounter (HOSPITAL_COMMUNITY): Payer: Self-pay | Admitting: Orthopaedic Surgery

## 2019-06-15 LAB — BASIC METABOLIC PANEL
Anion gap: 8 (ref 5–15)
BUN: 14 mg/dL (ref 8–23)
CO2: 24 mmol/L (ref 22–32)
Calcium: 7.8 mg/dL — ABNORMAL LOW (ref 8.9–10.3)
Chloride: 105 mmol/L (ref 98–111)
Creatinine, Ser: 0.6 mg/dL (ref 0.44–1.00)
GFR calc Af Amer: >60 mL/min (ref >60)
GFR calc non Af Amer: >60 mL/min (ref >60)
Glucose, Bld: 121 mg/dL — ABNORMAL HIGH (ref 70–99)
Potassium: 3.7 mmol/L (ref 3.5–5.1)
Sodium: 137 mmol/L (ref 135–145)

## 2019-06-15 LAB — CBC
HCT: 23.9 % — ABNORMAL LOW (ref 36.0–46.0)
Hemoglobin: 7.8 g/dL — ABNORMAL LOW (ref 12.0–15.0)
MCH: 30.8 pg (ref 26.0–34.0)
MCHC: 32.6 g/dL (ref 30.0–36.0)
MCV: 94.5 fL (ref 80.0–100.0)
Platelets: 193 10*3/uL (ref 150–400)
RBC: 2.53 MIL/uL — ABNORMAL LOW (ref 3.87–5.11)
RDW: 14.6 % (ref 11.5–15.5)
WBC: 7.5 10*3/uL (ref 4.0–10.5)
nRBC: 0 % (ref 0.0–0.2)

## 2019-06-15 MED ORDER — ASPIRIN 81 MG PO CHEW: 81.0000 mg | CHEWABLE_TABLET | Freq: Two times a day (BID) | ORAL | Status: DC

## 2019-06-15 MED ORDER — ONDANSETRON HCL 4 MG PO TABS: 4.0000 mg | ORAL_TABLET | Freq: Three times a day (TID) | ORAL | 0 refills | Status: DC | PRN

## 2019-06-15 MED ORDER — OXYCODONE HCL 5 MG PO TABS: 5.0000 mg | ORAL_TABLET | ORAL | 0 refills | Status: DC | PRN

## 2019-06-15 MED ORDER — METHOCARBAMOL 500 MG PO TABS: 500.0000 mg | ORAL_TABLET | Freq: Three times a day (TID) | ORAL | 0 refills | Status: DC | PRN

## 2019-06-15 NOTE — Op Note (Signed)
PATIENT ID: Jodi Duffy        MRN:  [id number]          DOB/AGE: [birth year] / 68 y.o.    Joni Fears, MD   Biagio Borg, PA-C [address] [place], [place]  [address]                             [phone]   PROGRESS NOTE  Subjective:  negative for Chest Pain  negative for Shortness of Breath  positive for Nausea/Vomiting with pain meds  negative for Calf Pain    Tolerating Diet: yes         Patient reports pain as moderate.     Difficult night with pain better this am  Objective: Vital signs in last 24 hours:    Patient Vitals for the past 24 hrs:  BP Temp Temp src Pulse Resp SpO2  06/15/19 0651 124/68 98.2 F (36.8 C) - 85 16 99 %  06/15/19 0152 108/71 98.6 F (37 C) - 84 16 96 %  06/14/19 2053 116/67 98.3 F (36.8 C) - 84 16 97 %  06/14/19 1746 128/75 97.9 F (36.6 C) Oral 87 20 96 %  06/14/19 1312 137/89 - - 90 16 100 %  06/14/19 1154 (!) 156/77 97.6 F (36.4 C) Oral 75 12 100 %  06/14/19 1100 139/84 (!) 97.5 F (36.4 C) - 71 17 99 %  06/14/19 1045 137/82 - - 78 15 99 %  06/14/19 1030 136/79 - - 72 14 99 %  06/14/19 1015 121/77 (!) 97.4 F (36.3 C) - 74 11 100 %      Intake/Output from previous day:   07/28 0701 - 07/29 0700 In: 4058 [P.O.:720; I.V.:3138] Out: 2300 [Urine:2000]   Intake/Output this shift:   No intake/output data recorded.   Intake/Output      07/28 0701 - 07/29 0700 07/29 0701 - 07/30 0700   P.O. 720    I.V. (mL/kg) 3138 (46.8)    Other 0    IV Piggyback 200    Total Intake(mL/kg) 4058 (60.5)    Urine (mL/kg/hr) 2000 (1.2)    Blood 300    Total Output 2300    Net +1758            LABORATORY DATA: Recent Labs    06/13/19 1022 06/15/19 0516  WBC 4.2 7.5  HGB 12.3 7.8*  HCT 37.5 23.9*  PLT 268 193   Recent Labs    06/13/19 1022 06/15/19 0516  NA 140 137  K 4.5 3.7  CL 101 105  CO2 28 24  BUN 19 14  CREATININE 0.58 0.60  GLUCOSE 123* 121*  CALCIUM 9.4 7.8*   Lab Results  Component Value Date   INR 1.0 06/13/2019   INR 0.93 06/12/2015   INR 1.04 08/05/2012    Recent Radiographic Studies :  Dg Chest 2 View  Result Date: 06/13/2019 CLINICAL DATA:  preop knee surgery for tomorrow, no chest complaints EXAM: CHEST - 2 VIEW COMPARISON:  10/20/2016 FINDINGS: Heart size is normal. There is mild atherosclerotic calcification of the thoracic aorta. Lungs are free of focal consolidations and pleural effusions. Remote rib fractures. IMPRESSION: No evidence for acute cardiopulmonary abnormality. Electronically Signed   By: Nolon Nations M.D.   On: 06/13/2019 15:17     Examination:  General appearance: alert, cooperative and no distress  Wound Exam: clean, dry, intact -no active drainage  Drainage:  None: wound tissue dry  Motor Exam: EHL, FHL, Anterior Tibial and Posterior Tibial Intact  Sensory Exam: Superficial Peroneal, Deep Peroneal and Tibial normal  Vascular Exam: Normal  Assessment:    1 Day Post-Op  Procedure(s) (LRB): LEFT TOTAL KNEE ARTHROPLASTY (Left)  ADDITIONAL DIAGNOSIS:  Active Problems:   Total knee replacement status, left  Acute Blood Loss Anemia- asymptomatic   Plan: Physical Therapy as ordered Partial Weight Bearing @ 50% (PWB)  DVT Prophylaxis:  Aspirin and TED hose  DISCHARGE PLAN: Home  DISCHARGE NEEDS: HHPT, CPM, Walker and 3-in-1 comode seat   Patient's anticipated LOS is less than 2 midnights, meeting these requirements: - Younger than 79 - Lives within 1 hour of care - Has a competent adult at home to recover with post-op recover - NO history of  - Chronic pain requiring opiods  - Diabetes  - Coronary Artery Disease  - Heart failure  - Heart attack  - Stroke  - DVT/VTE  - Cardiac arrhythmia  - Respiratory Failure/COPD  - Renal failure  - Anemia  - Advanced Liver disease  anxious for discharge, foley out, no present nausea. Dressing changed to aquacel with no active drainage. Will discharge today with ASA 325mg  qd, robaxin,  oxycodone and zofran. Office 2 weeks          Biagio Borg, [place]  06/15/2019 7:36 AM

## 2019-06-15 NOTE — Progress Notes (Addendum)
Physical Therapy Treatment Patient Details Name: Jodi Duffy MRN: [id number] DOB: [birth year] Today's Date: 06/15/2019    History of Present Illness L TKA; PWB 50%    PT Comments    Patient continues to progress gradually with therapy. She was able to transfer supine<>sit with supervision with [facility] elevated and demonstrated good carry-over with sit<>stand transfer technique but continues to require min assist to initiate stand. Patient was educated on stair mobility for reverse step-up with RW to be able to enter her home safely. She demonstrated good sequencing with minimal verbal cuing after demonstration and was able to ambulate ~ 6 feet to [facility]. Patient ended session in bed and was instructed in seated and supine exercises for knee ROM. She remained greatly limited by pain this session and has been unable to ambulate more than 8 feet without min assist. She will benefit from additional skilled Acute PT to progress gait and mobility in preparation for safe discharge home. At this time she is not safe to discharge home as she would need to be able to ambulate 30+ feet to get to her bedroom in her home.   Follow Up Recommendations  Follow surgeon's recommendation for DC plan and follow-up therapies     Equipment Recommendations  None recommended by PT    Recommendations for Other Services       Precautions / Restrictions Precautions Precautions: Fall;Knee Restrictions Weight Bearing Restrictions: Yes LLE Weight Bearing: Partial weight bearing LLE Partial Weight Bearing Percentage or Pounds: 50%    Mobility  Bed Mobility Overal bed mobility: Needs Assistance Bed Mobility: Supine to Sit     Supine to sit: Supervision;HOB elevated Sit to supine: Supervision;HOB elevated   General bed mobility comments: pt sitting up in bed and able to scoot LE's to EOB and place feet on floor  Transfers Overall transfer level: Needs assistance Equipment used: Rolling walker (2  wheeled) Transfers: Sit to/from Stand Sit to Stand: Min assist;From elevated surface         General transfer comment: pt with good recal for hand placement for sit<>stand transfer and able to sequence at EOB and from [facility], min assist to initiate stand, pt primarily using Rt LE for lift and support as instructed  Ambulation/Gait Ambulation/Gait assistance: Min assist Gait Distance (Feet): 6 Feet Assistive device: Rolling walker (2 wheeled) Gait Pattern/deviations: Step-to pattern;Decreased step length - left;Decreased stance time - left;Decreased step length - right;Decreased stride length;Antalgic;Decreased weight shift to left Gait velocity: slow, labored, and antalgic   General Gait Details: cues for pt to maintain close proximety to RW to provide good position for UE support, pt with step to pattern to reduce WB on Lt LE   Stairs Stairs: Yes Stairs assistance: Min guard Stair Management: With walker;Backwards Number of Stairs: 1 General stair comments: pt performed reverse step up, with RW, stepping back with Rt LE to bring Lt up, pt able to maintain balance while raising RW onto platform and while lowering it onto ground for step down.   Wheelchair Mobility    Modified Rankin (Stroke Patients Only)       Balance Overall balance assessment: Needs assistance Sitting-balance support: Feet supported Sitting balance-Leahy Scale: Good Sitting balance - Comments: pt able to sit EOB and scoot/wegith shift with no LOB   Standing balance support: Bilateral upper extremity supported;During functional activity Standing balance-Leahy Scale: Fair Standing balance comment: pt remains reliant on UE support for balance durin gdynamic standing and gait  Cognition Arousal/Alertness: Awake/alert Behavior During Therapy: WFL for tasks assessed/performed Overall Cognitive Status: Within Functional Limits for tasks assessed               Exercises Total Joint  Exercises Ankle Circles/Pumps: AROM;5 reps;Seated;Right;Left Quad Sets: AROM;5 reps;Left;Supine Heel Slides: AAROM;5 reps;Left;Supine Hip ABduction/ADduction: AROM;Left;5 reps;Supine Long Arc Quad: AROM;Left;5 reps;Seated(pt unable to move through full range) Knee Flexion: AROM;Left;5 reps;Seated Goniometric ROM: 10-40*    General Comments        Pertinent Vitals/Pain Pain Assessment: 0-10 Pain Score: 4  Pain Location: L knee Pain Descriptors / Indicators: Sore Pain Intervention(s): Limited activity within patient's tolerance;Monitored during session;Ice applied           PT Goals (current goals can now be found in the care plan section) Acute Rehab PT Goals Patient Stated Goal: go home to her dog PT Goal Formulation: With patient Time For Goal Achievement: 06/21/19 Progress towards PT goals: Progressing toward goals    Frequency    7X/week      PT Plan Current plan remains appropriate    Co-evaluation              AM-PAC PT "6 Clicks" Mobility   Outcome Measure  Help needed turning from your back to your side while in a flat bed without using bedrails?: A Little Help needed moving from lying on your back to sitting on the side of a flat bed without using bedrails?: A Little Help needed moving to and from a bed to a chair (including a wheelchair)?: A Little Help needed standing up from a chair using your arms (e.g., wheelchair or bedside chair)?: A Little Help needed to walk in hospital room?: A Little Help needed climbing 3-5 steps with a railing? : A Little 6 Click Score: 18    End of Session Equipment Utilized During Treatment: Gait belt Activity Tolerance: Patient limited by pain;Patient tolerated treatment well Patient left: in bed;with call bell/phone within reach;with bed alarm set Nurse Communication: Mobility status PT Visit Diagnosis: Pain;Difficulty in walking, not elsewhere classified (R26.2);Muscle weakness (generalized) (M62.81) Pain -  Right/Left: Left Pain - part of body: Knee     Time: 1242-1309 PT Time Calculation (min) (ACUTE ONLY): 27 min  Charges:  $Gait Training: 8-22 mins $Therapeutic Exercise: 8-22 mins                      Kipp Brood, PT, DPT, [facility] Physical Therapist with [facility]  06/15/2019 1:27 PM

## 2019-06-15 NOTE — Progress Notes (Addendum)
Physical Therapy Treatment Patient Details Name: Jodi Duffy MRN: [id number] DOB: Jul 17, 1951 Today's Date: 06/15/2019    History of Present Illness L TKA; PWB 50%    PT Comments    Patient is progressing gradually in therapy. She demonstrated improved independent with bed mobility and was able to initiate standing and gait training today. Pt's BP was measured and remained stable throughout mobility (see below). She required min assist for sit<>stand transfers and short bout of gait. Patient remains greatly limited by Lt knee pain and nausea with 1 episode of emesis this session. She was unable to participate in stair training during this AM session. She will benefit from additional Acute PT session this afternoon to complete mobility and training and educate on HEP. Will determine this afternoon if patient will be safe to discharge home.   Follow Up Recommendations  Follow surgeon's recommendation for DC plan and follow-up therapies     Equipment Recommendations  None recommended by PT    Recommendations for Other Services       Precautions / Restrictions Precautions Precautions: Fall;Knee Restrictions Weight Bearing Restrictions: Yes LLE Weight Bearing: Partial weight bearing LLE Partial Weight Bearing Percentage or Pounds: 50%    Mobility  Bed Mobility Overal bed mobility: Needs Assistance Bed Mobility: Supine to Sit     Supine to sit: Supervision     General bed mobility comments: pt progressing bed mobility and able to sit up form flat bed and scoot LE's to EOB and place feet on floor  Transfers Overall transfer level: Needs assistance Equipment used: Rolling walker (2 wheeled) Transfers: Sit to/from Stand Sit to Stand: Min assist;From elevated surface         General transfer comment: pt required slightly elevated bed and min assist to initiate stand, cues for hand placement  Ambulation/Gait Ambulation/Gait assistance: Min assist Gait Distance (Feet):  8 Feet Assistive device: Rolling walker (2 wheeled) Gait Pattern/deviations: Step-to pattern;Decreased step length - left;Decreased stance time - left;Decreased step length - right;Decreased stride length;Antalgic;Decreased weight shift to left Gait velocity: slow, labored, and antalgic   General Gait Details: pt required cues to maintain decreased WB on Lt LE initially, pt limited by pain in Lt knee and nausea with ambulation.   Stairs             Wheelchair Mobility    Modified Rankin (Stroke Patients Only)       Balance Overall balance assessment: Needs assistance Sitting-balance support: Feet supported Sitting balance-Leahy Scale: Good Sitting balance - Comments: pt able to sit EOB and scoot/wegith shift with no LOB   Standing balance support: Bilateral upper extremity supported;During functional activity Standing balance-Leahy Scale: Fair Standing balance comment: pt reliant on UE support for dynamic standing activities, able to maintain standign balance with no UE support while holding blue bag throughout episode of emesis                            Cognition Arousal/Alertness: Awake/alert Behavior During Therapy: WFL for tasks assessed/performed Overall Cognitive Status: Within Functional Limits for tasks assessed                     Exercises      General Comments        Pertinent Vitals/Pain Pain Assessment: 0-10 Pain Score: 4  Pain Location: L knee Pain Descriptors / Indicators: Sore Pain Intervention(s): Limited activity within patient's tolerance;Monitored during session;Ice applied    Home  Living                      Prior Function            PT Goals (current goals can now be found in the care plan section) Acute Rehab PT Goals Patient Stated Goal: go home to her dog PT Goal Formulation: With patient Time For Goal Achievement: 06/21/19 Progress towards PT goals: Progressing toward goals    Frequency     7X/week      PT Plan Current plan remains appropriate    Co-evaluation              AM-PAC PT "6 Clicks" Mobility   Outcome Measure  Help needed turning from your back to your side while in a flat bed without using bedrails?: A Little Help needed moving from lying on your back to sitting on the side of a flat bed without using bedrails?: A Little Help needed moving to and from a bed to a chair (including a wheelchair)?: A Little Help needed standing up from a chair using your arms (e.g., wheelchair or bedside chair)?: A Little Help needed to walk in hospital room?: A Little Help needed climbing 3-5 steps with a railing? : A Lot 6 Click Score: 17    End of Session Equipment Utilized During Treatment: Gait belt Activity Tolerance: Patient limited by pain;Treatment limited secondary to medical complications (Comment)(nausea and emesis) Patient left: in chair;with call bell/phone within reach Nurse Communication: Mobility status PT Visit Diagnosis: Pain;Difficulty in walking, not elsewhere classified (R26.2);Muscle weakness (generalized) (M62.81) Pain - Right/Left: Left Pain - part of body: Knee     Time: 0712-1975 PT Time Calculation (min) (ACUTE ONLY): 26 min  Charges:  $Gait Training: 8-22 mins $Therapeutic Activity: 8-22 mins                     Kipp Brood, PT, DPT, [facility] Physical Therapist with [facility]  06/15/2019 10:40 AM

## 2019-06-15 NOTE — Discharge Summary (Addendum)
Joni Fears, MD   Biagio Borg, PA-C [address] [place], [place]  [address]                             [phone]  PATIENT ID: Jodi Duffy        MRN:  [id number]          DOB/AGE: 70-23-1952 / 70 y.o.    DISCHARGE SUMMARY  ADMISSION DATE:    06/14/2019 DISCHARGE DATE:   06/16/2019   ADMISSION DIAGNOSIS: LEFT KNEE OSTEOARTHRITIS    DISCHARGE DIAGNOSIS:  LEFT KNEE OSTEOARTHRITIS    ADDITIONAL DIAGNOSIS: Active Problems:   Total knee replacement status, left  Past Medical History:  Diagnosis Date  . Allergy    RHINITIS  . Arthritis   . Depression   . Glaucoma   . Hyperlipidemia   . Hypertension   . Osteopenia   . TMJ syndrome     PROCEDURE: Procedure(s): LEFT TOTAL KNEE ARTHROPLASTY  on 06/14/2019  CONSULTS: none    HISTORY: Jodi Duffy, 68 y.o. female, has a history of pain and functional disability in the left knee due to arthritis and has failed non-surgical conservative treatments for greater than 12 weeks to includeNSAID's and/or analgesics, corticosteriod injections, viscosupplementation injections, flexibility and strengthening excercises and activity modification.  Onset of symptoms was gradual, starting 2 years ago with gradually worsening course since that time. The patient noted no past surgery on the left knee(s).  Patient currently rates pain in the left knee(s) at 8 out of 10 with activity. Patient has night pain, worsening of pain with activity and weight bearing, pain that interferes with activities of daily living, crepitus and joint swelling.  Patient has evidence of subchondral cysts, subchondral sclerosis, periarticular osteophytes and joint space narrowing by imaging studies.   HOSPITAL COURSE:  Jodi Duffy is a 68 y.o. admitted on 06/14/2019 and found to have a diagnosis of LEFT KNEE OSTEOARTHRITIS.  After appropriate laboratory studies were obtained  they were taken to the operating room on 06/14/2019 and underwent   Procedure(s): LEFT TOTAL KNEE ARTHROPLASTY  .   They were given perioperative antibiotics:  Anti-infectives (From admission, onward)   Start     Dose/Rate Route Frequency Ordered Stop   06/14/19 1400  ceFAZolin (ANCEF) IVPB 2g/100 mL premix     2 g 200 mL/hr over 30 Minutes Intravenous Every 6 hours 06/14/19 1226 06/14/19 2120   06/14/19 0602  ceFAZolin (ANCEF) 2-4 GM/100ML-% IVPB    Note to Pharmacy: Randa Evens  : cabinet override      06/14/19 0602 06/14/19 1230   06/14/19 0600  ceFAZolin (ANCEF) IVPB 2g/100 mL premix     2 g 200 mL/hr over 30 Minutes Intravenous On call to O.R. 06/14/19 0258 06/14/19 0731    .  Tolerated the procedure well.  Placed with a foley intraoperatively.     Toradol was given post op.  POD #1, allowed out of bed to a chair.  PT for ambulation and exercise program.  Foley D/C'd in morning.  IV saline locked.  O2 discontionued. Had some problems with nausea.  POD #2, symptoms improved.  The remainder of the hospital course was dedicated to ambulation and strengthening.   The patient was discharged on 2 Days Post-Op in  Stable condition.  Blood products given:none  DIAGNOSTIC STUDIES: Recent vital signs:  Patient Vitals for the past 24 hrs:  BP Temp Temp src Pulse Resp  SpO2  06/16/19 1314 111/68 98.3 F (36.8 C) Oral (!) 103 17 95 %  06/16/19 0441 (!) 141/78 98.8 F (37.1 C) Oral 100 18 92 %  06/15/19 1937 (!) 144/69 98.2 F (36.8 C) Oral 88 19 95 %       Recent laboratory studies: Recent Labs    06/13/19 1022 06/15/19 0516 06/16/19 0524  WBC 4.2 7.5 7.8  HGB 12.3 7.8* 8.2*  HCT 37.5 23.9* 25.3*  PLT 268 193 192   Recent Labs    06/13/19 1022 06/15/19 0516 06/16/19 0524  NA 140 137 135  K 4.5 3.7 3.3*  CL 101 105 99  CO2 28 24 27   BUN 19 14 8   CREATININE 0.58 0.60 0.52  GLUCOSE 123* 121* 140*  CALCIUM 9.4 7.8* 8.4*   Lab Results  Component Value Date   INR 1.0 06/13/2019   INR 0.93 06/12/2015   INR 1.04 08/05/2012      Recent Radiographic Studies :  Dg Chest 2 View  Result Date: 06/13/2019 CLINICAL DATA:  preop knee surgery for tomorrow, no chest complaints EXAM: CHEST - 2 VIEW COMPARISON:  10/20/2016 FINDINGS: Heart size is normal. There is mild atherosclerotic calcification of the thoracic aorta. Lungs are free of focal consolidations and pleural effusions. Remote rib fractures. IMPRESSION: No evidence for acute cardiopulmonary abnormality. Electronically Signed   By: Nolon Nations M.D.   On: 06/13/2019 15:17    DISCHARGE INSTRUCTIONS: Discharge Instructions    CPM   Complete by: As directed    Continuous passive motion machine (CPM):      Use the CPM from 0 to 45 degrees for 6-8 hours per day.      Do not increase range of motion.  Max 45 degrees till seen at first postop visit.      You may break it up into 2 or 3 sessions per day.      Use CPM for 3-4 weeks or until you are told to stop.   CPM   Complete by: As directed    Continuous passive motion machine (CPM):      Use the CPM from 0 to 30 degrees for 6-8 hours per day.      You may increase by 5-10 per day to max of 45 degrees till follow up in office.  You may break it up into 2 or 3 sessions per day.      Use CPM for 3-4 weeks or until you are told to stop.   Call MD / Call 911   Complete by: As directed    If you experience chest pain or shortness of breath, CALL 911 and be transported to the hospital emergency room.  If you develope a fever above 101 F, pus (white drainage) or increased drainage or redness at the wound, or calf pain, call your surgeon's office.   Call MD / Call 911   Complete by: As directed    If you experience chest pain or shortness of breath, CALL 911 and be transported to the hospital emergency room.  If you develope a fever above 101 F, pus (white drainage) or increased drainage or redness at the wound, or calf pain, call your surgeon's office.   Change dressing   Complete by: As directed    DO NOT CHANGE YOUR  DRESSING.   Change dressing   Complete by: As directed    DO NOT CHANGE YOUR DRESSING.   Constipation Prevention   Complete by: As directed  Drink plenty of fluids.  Prune juice may be helpful.  You may use a stool softener, such as Colace (over the counter) 100 mg twice a day.  Use MiraLax (over the counter) for constipation as needed.   Constipation Prevention   Complete by: As directed    Drink plenty of fluids.  Prune juice may be helpful.  You may use a stool softener, such as Colace (over the counter) 100 mg twice a day.  Use MiraLax (over the counter) for constipation as needed.   Diet general   Complete by: As directed    Diet general   Complete by: As directed    Discharge instructions   Complete by: As directed    INSTRUCTIONS AFTER JOINT REPLACEMENT   Remove items at home which could result in a fall. This includes throw rugs or furniture in walking pathways ICE to the affected joint every three hours while awake for 30 minutes at a time, for at least the first 3-5 days, and then as needed for pain and swelling.  Continue to use ice for pain and swelling. You may notice swelling that will progress down to the foot and ankle.  This is normal after surgery.  Elevate your leg when you are not up walking on it.   Continue to use the breathing machine you got in the hospital (incentive spirometer) which will help keep your temperature down.  It is common for your temperature to cycle up and down following surgery, especially at night when you are not up moving around and exerting yourself.  The breathing machine keeps your lungs expanded and your temperature down.   DIET:  As you were doing prior to hospitalization, we recommend a well-balanced diet.  DRESSING / WOUND CARE / SHOWERING  Keep the surgical dressing until follow up.  The dressing is water proof, so you can shower without any extra covering.  IF THE DRESSING FALLS OFF or the wound gets wet inside, change the dressing  with sterile gauze.  Please use good hand washing techniques before changing the dressing.  Do not use any lotions or creams on the incision until instructed by your surgeon.    ACTIVITY  Increase activity slowly as tolerated, but follow the weight bearing instructions below.   No driving for 6 weeks or until further direction given by your physician.  You cannot drive while taking narcotics.  No lifting or carrying greater than 10 lbs. until further directed by your surgeon. Avoid periods of inactivity such as sitting longer than an hour when not asleep. This helps prevent blood clots.  You may return to work once you are authorized by your doctor.     WEIGHT BEARING   Partial weight bearing with assist device as directed.  50%   EXERCISES  Results after joint replacement surgery are often greatly improved when you follow the exercise, range of motion and muscle strengthening exercises prescribed by your doctor. Safety measures are also important to protect the joint from further injury. Any time any of these exercises cause you to have increased pain or swelling, decrease what you are doing until you are comfortable again and then slowly increase them. If you have problems or questions, call your caregiver or physical therapist for advice.   Rehabilitation is important following a joint replacement. After just a few days of immobilization, the muscles of the leg can become weakened and shrink (atrophy).  These exercises are designed to build up the tone and strength of  the thigh and leg muscles and to improve motion. Often times heat used for twenty to thirty minutes before working out will loosen up your tissues and help with improving the range of motion but do not use heat for the first two weeks following surgery (sometimes heat can increase post-operative swelling).   These exercises can be done on a training (exercise) mat,  on a table or on a bed. Use whatever works the best and is  most comfortable for you.    Use music or television while you are exercising so that the exercises are a pleasant break in your day. This will make your life better with the exercises acting as a break in your routine that you can look forward to.   Perform all exercises about fifteen times, three times per day or as directed.  You should exercise both the operative leg and the other leg as well.   Exercises include:  Quad Sets - Tighten up the muscle on the front of the thigh (Quad) and hold for 5-10 seconds.   Straight Leg Raises - With your knee straight (if you were given a brace, keep it on), lift the leg to 60 degrees, hold for 3 seconds, and slowly lower the leg.  Perform this exercise against resistance later as your leg gets stronger.  Leg Slides: Lying on your back, slowly slide your foot toward your buttocks, bending your knee up off the floor (only go as far as is comfortable). Then slowly slide your foot back down until your leg is flat on the floor again.  Angel Wings: Lying on your back spread your legs to the side as far apart as you can without causing discomfort.  Hamstring Strength:  Lying on your back, push your heel against the floor with your leg straight by tightening up the muscles of your buttocks.  Repeat, but this time bend your knee to a comfortable angle, and push your heel against the floor.  You may put a pillow under the heel to make it more comfortable if necessary.   A rehabilitation program following joint replacement surgery can speed recovery and prevent re-injury in the future due to weakened muscles. Contact your doctor or a physical therapist for more information on knee rehabilitation.    CONSTIPATION  Constipation is defined medically as fewer than three stools per week and severe constipation as less than one stool per week.  Even if you have a regular bowel pattern at home, your normal regimen is likely to be disrupted due to multiple reasons following  surgery.  Combination of anesthesia, postoperative narcotics, change in appetite and fluid intake all can affect your bowels.   YOU MUST use at least one of the following options; they are listed in order of increasing strength to get the job done.  They are all available over the counter, and you may need to use some, POSSIBLY even all of these options:    Drink plenty of fluids (prune juice may be helpful) and high fiber foods Colace 100 mg by mouth twice a day  Senokot for constipation as directed and as needed Dulcolax (bisacodyl), take with full glass of water  Miralax (polyethylene glycol) once or twice a day as needed.  If you have tried all these things and are unable to have a bowel movement in the first 3-4 days after surgery call either your surgeon or your primary doctor.    If you experience loose stools or diarrhea, hold the medications  until you stool forms back up.  If your symptoms do not get better within 1 week or if they get worse, check with your doctor.  If you experience "the worst abdominal pain ever" or develop nausea or vomiting, please contact the office immediately for further recommendations for treatment.   ITCHING:  If you experience itching with your medications, try taking only a single pain pill, or even half a pain pill at a time.  You can also use Benadryl over the counter for itching or also to help with sleep.   TED HOSE STOCKINGS:  Use stockings on both legs until for at least 2 weeks or as directed by physician office. They may be removed at night for sleeping.  MEDICATIONS:  See your medication summary on the "After Visit Summary" that nursing will review with you.  You may have some home medications which will be placed on hold until you complete the course of blood thinner medication.  It is important for you to complete the blood thinner medication as prescribed.  PRECAUTIONS:  If you experience chest pain or shortness of breath - call 911 immediately  for transfer to the hospital emergency department.   If you develop a fever greater that 101 F, purulent drainage from wound, increased redness or drainage from wound, foul odor from the wound/dressing, or calf pain - CONTACT YOUR SURGEON.                                                   FOLLOW-UP APPOINTMENTS:  If you do not already have a post-op appointment, please call the office for an appointment to be seen by your surgeon.  Guidelines for how soon to be seen are listed in your "After Visit Summary", but are typically between 1-4 weeks after surgery.  OTHER INSTRUCTIONS:   Knee Replacement:  Do not place pillow under knee, focus on keeping the knee straight while resting. CPM instructions: 0-90 degrees, 2 hours in the morning, 2 hours in the afternoon, and 2 hours in the evening. Place foam block, curve side up under heel at all times except when in CPM or when walking.  DO NOT modify, tear, cut, or change the foam block in any way.  MAKE SURE YOU:  Understand these instructions.  Get help right away if you are not doing well or get worse.    Thank you for letting us be a part of your medical care team.  It is a privilege we respect greatly.  We hope these instructions will help you stay on track for a fast and full recovery!   Discharge instructions   Complete by: As directed    INSTRUCTIONS AFTER JOINT REPLACEMENT   Remove items at home which could result in a fall. This includes throw rugs or furniture in walking pathways ICE to the affected joint every three hours while awake for 30 minutes at a time, for at least the first 3-5 days, and then as needed for pain and swelling.  Continue to use ice for pain and swelling. You may notice swelling that will progress down to the foot and ankle.  This is normal after surgery.  Elevate your leg when you are not up walking on it.   Continue to use the breathing machine you got in the hospital (incentive spirometer) which will help keep your  temperature down.  It is common for your temperature to cycle up and down following surgery, especially at night when you are not up moving around and exerting yourself.  The breathing machine keeps your lungs expanded and your temperature down.   DIET:  As you were doing prior to hospitalization, we recommend a well-balanced diet.  DRESSING / WOUND CARE / SHOWERING  Keep the surgical dressing until follow up.  The dressing is water proof, so you can shower without any extra covering.  IF THE DRESSING FALLS OFF or the wound gets wet inside, change the dressing with sterile gauze.  Please use good hand washing techniques before changing the dressing.  Do not use any lotions or creams on the incision until instructed by your surgeon.    ACTIVITY  Increase activity slowly as tolerated, but follow the weight bearing instructions below.   No driving for 6 weeks or until further direction given by your physician.  You cannot drive while taking narcotics.  No lifting or carrying greater than 10 lbs. until further directed by your surgeon. Avoid periods of inactivity such as sitting longer than an hour when not asleep. This helps prevent blood clots.  You may return to work once you are authorized by your doctor.     WEIGHT BEARING   Partial weight bearing with assist device as directed.  50%   EXERCISES  Results after joint replacement surgery are often greatly improved when you follow the exercise, range of motion and muscle strengthening exercises prescribed by your doctor. Safety measures are also important to protect the joint from further injury. Any time any of these exercises cause you to have increased pain or swelling, decrease what you are doing until you are comfortable again and then slowly increase them. If you have problems or questions, call your caregiver or physical therapist for advice.   Rehabilitation is important following a joint replacement. After just a few days of  immobilization, the muscles of the leg can become weakened and shrink (atrophy).  These exercises are designed to build up the tone and strength of the thigh and leg muscles and to improve motion. Often times heat used for twenty to thirty minutes before working out will loosen up your tissues and help with improving the range of motion but do not use heat for the first two weeks following surgery (sometimes heat can increase post-operative swelling).   These exercises can be done on a training (exercise) mat,  on a table or on a bed. Use whatever works the best and is most comfortable for you.    Use music or television while you are exercising so that the exercises are a pleasant break in your day. This will make your life better with the exercises acting as a break in your routine that you can look forward to.   Perform all exercises about fifteen times, three times per day or as directed.  You should exercise both the operative leg and the other leg as well.   Exercises include:  Quad Sets - Tighten up the muscle on the front of the thigh (Quad) and hold for 5-10 seconds.   Straight Leg Raises - With your knee straight (if you were given a brace, keep it on), lift the leg to 60 degrees, hold for 3 seconds, and slowly lower the leg.  Perform this exercise against resistance later as your leg gets stronger.  Leg Slides: Lying on your back, slowly slide your foot toward your buttocks,  bending your knee up off the floor (only go as far as is comfortable). Then slowly slide your foot back down until your leg is flat on the floor again.  Angel Wings: Lying on your back spread your legs to the side as far apart as you can without causing discomfort.  Hamstring Strength:  Lying on your back, push your heel against the floor with your leg straight by tightening up the muscles of your buttocks.  Repeat, but this time bend your knee to a comfortable angle, and push your heel against the floor.  You may put a  pillow under the heel to make it more comfortable if necessary.   A rehabilitation program following joint replacement surgery can speed recovery and prevent re-injury in the future due to weakened muscles. Contact your doctor or a physical therapist for more information on knee rehabilitation.    CONSTIPATION  Constipation is defined medically as fewer than three stools per week and severe constipation as less than one stool per week.  Even if you have a regular bowel pattern at home, your normal regimen is likely to be disrupted due to multiple reasons following surgery.  Combination of anesthesia, postoperative narcotics, change in appetite and fluid intake all can affect your bowels.   YOU MUST use at least one of the following options; they are listed in order of increasing strength to get the job done.  They are all available over the counter, and you may need to use some, POSSIBLY even all of these options:    Drink plenty of fluids (prune juice may be helpful) and high fiber foods Colace 100 mg by mouth twice a day  Senokot for constipation as directed and as needed Dulcolax (bisacodyl), take with full glass of water  Miralax (polyethylene glycol) once or twice a day as needed.  If you have tried all these things and are unable to have a bowel movement in the first 3-4 days after surgery call either your surgeon or your primary doctor.    If you experience loose stools or diarrhea, hold the medications until you stool forms back up.  If your symptoms do not get better within 1 week or if they get worse, check with your doctor.  If you experience "the worst abdominal pain ever" or develop nausea or vomiting, please contact the office immediately for further recommendations for treatment.   ITCHING:  If you experience itching with your medications, try taking only a single pain pill, or even half a pain pill at a time.  You can also use Benadryl over the counter for itching or also to help  with sleep.   TED HOSE STOCKINGS:  Use stockings on both legs until for at least 2 weeks or as directed by physician office. They may be removed at night for sleeping.  MEDICATIONS:  See your medication summary on the "After Visit Summary" that nursing will review with you.  You may have some home medications which will be placed on hold until you complete the course of blood thinner medication.  It is important for you to complete the blood thinner medication as prescribed.  PRECAUTIONS:  If you experience chest pain or shortness of breath - call 911 immediately for transfer to the hospital emergency department.   If you develop a fever greater that 101 F, purulent drainage from wound, increased redness or drainage from wound, foul odor from the wound/dressing, or calf pain - CONTACT YOUR SURGEON.  FOLLOW-UP APPOINTMENTS:  If you do not already have a post-op appointment, please call the office for an appointment to be seen by your surgeon.  Guidelines for how soon to be seen are listed in your "After Visit Summary", but are typically between 1-4 weeks after surgery.  OTHER INSTRUCTIONS:   Knee Replacement:  Do not place pillow under knee, focus on keeping the knee straight while resting. CPM instructions: 0-90 degrees, 2 hours in the morning, 2 hours in the afternoon, and 2 hours in the evening. Place foam block, curve side up under heel at all times except when in CPM or when walking.  DO NOT modify, tear, cut, or change the foam block in any way.  MAKE SURE YOU:  Understand these instructions.  Get help right away if you are not doing well or get worse.    Thank you for letting us be a part of your medical care team.  It is a privilege we respect greatly.  We hope these instructions will help you stay on track for a fast and full recovery!   Do not put a pillow under the knee. Place it under the heel.   Complete by: As directed    Do not  put a pillow under the knee. Place it under the heel.   Complete by: As directed    Driving restrictions   Complete by: As directed    No driving for 6 weeks   Driving restrictions   Complete by: As directed    No driving for 6 weeks   Increase activity slowly as tolerated   Complete by: As directed    Increase activity slowly as tolerated   Complete by: As directed    Lifting restrictions   Complete by: As directed    No lifting for 6 weeks   Lifting restrictions   Complete by: As directed    No lifting for 6 weeks   Partial weight bearing   Complete by: As directed    % Body Weight: 50%   Laterality: left   Extremity: Lower   Partial weight bearing   Complete by: As directed    % Body Weight: 50%   Laterality: left   Extremity: Lower   Patient may shower   Complete by: As directed    You may shower over your dressing   Patient may shower   Complete by: As directed    You may shower over your dressing   TED hose   Complete by: As directed    Use stockings (TED hose) for 3 weeks on left  leg.  You may remove them at night for sleeping.   TED hose   Complete by: As directed    Use stockings (TED hose) for 3 weeks on left  leg.  You may remove them at night for sleeping.      DISCHARGE MEDICATIONS:   Allergies as of 06/16/2019   No Known Allergies     Medication List    STOP taking these medications   sulfamethoxazole-trimethoprim 800-160 MG tablet Commonly known as: BACTRIM DS     TAKE these medications   aspirin 81 MG chewable tablet Chew 1 tablet (81 mg total) by mouth 2 (two) times daily.   atorvastatin 10 MG tablet Commonly known as: LIPITOR TAKE 1 TABLET(10 MG) BY MOUTH DAILY What changed: See the new instructions.   cholecalciferol 1000 units tablet Commonly known as: VITAMIN D Take 1,000 Units by mouth daily.   Combigan 0.2-0.5 %  ophthalmic solution Generic drug: brimonidine-timolol Place 1 drop into both eyes 2 (two) times a day.   Lumigan  0.01 % Soln Generic drug: bimatoprost Place 1 drop into both eyes daily.   methocarbamol 500 MG tablet Commonly known as: ROBAXIN Take 1 tablet (500 mg total) by mouth every 8 (eight) hours as needed for muscle spasms.   multivitamin with minerals tablet Take 1 tablet by mouth daily. Women's Multivitamin 50+   olmesartan-hydrochlorothiazide 40-12.5 MG tablet Commonly known as: BENICAR HCT Take 1 tablet by mouth daily.   ondansetron 4 MG tablet Commonly known as: ZOFRAN Take 1 tablet (4 mg total) by mouth every 8 (eight) hours as needed for nausea or vomiting.   oxyCODONE 5 MG immediate release tablet Commonly known as: Oxy IR/ROXICODONE Take 1-2 tablets (5-10 mg total) by mouth every 4 (four) hours as needed for moderate pain (pain score 4-6).   venlafaxine XR 150 MG 24 hr capsule Commonly known as: EFFEXOR-XR TAKE 1 CAPSULE(150 MG) BY MOUTH DAILY WITH BREAKFAST What changed: See the new instructions.            Durable Medical Equipment  (From admission, onward)         Start     Ordered   06/14/19 1227  DME Walker rolling  Once    Question:  Patient needs a walker to treat with the following condition  Answer:  Total knee replacement status, left   06/14/19 1226   06/14/19 1227  DME 3 n 1  Once     06/14/19 1226   06/14/19 1227  DME Bedside commode  Once    Question:  Patient needs a bedside commode to treat with the following condition  Answer:  Total knee replacement status, left   06/14/19 1226           Discharge Care Instructions  (From admission, onward)         Start     Ordered   06/16/19 0000  Partial weight bearing    Question Answer Comment  % Body Weight 50%   Laterality left   Extremity Lower      06/16/19 1512   06/16/19 0000  Change dressing    Comments: DO NOT CHANGE YOUR DRESSING.   06/16/19 1512   06/15/19 0000  Partial weight bearing    Question Answer Comment  % Body Weight 50%   Laterality left   Extremity Lower       06/15/19 0857   06/15/19 0000  Change dressing    Comments: DO NOT CHANGE YOUR DRESSING.   06/15/19 0857          FOLLOW UP VISIT:   Follow-up Information    Garald Balding, MD. Go on 06/28/2019.   Specialty: Orthopedic Surgery Why: At 1:00 pm with Dr. Durward Fortes for initial post-op appointment. Contact information: [address] [place] [address] [phone]        Home, [facility] Go on 06/15/2019.   Specialty: Home Health Services Why: You will be receiving 5 home health Physical Therapy visits; Someone will be in contact with you from the home health agency to set up your in home evaluation. Contact information: [address] [place] [address] [phone]        Medicine, [facility]. Go on 06/29/2019.   Why: at 11:00 am for your Outpatient Physical Therapy appointment. Contact information: [place] [address] [phone]  DISPOSITION:   Home  CONDITION:  Stable   Aaron Edelman D. Mariane Masters [facility] [phone]  06/16/2019 3:15 PM

## 2019-06-16 LAB — CBC
HCT: 25.3 % — ABNORMAL LOW (ref 36.0–46.0)
Hemoglobin: 8.2 g/dL — ABNORMAL LOW (ref 12.0–15.0)
MCH: 30 pg (ref 26.0–34.0)
MCHC: 32.4 g/dL (ref 30.0–36.0)
MCV: 92.7 fL (ref 80.0–100.0)
Platelets: 192 10*3/uL (ref 150–400)
RBC: 2.73 MIL/uL — ABNORMAL LOW (ref 3.87–5.11)
RDW: 14.6 % (ref 11.5–15.5)
WBC: 7.8 10*3/uL (ref 4.0–10.5)
nRBC: 0 % (ref 0.0–0.2)

## 2019-06-16 LAB — BASIC METABOLIC PANEL
Anion gap: 9 (ref 5–15)
BUN: 8 mg/dL (ref 8–23)
CO2: 27 mmol/L (ref 22–32)
Calcium: 8.4 mg/dL — ABNORMAL LOW (ref 8.9–10.3)
Chloride: 99 mmol/L (ref 98–111)
Creatinine, Ser: 0.52 mg/dL (ref 0.44–1.00)
GFR calc Af Amer: >60 mL/min (ref >60)
GFR calc non Af Amer: >60 mL/min (ref >60)
Glucose, Bld: 140 mg/dL — ABNORMAL HIGH (ref 70–99)
Potassium: 3.3 mmol/L — ABNORMAL LOW (ref 3.5–5.1)
Sodium: 135 mmol/L (ref 135–145)

## 2019-06-16 NOTE — Progress Notes (Addendum)
Physical Therapy Treatment Patient Details Name: Jodi Duffy MRN: [id number] DOB: 1951/05/31 Today's Date: 06/16/2019    History of Present Illness L TKA; PWB 50%    PT Comments    Patient progressing well with improved carry over for safe hand placement on RW with transfers and gait. Pt able to recall safe step up strategies for reverse step up to platform. HEP reviewed and patient demonstrated understanding of seated exercises and verbalized understanding of supine exercises. She has demonstrated safe mobility techniques to be able to return home with follow up therapies. Acute PT will follow through duration of stay to progress mobility as able.   Follow Up Recommendations  Follow surgeon's recommendation for DC plan and follow-up therapies     Equipment Recommendations  None recommended by PT    Recommendations for Other Services       Precautions / Restrictions Precautions Precautions: Fall;Knee Restrictions Weight Bearing Restrictions: Yes LLE Weight Bearing: Partial weight bearing LLE Partial Weight Bearing Percentage or Pounds: 50%    Mobility  Bed Mobility                  Transfers Overall transfer level: Needs assistance Equipment used: Rolling walker (2 wheeled) Transfers: Sit to/from Stand Sit to Stand: Supervision         General transfer comment: sit to stand from bedside recliner, pt with good recal and no cues needed for hand placement  Ambulation/Gait Ambulation/Gait assistance: Min guard Gait Distance (Feet): 20 Feet Assistive device: Rolling walker (2 wheeled) Gait Pattern/deviations: Step-to pattern;Decreased step length - left;Decreased stance time - left;Decreased step length - right;Decreased stride length;Antalgic;Decreased weight shift to left Gait velocity: slow and antalgic   General Gait Details: pt with good carry over for safe use of RW and hand placement during gait.   Stairs Stairs: Yes Stairs assistance:  Supervision Stair Management: With walker;Backwards Number of Stairs: 1 General stair comments: pt with good recall for reverse step up technique with RW   Wheelchair Mobility    Modified Rankin (Stroke Patients Only)       Balance Overall balance assessment: Needs assistance Sitting-balance support: Feet supported Sitting balance-Leahy Scale: Good Sitting balance - Comments: pt able to sit EOB and scoot/wegith shift with no LOB   Standing balance support: Bilateral upper extremity supported;During functional activity Standing balance-Leahy Scale: Fair Standing balance comment: pt continues to require UE support with RW for standing and gait                            Cognition Arousal/Alertness: Awake/alert Behavior During Therapy: WFL for tasks assessed/performed Overall Cognitive Status: Within Functional Limits for tasks assessed                       Exercises Total Joint Exercises Ankle Circles/Pumps: AROM;5 reps;Seated;Right;Left Long Arc Quad: AROM;Left;5 reps;Seated Knee Flexion: AROM;Left;5 reps;Seated    General Comments        Pertinent Vitals/Pain Pain Assessment: 0-10 Pain Score: 3  Pain Location: L knee Pain Descriptors / Indicators: Sore Pain Intervention(s): Limited activity within patient's tolerance;Monitored during session;Ice applied           PT Goals (current goals can now be found in the care plan section) Acute Rehab PT Goals Patient Stated Goal: go home to her dog PT Goal Formulation: With patient Time For Goal Achievement: 06/21/19 Progress towards PT goals: Progressing toward goals    Frequency  7X/week      PT Plan Current plan remains appropriate       AM-PAC PT "6 Clicks" Mobility   Outcome Measure  Help needed turning from your back to your side while in a flat bed without using bedrails?: A Little Help needed moving from lying on your back to sitting on the side of a flat bed without using  bedrails?: A Little Help needed moving to and from a bed to a chair (including a wheelchair)?: A Little Help needed standing up from a chair using your arms (e.g., wheelchair or bedside chair)?: A Little Help needed to walk in hospital room?: A Little Help needed climbing 3-5 steps with a railing? : A Little 6 Click Score: 18    End of Session Equipment Utilized During Treatment: Gait belt Activity Tolerance: Patient limited by pain;Patient tolerated treatment well Patient left: with call bell/phone within reach;in chair Nurse Communication: Mobility status PT Visit Diagnosis: Pain;Difficulty in walking, not elsewhere classified (R26.2);Muscle weakness (generalized) (M62.81) Pain - Right/Left: Left Pain - part of body: Knee     Time: 2633-3545 PT Time Calculation (min) (ACUTE ONLY): 26 min  Charges:  $Gait Training: 8-22 mins $Therapeutic Exercise: 8-22 mins                     Kipp Brood, PT, DPT, [facility] Physical Therapist with [facility]  06/16/2019 6:08 PM

## 2019-06-16 NOTE — Plan of Care (Signed)
  Problem: Activity: Goal: Risk for activity intolerance will decrease Outcome: Progressing   Problem: Elimination: Goal: Will not experience complications related to urinary retention Outcome: Progressing   Problem: Pain Managment: Goal: General experience of comfort will improve Outcome: Progressing   Problem: Activity: Goal: Range of joint motion will improve Outcome: Progressing   Problem: Pain Management: Goal: Pain level will decrease with appropriate interventions Outcome: Progressing

## 2019-06-16 NOTE — Progress Notes (Signed)
Physical Therapy Treatment Patient Details Name: Jodi Duffy MRN: [id number] DOB: 1951-01-19 Today's Date: 06/16/2019    History of Present Illness L TKA; PWB 50%    PT Comments    Patient much improved today with reduced pain and increased tolerance to WB activity. Patient able to ambulate 2 bouts of 40' with RW and min guard with mild increase in knee pain. Pt has greatest pain with knee flexion during stand to sit tranfers and was educated to scoot LT foot forward to prevent excessive knee felxion. She ended session in bedside recliner with LE's elevated. Exercises reviewed for quad sets and heel slides with gait belt for assistance. Patient will benefit from additional therapy session to ensure safe sequencing for stair mobility and use of RW. Acute PT will follow up this PM.   Follow Up Recommendations  Follow surgeon's recommendation for DC plan and follow-up therapies     Equipment Recommendations  None recommended by PT    Recommendations for Other Services       Precautions / Restrictions Precautions Precautions: Fall;Knee Restrictions Weight Bearing Restrictions: Yes LLE Weight Bearing: Partial weight bearing LLE Partial Weight Bearing Percentage or Pounds: 50%    Mobility  Bed Mobility Overal bed mobility: Needs Assistance Bed Mobility: Supine to Sit     Supine to sit: Supervision;HOB elevated     General bed mobility comments: pt sitting up in bed and able to scoot LE's to EOB and place feet on floor, slow movement  Transfers Overall transfer level: Needs assistance Equipment used: Rolling walker (2 wheeled) Transfers: Sit to/from Stand Sit to Stand: Min guard         General transfer comment: pt with good recall for hand placement for sit<>stand transfer and able to sequence at EOB and bedside recliner  Ambulation/Gait Ambulation/Gait assistance: Min guard Gait Distance (Feet): 80 Feet(seated rest break half way) Assistive device: Rolling  walker (2 wheeled) Gait Pattern/deviations: Step-to pattern;Decreased step length - left;Decreased stance time - left;Decreased step length - right;Decreased stride length;Antalgic;Decreased weight shift to left Gait velocity: slow and antalgic   General Gait Details: pt with good carry over for safe use of RW and hand placement during gait.   Stairs             Wheelchair Mobility    Modified Rankin (Stroke Patients Only)       Balance Overall balance assessment: Needs assistance Sitting-balance support: Feet supported Sitting balance-Leahy Scale: Good Sitting balance - Comments: pt able to sit EOB and scoot/wegith shift with no LOB   Standing balance support: Bilateral upper extremity supported;During functional activity Standing balance-Leahy Scale: Fair Standing balance comment: pt able to don face mask in static standing without support, pt requires UE support for dynamic standing/gait             Cognition Arousal/Alertness: Awake/alert Behavior During Therapy: WFL for tasks assessed/performed Overall Cognitive Status: Within Functional Limits for tasks assessed                     Exercises Total Joint Exercises Ankle Circles/Pumps: AROM;5 reps;Seated;Right;Left Quad Sets: AROM;5 reps;Left;Seated(LE elevated in recliner) Heel Slides: AAROM;5 reps;Left;Seated(LE's elevated in recliner with gait belt for AAROM)    General Comments        Pertinent Vitals/Pain Pain Assessment: 0-10 Pain Score: 2  Pain Location: L knee Pain Descriptors / Indicators: Sore Pain Intervention(s): Limited activity within patient's tolerance;Monitored during session;Premedicated before session;Ice applied  PT Goals (current goals can now be found in the care plan section) Acute Rehab PT Goals Patient Stated Goal: go home to her dog PT Goal Formulation: With patient Time For Goal Achievement: 06/21/19 Progress towards PT goals: Progressing toward  goals    Frequency    7X/week      PT Plan Current plan remains appropriate       AM-PAC PT "6 Clicks" Mobility   Outcome Measure  Help needed turning from your back to your side while in a flat bed without using bedrails?: A Little Help needed moving from lying on your back to sitting on the side of a flat bed without using bedrails?: A Little Help needed moving to and from a bed to a chair (including a wheelchair)?: A Little Help needed standing up from a chair using your arms (e.g., wheelchair or bedside chair)?: A Little Help needed to walk in hospital room?: A Little Help needed climbing 3-5 steps with a railing? : A Little 6 Click Score: 18    End of Session Equipment Utilized During Treatment: Gait belt Activity Tolerance: Patient limited by pain;Patient tolerated treatment well Patient left: with call bell/phone within reach;in chair(no chair alarm on wall, tray table place in front of pt) Nurse Communication: Mobility status PT Visit Diagnosis: Pain;Difficulty in walking, not elsewhere classified (R26.2);Muscle weakness (generalized) (M62.81) Pain - Right/Left: Left Pain - part of body: Knee     Time: 6979-4801 PT Time Calculation (min) (ACUTE ONLY): 28 min  Charges:  $Gait Training: 8-22 mins $Therapeutic Exercise: 8-22 mins                     Kipp Brood, PT, DPT, [facility] Physical Therapist with [facility]  06/16/2019 11:59 AM  '

## 2019-06-17 ENCOUNTER — Telehealth: Payer: Self-pay | Admitting: *Deleted

## 2019-06-17 NOTE — Care Plan (Signed)
RNCM spoke with patient today for discharge call to check her status. She states she is still in pain, but doing well since discharge late yesterday afternoon. She reports she is using the home CPM machine and has not yet heard from therapy. RNCM did speak with [facility] liaison and he confirmed someone has reached out to her to schedule, but will call back to confirm initial appointment either this afternoon or tomorrow. Reminded patient of how to contact office or RNCM for any questions or needs. She was appreciative of call. Will continue to check status next week.

## 2019-06-17 NOTE — Telephone Encounter (Signed)
Ortho Bundle D/C call completed.

## 2019-06-18 DIAGNOSIS — Z7982 Long term (current) use of aspirin: Secondary | ICD-10-CM | POA: Diagnosis not present

## 2019-06-18 DIAGNOSIS — Z96653 Presence of artificial knee joint, bilateral: Secondary | ICD-10-CM | POA: Diagnosis not present

## 2019-06-18 DIAGNOSIS — E785 Hyperlipidemia, unspecified: Secondary | ICD-10-CM | POA: Diagnosis not present

## 2019-06-18 DIAGNOSIS — H409 Unspecified glaucoma: Secondary | ICD-10-CM | POA: Diagnosis not present

## 2019-06-18 DIAGNOSIS — I10 Essential (primary) hypertension: Secondary | ICD-10-CM | POA: Diagnosis not present

## 2019-06-18 DIAGNOSIS — L720 Epidermal cyst: Secondary | ICD-10-CM | POA: Diagnosis not present

## 2019-06-18 DIAGNOSIS — Z87891 Personal history of nicotine dependence: Secondary | ICD-10-CM | POA: Diagnosis not present

## 2019-06-18 DIAGNOSIS — F329 Major depressive disorder, single episode, unspecified: Secondary | ICD-10-CM | POA: Diagnosis not present

## 2019-06-18 DIAGNOSIS — F341 Dysthymic disorder: Secondary | ICD-10-CM | POA: Diagnosis not present

## 2019-06-18 DIAGNOSIS — M8580 Other specified disorders of bone density and structure, unspecified site: Secondary | ICD-10-CM | POA: Diagnosis not present

## 2019-06-18 DIAGNOSIS — Z471 Aftercare following joint replacement surgery: Secondary | ICD-10-CM | POA: Diagnosis not present

## 2019-06-21 DIAGNOSIS — Z471 Aftercare following joint replacement surgery: Secondary | ICD-10-CM | POA: Diagnosis not present

## 2019-06-21 DIAGNOSIS — E785 Hyperlipidemia, unspecified: Secondary | ICD-10-CM | POA: Diagnosis not present

## 2019-06-21 DIAGNOSIS — F329 Major depressive disorder, single episode, unspecified: Secondary | ICD-10-CM | POA: Diagnosis not present

## 2019-06-21 DIAGNOSIS — I10 Essential (primary) hypertension: Secondary | ICD-10-CM | POA: Diagnosis not present

## 2019-06-21 DIAGNOSIS — H409 Unspecified glaucoma: Secondary | ICD-10-CM | POA: Diagnosis not present

## 2019-06-21 DIAGNOSIS — M8580 Other specified disorders of bone density and structure, unspecified site: Secondary | ICD-10-CM | POA: Diagnosis not present

## 2019-06-22 DIAGNOSIS — M8580 Other specified disorders of bone density and structure, unspecified site: Secondary | ICD-10-CM | POA: Diagnosis not present

## 2019-06-22 DIAGNOSIS — E785 Hyperlipidemia, unspecified: Secondary | ICD-10-CM | POA: Diagnosis not present

## 2019-06-22 DIAGNOSIS — F329 Major depressive disorder, single episode, unspecified: Secondary | ICD-10-CM | POA: Diagnosis not present

## 2019-06-22 DIAGNOSIS — H409 Unspecified glaucoma: Secondary | ICD-10-CM | POA: Diagnosis not present

## 2019-06-22 DIAGNOSIS — Z471 Aftercare following joint replacement surgery: Secondary | ICD-10-CM | POA: Diagnosis not present

## 2019-06-22 DIAGNOSIS — I10 Essential (primary) hypertension: Secondary | ICD-10-CM | POA: Diagnosis not present

## 2019-06-23 ENCOUNTER — Ambulatory Visit (INDEPENDENT_AMBULATORY_CARE_PROVIDER_SITE_OTHER): Payer: Medicare Other

## 2019-06-23 ENCOUNTER — Encounter: Payer: Self-pay | Admitting: Orthopaedic Surgery

## 2019-06-23 ENCOUNTER — Telehealth: Payer: Self-pay | Admitting: *Deleted

## 2019-06-23 ENCOUNTER — Other Ambulatory Visit: Payer: Self-pay

## 2019-06-23 ENCOUNTER — Ambulatory Visit (INDEPENDENT_AMBULATORY_CARE_PROVIDER_SITE_OTHER): Payer: Medicare Other | Admitting: Orthopaedic Surgery

## 2019-06-23 VITALS — Ht 63.0 in | Wt 148.0 lb

## 2019-06-23 DIAGNOSIS — G8929 Other chronic pain: Secondary | ICD-10-CM

## 2019-06-23 DIAGNOSIS — M25562 Pain in left knee: Secondary | ICD-10-CM

## 2019-06-23 MED ORDER — OXYCODONE-ACETAMINOPHEN 5-325 MG PO TABS: 1.0000 | ORAL_TABLET | Freq: Four times a day (QID) | ORAL | 0 refills | Status: DC | PRN

## 2019-06-23 NOTE — Care Plan (Signed)
RNCM received telephone call from [facility] Physical Therapy liaison on Wednesday, 06/22/19. He indicated that he was concerned regarding quad strength at 8 days post-op, as his treating therapist felt that the patient was having difficulty performing leg raises or extension on her own. She has good range of motion passively, but cannot lift her own leg and quad strength/firing is minimal. RNCM requested treating therapist call MD to update. He did speak with Dr. Rudene Anda PA, who recommended patient to be seen today, 06/23/19. RNCM attempted to contact patient unsuccessfully on 06/22/19 as well as morning of 06/23/19 for her 1 week post-op call. Met with patient and Dr. Durward Fortes in office today. He indicated that she did have very little to no quadriceps "firing" today, and she could not actively raise the left leg or extend on her own. He did aspirate 20-30 ml of a hematoma from the knee and felt this may be beneficial to help with range of motion. He provided her with a knee immobilizer and would like to extend HHPT through next week instead of transitioning over to OPPT on 06/29/19. She is scheduled for a 1 week f/u on 06/28/19 at 1:00 pm. Update provided to [place], Fritzi Mandes as well. Verbal order provided for additional [facility] therapy.

## 2019-06-23 NOTE — Telephone Encounter (Signed)
Ortho bundle 1 week call completed. 

## 2019-06-23 NOTE — Progress Notes (Signed)
Office Visit Note   Patient: Jodi Duffy           Date of Birth: 18-Jan-1951           MRN: [id number] Visit Date: 06/23/2019              Requested by: Denita Lung, MD [place],  [place] [address] PCP: Denita Lung, MD   Assessment & Plan: Visit Diagnoses:  1. Chronic pain of left knee     Plan: We received a phone call from the physical therapist performing in-home therapy yesterday on [place].  There was a concern about her lack of knee extension actively.  So we had her come to the office a little early.  There is some swelling about the knee consistent with a hematoma and soft tissue swelling.  She has resolving ecchymosis in her calf and her ankle based on her aspirin.  I did aspirate about 30 cc of old blood.  There is no opening with varus or valgus stress there is no calf pain.  She was able unable to extend her knee but I could not even feel her quads contracting.  She does have weakened quads on the other side with a small extensor lag and she is 68 years status post left knee replacement.  I am not sure if there is a problem with her quad mechanism.  At the time of surgery I thought it soft tissues were weak there was a partial avulsion of the soft tissue from the dorsum of the patella which are reattached with a anchor but the quad mechanism without was otherwise intact.  I did not feel any gap in the patella tendon or the quadriceps.  The patella appears to be in normal position on the plain films.  I removed every other stitch and applied a new Aquacel dressing.  I am going to apply a knee immobilizer and have her work on her quads in extension she may weight-bear to tolerance and I will see her next Tuesday.  There is always a possibility exploring the wound if there is been some disruption of the extensor mechanism which I discussed.  We will renew oxycodone Follow-Up Instructions: Return in about 1 week (around 06/30/2019).   Orders:  Orders  Placed This Encounter  Procedures  . XR KNEE 3 VIEW LEFT   Meds ordered this encounter  Medications  . oxyCODONE-acetaminophen (PERCOCET/ROXICET) 5-325 MG tablet    Sig: Take 1-2 tablets by mouth every 6 (six) hours as needed for severe pain.    Dispense:  30 tablet    Refill:  0      Procedures: No procedures performed   Clinical Data: No additional findings.   Subjective: Chief Complaint  Patient presents with  . Left Knee - Routine Post Op    Left total knee arthroplasty DOS 06/14/2019  Patient presents today 9 days out from left total knee arthroplasty. She had her surgery on 06/14/2019.   HPI  Review of Systems   Objective: Vital Signs: Ht 5\' 3"  (1.6 m)   Wt 148 lb (67.1 kg)   BMI 26.22 kg/m   Physical Exam  Ortho Exam awake alert and oriented x3.  Comfortable sitting.  No shortness of breath or chest pain.  No calf pain no distal left leg swelling.  Neurologically intact.  There are areas of resolving ecchymosis from her knee to her ankle.  There were some areas of tenderness but no obvious infection.  The removed every other stitch and applied a new Aquacel dressing.  She was unable to extend her foot off the bed although she had full passive extension she had a very weak quad  Specialty Comments:  No specialty comments available.  Imaging: Xr Knee 3 View Left  Result Date: 06/23/2019 Films of the left total knee replacement were obtained in 3 projections.  Excellent alignment on the AP film without obvious complications or fracture.  Nice glue mantle in both AP and lateral projections,\. there is a lateral patella tilt on the patella view    PMFS History: Patient Active Problem List   Diagnosis Date Noted  . Total knee replacement status, left 06/14/2019  . Bilateral primary osteoarthritis of knee 01/18/2019  . Acute pain of left knee 01/18/2019  . Pain in right elbow 10/18/2018  . Cotton wool spots 09/29/2018  . Epidermoid cyst of skin 03/17/2018  .  Menopause present 03/17/2018  . Essential hypertension 02/10/2018  . Neck pain 05/29/2017  . Other seasonal allergic rhinitis 11/15/2015  . Family history of breast cancer in mother 09/06/2014  . Dysthymia 09/06/2014  . Glaucoma 09/06/2014  . Hyperlipidemia LDL goal <100 09/06/2014  . S/P TKR (total knee replacement) 08/12/2012  . Family history of heart disease in female family member before age 44 07/22/2012   Past Medical History:  Diagnosis Date  . Allergy    RHINITIS  . Arthritis   . Depression   . Glaucoma   . Hyperlipidemia   . Hypertension   . Osteopenia   . TMJ syndrome     Family History  Problem Relation Age of Onset  . Heart disease Mother        valve replacement  . Cancer Mother        breast, throat  . Heart disease Father        died of brain embolism after hip surgery  . Arthritis Father   . Arthritis Brother   . Cancer Maternal Aunt        various cancers  . Diabetes Neg Hx   . Hypertension Neg Hx   . Stroke Neg Hx     Past Surgical History:  Procedure Laterality Date  . BIOPSY BREAST    . BLEPHAROPLASTY    . KNEE ARTHROSCOPY     right knee, 1990 and 2012, Dr. Durward Fortes  . TEMPOROMANDIBULAR JOINT SURGERY    . TONSILLECTOMY    . TOTAL KNEE ARTHROPLASTY  08/10/2012   Procedure: TOTAL KNEE ARTHROPLASTY;  Surgeon: Garald Balding, MD;  Location: [facility];  Service: Orthopedics;  Laterality: Right;  Right Total Knee Arthroplasty  . TOTAL KNEE ARTHROPLASTY Left 06/14/2019   Procedure: LEFT TOTAL KNEE ARTHROPLASTY;  Surgeon: Garald Balding, MD;  Location: [facility];  Service: Orthopedics;  Laterality: Left;   Social History   Occupational History  . Occupation: attorney    Employer: [employer]  Tobacco Use  . Smoking status: Former Smoker    Quit date: 02/10/1980    Years since quitting: 39.3  . Smokeless tobacco: Never Used  Substance and Sexual Activity  . Alcohol use: Yes    Alcohol/week: 1.0 standard drinks    Types: 1 Glasses  of wine per week    Comment: daily  . Drug use: No  . Sexual activity: Yes

## 2019-06-24 DIAGNOSIS — H409 Unspecified glaucoma: Secondary | ICD-10-CM | POA: Diagnosis not present

## 2019-06-24 DIAGNOSIS — F329 Major depressive disorder, single episode, unspecified: Secondary | ICD-10-CM | POA: Diagnosis not present

## 2019-06-24 DIAGNOSIS — Z471 Aftercare following joint replacement surgery: Secondary | ICD-10-CM | POA: Diagnosis not present

## 2019-06-24 DIAGNOSIS — E785 Hyperlipidemia, unspecified: Secondary | ICD-10-CM | POA: Diagnosis not present

## 2019-06-24 DIAGNOSIS — M8580 Other specified disorders of bone density and structure, unspecified site: Secondary | ICD-10-CM | POA: Diagnosis not present

## 2019-06-24 DIAGNOSIS — I10 Essential (primary) hypertension: Secondary | ICD-10-CM | POA: Diagnosis not present

## 2019-06-27 DIAGNOSIS — E785 Hyperlipidemia, unspecified: Secondary | ICD-10-CM | POA: Diagnosis not present

## 2019-06-27 DIAGNOSIS — I10 Essential (primary) hypertension: Secondary | ICD-10-CM | POA: Diagnosis not present

## 2019-06-27 DIAGNOSIS — F329 Major depressive disorder, single episode, unspecified: Secondary | ICD-10-CM | POA: Diagnosis not present

## 2019-06-27 DIAGNOSIS — M8580 Other specified disorders of bone density and structure, unspecified site: Secondary | ICD-10-CM | POA: Diagnosis not present

## 2019-06-27 DIAGNOSIS — Z471 Aftercare following joint replacement surgery: Secondary | ICD-10-CM | POA: Diagnosis not present

## 2019-06-27 DIAGNOSIS — H409 Unspecified glaucoma: Secondary | ICD-10-CM | POA: Diagnosis not present

## 2019-06-28 ENCOUNTER — Encounter: Payer: Self-pay | Admitting: Orthopaedic Surgery

## 2019-06-28 ENCOUNTER — Telehealth: Payer: Self-pay | Admitting: *Deleted

## 2019-06-28 ENCOUNTER — Ambulatory Visit (INDEPENDENT_AMBULATORY_CARE_PROVIDER_SITE_OTHER): Payer: Medicare Other | Admitting: Orthopaedic Surgery

## 2019-06-28 ENCOUNTER — Other Ambulatory Visit: Payer: Self-pay

## 2019-06-28 DIAGNOSIS — Z96652 Presence of left artificial knee joint: Secondary | ICD-10-CM

## 2019-06-28 MED ORDER — OXYCODONE-ACETAMINOPHEN 5-325 MG PO TABS: 1.0000 | ORAL_TABLET | Freq: Three times a day (TID) | ORAL | 0 refills | Status: DC | PRN

## 2019-06-28 NOTE — Telephone Encounter (Signed)
2 week Ortho Bundle call completed. 

## 2019-06-28 NOTE — Care Plan (Signed)
RNCM met with patient and Dr. Durward Fortes for a 2 week post-op appointment. She ambulates into the office with a FWW. She is with her husband. All staples removed today from left knee and steri-strips applied. Incision appears to be healing without complications. She is able to hold her leg in extension off the exam table today with minimal assistance by MD. Swelling and bruising have both decreased. MD, patient and RNCM discussed finishing out this week with HHPT and begin OPPT with [facility] on Monday, 07/04/19 at 9:00 am. Therapy Rx given to patient to take to therapy location. F/U in 1 week with MD to re-examine overall status and status of quadriceps/extensor weakness. Appointment scheduled 07/05/19 at 9:15 am.

## 2019-06-28 NOTE — Progress Notes (Signed)
Office Visit Note   Patient: Jodi Duffy           Date of Birth: 01-13-1951           MRN: [id number] Visit Date: 06/28/2019              Requested by: Denita Lung, MD [address] [place],  [place] [address] PCP: Denita Lung, MD   Assessment & Plan: Visit Diagnoses:  1. Total knee replacement status, left     Plan: 2 weeks status post primary left total knee replacement.  When I saw her last week she had difficulty with her quad function today she is actually able to extend her knee with about a 20 degree extensor lag.  Patella seems to track in the midline.  I remove the remaining staples and applied Steri-Strips over benzoin.  Will finish her course of in-home therapy this week and start outpatient therapy next week.  Will reevaluate in 1 week.  Renew oxycodone Follow-Up Instructions: Return in about 1 week (around 07/05/2019).   Orders:  No orders of the defined types were placed in this encounter.  Meds ordered this encounter  Medications  . oxyCODONE-acetaminophen (PERCOCET/ROXICET) 5-325 MG tablet    Sig: Take 1 tablet by mouth every 8 (eight) hours as needed for severe pain.    Dispense:  30 tablet    Refill:  0    Order Specific Question:   Supervising Provider    Answer:   Garald Balding [[id number]]      Procedures: No procedures performed   Clinical Data: No additional findings.   Subjective: Chief Complaint  Patient presents with  . Left Knee - Routine Post Op    Left total knee arthroplasty DOS 06/14/19  Patient presents today for follow up on her left knee. She is now two weeks out from left total knee arthroplasty. Her surgery was on 06/14/2019. She is taking oxycodone for pain and as often as possible.  Physical therapy notes that she now has active quad function and active extension of her knee  HPI  Review of Systems   Objective: Vital Signs: There were no vitals taken for this visit.  Physical Exam  Ortho Exam left knee  incisions healing without problem.  Remaining staples removed and Steri-Strips applied.  No calf pain.  No distal edema.  I can actually feel her quadriceps contract and she can extend her knee now with about a 20 degree extensor lag.  Last week she could not extend her knee at all.  Swelling is down significantly.  Specialty Comments:  No specialty comments available.  Imaging: No results found.   PMFS History: Patient Active Problem List   Diagnosis Date Noted  . Total knee replacement status, left 06/14/2019  . Bilateral primary osteoarthritis of knee 01/18/2019  . Acute pain of left knee 01/18/2019  . Pain in right elbow 10/18/2018  . Cotton wool spots 09/29/2018  . Epidermoid cyst of skin 03/17/2018  . Menopause present 03/17/2018  . Essential hypertension 02/10/2018  . Neck pain 05/29/2017  . Other seasonal allergic rhinitis 11/15/2015  . Family history of breast cancer in mother 09/06/2014  . Dysthymia 09/06/2014  . Glaucoma 09/06/2014  . Hyperlipidemia LDL goal <100 09/06/2014  . S/P TKR (total knee replacement) 08/12/2012  . Family history of heart disease in female family member before age 60 07/22/2012   Past Medical History:  Diagnosis Date  . Allergy    RHINITIS  .  Arthritis   . Depression   . Glaucoma   . Hyperlipidemia   . Hypertension   . Osteopenia   . TMJ syndrome     Family History  Problem Relation Age of Onset  . Heart disease Mother        valve replacement  . Cancer Mother        breast, throat  . Heart disease Father        died of brain embolism after hip surgery  . Arthritis Father   . Arthritis Brother   . Cancer Maternal Aunt        various cancers  . Diabetes Neg Hx   . Hypertension Neg Hx   . Stroke Neg Hx     Past Surgical History:  Procedure Laterality Date  . BIOPSY BREAST    . BLEPHAROPLASTY    . KNEE ARTHROSCOPY     right knee, 1990 and 2012, Dr. Durward Fortes  . TEMPOROMANDIBULAR JOINT SURGERY    . TONSILLECTOMY    . TOTAL  KNEE ARTHROPLASTY  08/10/2012   Procedure: TOTAL KNEE ARTHROPLASTY;  Surgeon: Garald Balding, MD;  Location: [facility];  Service: Orthopedics;  Laterality: Right;  Right Total Knee Arthroplasty  . TOTAL KNEE ARTHROPLASTY Left 06/14/2019   Procedure: LEFT TOTAL KNEE ARTHROPLASTY;  Surgeon: Garald Balding, MD;  Location: [facility];  Service: Orthopedics;  Laterality: Left;   Social History   Occupational History  . Occupation: attorney    Employer: [employer]  Tobacco Use  . Smoking status: Former Smoker    Quit date: 02/10/1980    Years since quitting: 39.4  . Smokeless tobacco: Never Used  Substance and Sexual Activity  . Alcohol use: Yes    Alcohol/week: 1.0 standard drinks    Types: 1 Glasses of wine per week    Comment: daily  . Drug use: No  . Sexual activity: Yes

## 2019-06-29 DIAGNOSIS — H409 Unspecified glaucoma: Secondary | ICD-10-CM | POA: Diagnosis not present

## 2019-06-29 DIAGNOSIS — I10 Essential (primary) hypertension: Secondary | ICD-10-CM | POA: Diagnosis not present

## 2019-06-29 DIAGNOSIS — E785 Hyperlipidemia, unspecified: Secondary | ICD-10-CM | POA: Diagnosis not present

## 2019-06-29 DIAGNOSIS — F329 Major depressive disorder, single episode, unspecified: Secondary | ICD-10-CM | POA: Diagnosis not present

## 2019-06-29 DIAGNOSIS — M8580 Other specified disorders of bone density and structure, unspecified site: Secondary | ICD-10-CM | POA: Diagnosis not present

## 2019-06-29 DIAGNOSIS — Z471 Aftercare following joint replacement surgery: Secondary | ICD-10-CM | POA: Diagnosis not present

## 2019-07-01 DIAGNOSIS — I10 Essential (primary) hypertension: Secondary | ICD-10-CM | POA: Diagnosis not present

## 2019-07-01 DIAGNOSIS — F329 Major depressive disorder, single episode, unspecified: Secondary | ICD-10-CM | POA: Diagnosis not present

## 2019-07-01 DIAGNOSIS — H409 Unspecified glaucoma: Secondary | ICD-10-CM | POA: Diagnosis not present

## 2019-07-01 DIAGNOSIS — Z471 Aftercare following joint replacement surgery: Secondary | ICD-10-CM | POA: Diagnosis not present

## 2019-07-01 DIAGNOSIS — E785 Hyperlipidemia, unspecified: Secondary | ICD-10-CM | POA: Diagnosis not present

## 2019-07-01 DIAGNOSIS — M8580 Other specified disorders of bone density and structure, unspecified site: Secondary | ICD-10-CM | POA: Diagnosis not present

## 2019-07-04 DIAGNOSIS — M25562 Pain in left knee: Secondary | ICD-10-CM | POA: Diagnosis not present

## 2019-07-04 DIAGNOSIS — M6281 Muscle weakness (generalized): Secondary | ICD-10-CM | POA: Diagnosis not present

## 2019-07-04 DIAGNOSIS — R262 Difficulty in walking, not elsewhere classified: Secondary | ICD-10-CM | POA: Diagnosis not present

## 2019-07-04 DIAGNOSIS — Z96652 Presence of left artificial knee joint: Secondary | ICD-10-CM | POA: Diagnosis not present

## 2019-07-05 ENCOUNTER — Other Ambulatory Visit: Payer: Self-pay

## 2019-07-05 ENCOUNTER — Telehealth: Payer: Self-pay | Admitting: *Deleted

## 2019-07-05 ENCOUNTER — Encounter: Payer: Self-pay | Admitting: Orthopaedic Surgery

## 2019-07-05 ENCOUNTER — Ambulatory Visit (INDEPENDENT_AMBULATORY_CARE_PROVIDER_SITE_OTHER): Payer: Medicare Other | Admitting: Orthopaedic Surgery

## 2019-07-05 VITALS — BP 122/66 | HR 104 | Ht 63.0 in | Wt 148.0 lb

## 2019-07-05 DIAGNOSIS — Z96652 Presence of left artificial knee joint: Secondary | ICD-10-CM

## 2019-07-05 MED ORDER — TRAMADOL HCL 50 MG PO TABS: 50.0000 mg | ORAL_TABLET | Freq: Four times a day (QID) | ORAL | 1 refills | Status: DC | PRN

## 2019-07-05 NOTE — Care Plan (Signed)
RNCM met with patient and her husband for a 1 week f/u with Dr. Durward Fortes. She is now 3 weeks s/p Left total knee replacement. She is noted to be improved since last week and is able to engage her quadriceps in the left leg and perform a straight leg raise today independently. Incision is healing with less bruising and swelling. She continues with the Bandana and has started with OPPT yesterday. She is scheduled for additional visits later in the week. Dr. Durward Fortes recommends Tramadol for pain if she would like to try this in place of Oxycodone. This was called into her pharmacy. F/U scheduled with Dr. Durward Fortes in 3 weeks on 07/26/19 at 3:00 pm. Overall improved.

## 2019-07-05 NOTE — Progress Notes (Signed)
Office Visit Note   Patient: Jodi Duffy           Date of Birth: 08/29/1951           MRN: [id number] Visit Date: 07/05/2019              Requested by: Denita Lung, MD [address] [place],  [place] [address] PCP: Denita Lung, MD   Assessment & Plan: Visit Diagnoses:  1. Total knee replacement status, left     Plan: 3 weeks status post left total knee replacement.  Still having some pain.  No evidence of infection.  Quadriceps muscle now intact but weak able to straight leg raise with very minimal extensor lag.  Will continue with weightbearing as tolerated with a walker or cane return in 3 weeks.  Try tramadol for pain  Follow-Up Instructions: Return in about 3 weeks (around 07/26/2019).   Orders:  No orders of the defined types were placed in this encounter.  No orders of the defined types were placed in this encounter.     Procedures: No procedures performed   Clinical Data: No additional findings.   Subjective: Chief Complaint  Patient presents with  . Left Knee - Routine Post Op    Left TKA DOS 06/14/2019  Patient presents today for a one week follow up on her left knee. She is now three weeks out from a left total knee arthroplasty. Her surgery was on 06/14/2019. Patient states that she is still painful, but getting around a little better. She started outpatient therapy yesterday.  No related fever or chills.  HPI  Review of Systems   Objective: Vital Signs: BP 122/66   Pulse (!) 104   Ht 5\' 3"  (1.6 m)   Wt 148 lb (67.1 kg)   BMI 26.22 kg/m   Physical Exam  Ortho Exam left knee incision healing without problem.  There has been some areas of ecchymosis that are resolving.  Some areas of pinkish discoloration but no drainage or evidence of infection.  No distal edema.  No calf pain.  Quadriceps now functioning able to straight leg raise with a small extensor lag.  Quadriceps are weak at screen to be an issue over time  Specialty  Comments:  No specialty comments available.  Imaging: No results found.   PMFS History: Patient Active Problem List   Diagnosis Date Noted  . Total knee replacement status, left 06/14/2019  . Bilateral primary osteoarthritis of knee 01/18/2019  . Acute pain of left knee 01/18/2019  . Pain in right elbow 10/18/2018  . Cotton wool spots 09/29/2018  . Epidermoid cyst of skin 03/17/2018  . Menopause present 03/17/2018  . Essential hypertension 02/10/2018  . Neck pain 05/29/2017  . Other seasonal allergic rhinitis 11/15/2015  . Family history of breast cancer in mother 09/06/2014  . Dysthymia 09/06/2014  . Glaucoma 09/06/2014  . Hyperlipidemia LDL goal <100 09/06/2014  . S/P TKR (total knee replacement) 08/12/2012  . Family history of heart disease in female family member before age 20 07/22/2012   Past Medical History:  Diagnosis Date  . Allergy    RHINITIS  . Arthritis   . Depression   . Glaucoma   . Hyperlipidemia   . Hypertension   . Osteopenia   . TMJ syndrome     Family History  Problem Relation Age of Onset  . Heart disease Mother        valve replacement  . Cancer Mother  breast, throat  . Heart disease Father        died of brain embolism after hip surgery  . Arthritis Father   . Arthritis Brother   . Cancer Maternal Aunt        various cancers  . Diabetes Neg Hx   . Hypertension Neg Hx   . Stroke Neg Hx     Past Surgical History:  Procedure Laterality Date  . BIOPSY BREAST    . BLEPHAROPLASTY    . KNEE ARTHROSCOPY     right knee, 1990 and 2012, Dr. Durward Fortes  . TEMPOROMANDIBULAR JOINT SURGERY    . TONSILLECTOMY    . TOTAL KNEE ARTHROPLASTY  08/10/2012   Procedure: TOTAL KNEE ARTHROPLASTY;  Surgeon: Garald Balding, MD;  Location: [facility];  Service: Orthopedics;  Laterality: Right;  Right Total Knee Arthroplasty  . TOTAL KNEE ARTHROPLASTY Left 06/14/2019   Procedure: LEFT TOTAL KNEE ARTHROPLASTY;  Surgeon: Garald Balding, MD;  Location: [facility];  Service: Orthopedics;  Laterality: Left;   Social History   Occupational History  . Occupation: attorney    Employer: [employer]  Tobacco Use  . Smoking status: Former Smoker    Quit date: 02/10/1980    Years since quitting: 39.4  . Smokeless tobacco: Never Used  Substance and Sexual Activity  . Alcohol use: Yes    Alcohol/week: 1.0 standard drinks    Types: 1 Glasses of wine per week    Comment: daily  . Drug use: No  . Sexual activity: Yes

## 2019-07-05 NOTE — Telephone Encounter (Signed)
Ortho bundle in office visit attended.

## 2019-07-06 ENCOUNTER — Telehealth: Payer: Self-pay | Admitting: Orthopaedic Surgery

## 2019-07-06 DIAGNOSIS — M6281 Muscle weakness (generalized): Secondary | ICD-10-CM | POA: Diagnosis not present

## 2019-07-06 DIAGNOSIS — M25562 Pain in left knee: Secondary | ICD-10-CM | POA: Diagnosis not present

## 2019-07-06 DIAGNOSIS — Z96652 Presence of left artificial knee joint: Secondary | ICD-10-CM | POA: Diagnosis not present

## 2019-07-06 DIAGNOSIS — R262 Difficulty in walking, not elsewhere classified: Secondary | ICD-10-CM | POA: Diagnosis not present

## 2019-07-12 DIAGNOSIS — M6281 Muscle weakness (generalized): Secondary | ICD-10-CM | POA: Diagnosis not present

## 2019-07-12 DIAGNOSIS — R262 Difficulty in walking, not elsewhere classified: Secondary | ICD-10-CM | POA: Diagnosis not present

## 2019-07-12 DIAGNOSIS — M25562 Pain in left knee: Secondary | ICD-10-CM | POA: Diagnosis not present

## 2019-07-12 DIAGNOSIS — Z96652 Presence of left artificial knee joint: Secondary | ICD-10-CM | POA: Diagnosis not present

## 2019-07-12 NOTE — Telephone Encounter (Signed)
error 

## 2019-07-14 ENCOUNTER — Telehealth: Payer: Self-pay | Admitting: *Deleted

## 2019-07-14 ENCOUNTER — Telehealth: Payer: Self-pay | Admitting: Orthopaedic Surgery

## 2019-07-14 DIAGNOSIS — M6281 Muscle weakness (generalized): Secondary | ICD-10-CM | POA: Diagnosis not present

## 2019-07-14 DIAGNOSIS — M25562 Pain in left knee: Secondary | ICD-10-CM | POA: Diagnosis not present

## 2019-07-14 DIAGNOSIS — Z96652 Presence of left artificial knee joint: Secondary | ICD-10-CM | POA: Diagnosis not present

## 2019-07-14 DIAGNOSIS — R262 Difficulty in walking, not elsewhere classified: Secondary | ICD-10-CM | POA: Diagnosis not present

## 2019-07-14 NOTE — Telephone Encounter (Signed)
Insurance cards faxed to [facility] at [place] [facility] [phone]

## 2019-07-14 NOTE — Care Plan (Signed)
RNCM call to patient to check her status at 30 days post-op. She verbalized she was still in a great deal of pain and having difficulty sleeping. Taking Tramadol currently and only able to sleep in 4 hour increments, then waking and taking more pain medication. Swelling has decreased slightly. Feels she is making improvements with therapy and able to flex to approximately 99 degrees and lift her leg better. She verbalized she was not really taking the muscle relaxer and RNCM suggested she try this for help with relaxation and possible improved sleep at night. She is also going to reach out to the office to have Dr. Durward Fortes call her to discuss pain. Reviewed 30 day patient satisfaction survey provided by [facility] in place of Epic patient satisfaction survey.  1. Prior to surgery I was provided sufficient education regarding my surgery and the bundle program- Patient answer- Strongly agree 2. I was satisfied with the care I received at the facility where my surgery was performed- Patient answer- Strongly agree 3. Following surgery, I received sufficient postoperative care instructions- Patient answer- Strongly agree 4. I would recommend my surgeon and this bundle program to others- Patient answer- Strongly agree

## 2019-07-14 NOTE — Telephone Encounter (Signed)
30 day Ortho bundle call completed.

## 2019-07-14 NOTE — Telephone Encounter (Signed)
Patient left a voicemail stating Dr. Durward Fortes gave her his cell phone number which she lost.  Patient is requesting Dr. Durward Fortes call her back at his convenience.  [phone]

## 2019-07-14 NOTE — Telephone Encounter (Signed)
I notified Dr.Whitfield.

## 2019-07-18 ENCOUNTER — Other Ambulatory Visit: Payer: Self-pay | Admitting: Orthopaedic Surgery

## 2019-07-18 ENCOUNTER — Telehealth: Payer: Self-pay | Admitting: Orthopaedic Surgery

## 2019-07-18 NOTE — Telephone Encounter (Signed)
Tried to call patient. No answer. LMOM that Tramadol has been called into her [facility] on [address].

## 2019-07-18 NOTE — Telephone Encounter (Signed)
Ok to refill 

## 2019-07-19 ENCOUNTER — Encounter: Payer: Self-pay | Admitting: Family Medicine

## 2019-07-19 DIAGNOSIS — Z803 Family history of malignant neoplasm of breast: Secondary | ICD-10-CM | POA: Diagnosis not present

## 2019-07-19 DIAGNOSIS — E2839 Other primary ovarian failure: Secondary | ICD-10-CM | POA: Diagnosis not present

## 2019-07-19 DIAGNOSIS — R262 Difficulty in walking, not elsewhere classified: Secondary | ICD-10-CM | POA: Diagnosis not present

## 2019-07-19 DIAGNOSIS — Z96652 Presence of left artificial knee joint: Secondary | ICD-10-CM | POA: Diagnosis not present

## 2019-07-19 DIAGNOSIS — M6281 Muscle weakness (generalized): Secondary | ICD-10-CM | POA: Diagnosis not present

## 2019-07-19 DIAGNOSIS — Z780 Asymptomatic menopausal state: Secondary | ICD-10-CM | POA: Diagnosis not present

## 2019-07-19 DIAGNOSIS — Z1231 Encounter for screening mammogram for malignant neoplasm of breast: Secondary | ICD-10-CM | POA: Diagnosis not present

## 2019-07-19 DIAGNOSIS — M25562 Pain in left knee: Secondary | ICD-10-CM | POA: Diagnosis not present

## 2019-07-19 DIAGNOSIS — Z96653 Presence of artificial knee joint, bilateral: Secondary | ICD-10-CM | POA: Diagnosis not present

## 2019-07-19 LAB — HM DEXA SCAN: HM Dexa Scan: NORMAL

## 2019-07-19 LAB — HM MAMMOGRAPHY

## 2019-07-20 ENCOUNTER — Encounter: Payer: Self-pay | Admitting: Orthopaedic Surgery

## 2019-07-20 ENCOUNTER — Ambulatory Visit (INDEPENDENT_AMBULATORY_CARE_PROVIDER_SITE_OTHER): Payer: Medicare Other | Admitting: Orthopaedic Surgery

## 2019-07-20 ENCOUNTER — Other Ambulatory Visit: Payer: Self-pay

## 2019-07-20 ENCOUNTER — Inpatient Hospital Stay: Payer: Medicare Other | Admitting: Orthopaedic Surgery

## 2019-07-20 DIAGNOSIS — Z96652 Presence of left artificial knee joint: Secondary | ICD-10-CM

## 2019-07-20 MED ORDER — LIDOCAINE HCL 1 % IJ SOLN
2.0000 mL | INTRAMUSCULAR | Status: AC | PRN
  Administered 2019-07-20: 2 mL

## 2019-07-20 NOTE — Progress Notes (Signed)
Office Visit Note   Patient: Jodi Duffy           Date of Birth: 16-Jan-1951           MRN: [id number] Visit Date: 07/20/2019              Requested by: Denita Lung, MD [place],  [place] [address] PCP: Denita Lung, MD   Assessment & Plan: Visit Diagnoses:  1. Total knee replacement status, left     Plan: 5 weeks status post primary left total knee replacement still having some pain.  No fever or chills.  Exam is benign. full passive extension with about a 20 degree extensor lag and flexed over 105 degrees.  No instability.  Knee was just minimally warm compared to the left knee but what I would expect at just 5 weeks.  I did aspirate less than 10 cc of  thin clear bloody fluid which I will send to the lab.  Still working with exercises.  Uses only a cane.  I think she is doing fine without an obvious source of her pain.  Will reevaluate over the next several weeks and continue with the tramadol as needed  Follow-Up Instructions: Return as scheduled.   Orders:  Orders Placed This Encounter  Procedures   Anaerobic and Aerobic Culture   Gram stain   Synovial cell count + diff, w/ crystals   No orders of the defined types were placed in this encounter.     Procedures: Large Joint Inj: L knee on 07/20/2019 12:33 PM Indications: pain and diagnostic evaluation Details: 25 G 1.5 in needle, anteromedial approach  Arthrogram: No  Medications: 2 mL lidocaine 1 % Aspirate: blood-tinged; sent for lab analysis Procedure, treatment alternatives, risks and benefits explained, specific risks discussed. Consent was given by the patient. Patient was prepped and draped in the usual sterile fashion.       Clinical Data: No additional findings.   Subjective: No chief complaint on file. 5 weeks status post primary left total knee replacement.  She had a problem with the abductor canal block postoperatively as it lasted for over 2 weeks and she had  little if any quad firing.  Now she is using a cane and continues with her exercises.  Has not had any fever or chills.  She just "pain".  Using tramadol for her pain. [place] did have a bone density study last week and we  have the results -normal without evidence of osteopenia or osteoporosis. She is given a copy of the report. HPI  Review of Systems   Objective: Vital Signs: There were no vitals taken for this visit.  Physical Exam  Ortho Exam left knee incision appears to be healing without a problem.  There was an area of reddish blotching along the proximal lateral tibia that was not painful on occasion it might "itch".  It might be related to the ice.  No calf pain.  No distal edema.  Neurologically intact.  I could just about fully passively extend her knee and she had about a 20 degree extensor lag because of quad weakness.  She flexed over 105 degrees.  No instability with varus valgus stress or with an anterior drawer sign.  Had some areas of tenderness.  The knee was a little bit warmer compared to the to the right knee.  I did aspirate the knee of about 7 cc of blood-tinged thin clear fluid.  Will send to the lab  Specialty Comments:  No specialty comments available.  Imaging: No results found.   PMFS History: Patient Active Problem List   Diagnosis Date Noted   Total knee replacement status, left 06/14/2019   Bilateral primary osteoarthritis of knee 01/18/2019   Acute pain of left knee 01/18/2019   Pain in right elbow 10/18/2018   Cotton wool spots 09/29/2018   Epidermoid cyst of skin 03/17/2018   Menopause present 03/17/2018   Essential hypertension 02/10/2018   Neck pain 05/29/2017   Other seasonal allergic rhinitis 11/15/2015   Family history of breast cancer in mother 09/06/2014   Dysthymia 09/06/2014   Glaucoma 09/06/2014   Hyperlipidemia LDL goal <100 09/06/2014   S/P TKR (total knee replacement) 08/12/2012   Family history of heart disease in  female family member before age 60 07/22/2012   Past Medical History:  Diagnosis Date   Allergy    RHINITIS   Arthritis    Depression    Glaucoma    Hyperlipidemia    Hypertension    Osteopenia    TMJ syndrome     Family History  Problem Relation Age of Onset   Heart disease Mother        valve replacement   Cancer Mother        breast, throat   Heart disease Father        died of brain embolism after hip surgery   Arthritis Father    Arthritis Brother    Cancer Maternal Aunt        various cancers   Diabetes Neg Hx    Hypertension Neg Hx    Stroke Neg Hx     Past Surgical History:  Procedure Laterality Date   BIOPSY BREAST     BLEPHAROPLASTY     KNEE ARTHROSCOPY     right knee, 1990 and 2012, Dr. Ty Hilts JOINT SURGERY     TONSILLECTOMY     TOTAL KNEE ARTHROPLASTY  08/10/2012   Procedure: TOTAL KNEE ARTHROPLASTY;  Surgeon: Garald Balding, MD;  Location: [facility];  Service: Orthopedics;  Laterality: Right;  Right Total Knee Arthroplasty   TOTAL KNEE ARTHROPLASTY Left 06/14/2019   Procedure: LEFT TOTAL KNEE ARTHROPLASTY;  Surgeon: Garald Balding, MD;  Location: [facility];  Service: Orthopedics;  Laterality: Left;   Social History   Occupational History   Occupation: Medical laboratory scientific officer: [employer]  Tobacco Use   Smoking status: Former Smoker    Quit date: 02/10/1980    Years since quitting: 39.4   Smokeless tobacco: Never Used  Substance and Sexual Activity   Alcohol use: Yes    Alcohol/week: 1.0 standard drinks    Types: 1 Glasses of wine per week    Comment: daily   Drug use: No   Sexual activity: Yes     Garald Balding, MD   Note - This record has been created using Editor, commissioning.  Chart creation errors have been sought, but may not always  have been located. Such creation errors do not reflect on  the standard of medical care.

## 2019-07-21 DIAGNOSIS — Z96652 Presence of left artificial knee joint: Secondary | ICD-10-CM | POA: Diagnosis not present

## 2019-07-21 DIAGNOSIS — M25562 Pain in left knee: Secondary | ICD-10-CM | POA: Diagnosis not present

## 2019-07-21 DIAGNOSIS — R262 Difficulty in walking, not elsewhere classified: Secondary | ICD-10-CM | POA: Diagnosis not present

## 2019-07-21 DIAGNOSIS — M6281 Muscle weakness (generalized): Secondary | ICD-10-CM | POA: Diagnosis not present

## 2019-07-22 ENCOUNTER — Other Ambulatory Visit: Payer: Self-pay | Admitting: *Deleted

## 2019-07-22 ENCOUNTER — Telehealth: Payer: Self-pay | Admitting: Orthopaedic Surgery

## 2019-07-22 MED ORDER — TRAMADOL HCL 50 MG PO TABS: ORAL_TABLET | ORAL | 0 refills | Status: DC

## 2019-07-22 NOTE — Telephone Encounter (Signed)
Patient called requesting prescription refill of Tramadol to be sent to [facility] at [place].  Patient states "she has been taking the prescription pretty regularly and with a long weekend is worried she will run out of medication."  Patient is requesting a return call to let her know if Dr. Durward Fortes will be able to refill the prescription.

## 2019-07-22 NOTE — Telephone Encounter (Signed)
Called in Tramadol per Dr. Durward Fortes, I called patient.

## 2019-07-26 ENCOUNTER — Ambulatory Visit: Payer: Medicare Other | Admitting: Orthopaedic Surgery

## 2019-07-26 DIAGNOSIS — R262 Difficulty in walking, not elsewhere classified: Secondary | ICD-10-CM | POA: Diagnosis not present

## 2019-07-26 DIAGNOSIS — M6281 Muscle weakness (generalized): Secondary | ICD-10-CM | POA: Diagnosis not present

## 2019-07-26 DIAGNOSIS — Z96652 Presence of left artificial knee joint: Secondary | ICD-10-CM | POA: Diagnosis not present

## 2019-07-26 DIAGNOSIS — M25562 Pain in left knee: Secondary | ICD-10-CM | POA: Diagnosis not present

## 2019-07-26 LAB — ANAEROBIC AND AEROBIC CULTURE
AER RESULT:: NO GROWTH
MICRO NUMBER:: 840263
MICRO NUMBER:: 840568
SPECIMEN QUALITY:: ADEQUATE
SPECIMEN QUALITY:: ADEQUATE

## 2019-07-26 LAB — SYNOVIAL CELL COUNT + DIFF, W/ CRYSTALS
Basophils, %: 0 %
Eosinophils-Synovial: 1 % (ref 0–2)
Lymphocytes-Synovial Fld: 14 % (ref 0–74)
Monocyte/Macrophage: 8 % (ref 0–69)
Neutrophil, Synovial: 77 % — ABNORMAL HIGH (ref 0–24)
Synoviocytes, %: 0 % (ref 0–15)
WBC, Synovial: 435 cells/uL — ABNORMAL HIGH (ref <150)

## 2019-07-28 ENCOUNTER — Telehealth: Payer: Self-pay | Admitting: Family Medicine

## 2019-07-28 DIAGNOSIS — M6281 Muscle weakness (generalized): Secondary | ICD-10-CM | POA: Diagnosis not present

## 2019-07-28 DIAGNOSIS — Z96652 Presence of left artificial knee joint: Secondary | ICD-10-CM | POA: Diagnosis not present

## 2019-07-28 DIAGNOSIS — M25562 Pain in left knee: Secondary | ICD-10-CM | POA: Diagnosis not present

## 2019-07-28 DIAGNOSIS — R262 Difficulty in walking, not elsewhere classified: Secondary | ICD-10-CM | POA: Diagnosis not present

## 2019-07-28 NOTE — Telephone Encounter (Signed)
Pt called and wanted to let you know that she had a whole lot of blood work done and she just don't want to have unnecessary blood work done at her wellness appt on 09/15

## 2019-08-02 ENCOUNTER — Other Ambulatory Visit: Payer: Self-pay

## 2019-08-02 ENCOUNTER — Encounter: Payer: Self-pay | Admitting: Family Medicine

## 2019-08-02 ENCOUNTER — Ambulatory Visit (INDEPENDENT_AMBULATORY_CARE_PROVIDER_SITE_OTHER): Payer: Medicare Other | Admitting: Family Medicine

## 2019-08-02 VITALS — BP 128/82 | HR 90 | Temp 97.3°F | Ht 62.5 in | Wt 138.4 lb

## 2019-08-02 DIAGNOSIS — M25562 Pain in left knee: Secondary | ICD-10-CM | POA: Diagnosis not present

## 2019-08-02 DIAGNOSIS — Z96651 Presence of right artificial knee joint: Secondary | ICD-10-CM

## 2019-08-02 DIAGNOSIS — J302 Other seasonal allergic rhinitis: Secondary | ICD-10-CM | POA: Diagnosis not present

## 2019-08-02 DIAGNOSIS — Z96652 Presence of left artificial knee joint: Secondary | ICD-10-CM | POA: Diagnosis not present

## 2019-08-02 DIAGNOSIS — F341 Dysthymic disorder: Secondary | ICD-10-CM | POA: Diagnosis not present

## 2019-08-02 DIAGNOSIS — Z23 Encounter for immunization: Secondary | ICD-10-CM

## 2019-08-02 DIAGNOSIS — R7309 Other abnormal glucose: Secondary | ICD-10-CM | POA: Diagnosis not present

## 2019-08-02 DIAGNOSIS — I10 Essential (primary) hypertension: Secondary | ICD-10-CM | POA: Diagnosis not present

## 2019-08-02 DIAGNOSIS — Z803 Family history of malignant neoplasm of breast: Secondary | ICD-10-CM

## 2019-08-02 DIAGNOSIS — Z8249 Family history of ischemic heart disease and other diseases of the circulatory system: Secondary | ICD-10-CM | POA: Diagnosis not present

## 2019-08-02 DIAGNOSIS — M6281 Muscle weakness (generalized): Secondary | ICD-10-CM | POA: Diagnosis not present

## 2019-08-02 DIAGNOSIS — H409 Unspecified glaucoma: Secondary | ICD-10-CM

## 2019-08-02 DIAGNOSIS — E785 Hyperlipidemia, unspecified: Secondary | ICD-10-CM

## 2019-08-02 DIAGNOSIS — R262 Difficulty in walking, not elsewhere classified: Secondary | ICD-10-CM | POA: Diagnosis not present

## 2019-08-02 LAB — CBC WITH DIFFERENTIAL/PLATELET
Basophils Absolute: 0 10*3/uL (ref 0.0–0.2)
Basos: 1 %
EOS (ABSOLUTE): 0.1 10*3/uL (ref 0.0–0.4)
Eos: 1 %
Hematocrit: 37.8 % (ref 34.0–46.6)
Hemoglobin: 12.2 g/dL (ref 11.1–15.9)
Immature Grans (Abs): 0 10*3/uL (ref 0.0–0.1)
Immature Granulocytes: 0 %
Lymphocytes Absolute: 1.3 10*3/uL (ref 0.7–3.1)
Lymphs: 26 %
MCH: 28.5 pg (ref 26.6–33.0)
MCHC: 32.3 g/dL (ref 31.5–35.7)
MCV: 88 fL (ref 79–97)
Monocytes Absolute: 0.4 10*3/uL (ref 0.1–0.9)
Monocytes: 7 %
Neutrophils Absolute: 3.4 10*3/uL (ref 1.4–7.0)
Neutrophils: 65 %
Platelets: 404 10*3/uL (ref 150–450)
RBC: 4.28 x10E6/uL (ref 3.77–5.28)
RDW: 14 % (ref 11.7–15.4)
WBC: 5.2 10*3/uL (ref 3.4–10.8)

## 2019-08-02 LAB — COMPREHENSIVE METABOLIC PANEL
ALT: 26 IU/L (ref 0–32)
AST: 18 IU/L (ref 0–40)
Albumin/Globulin Ratio: 1.7 (ref 1.2–2.2)
Albumin: 4.4 g/dL (ref 3.8–4.8)
Alkaline Phosphatase: 80 IU/L (ref 39–117)
BUN/Creatinine Ratio: 23 (ref 12–28)
BUN: 13 mg/dL (ref 8–27)
Bilirubin Total: 0.2 mg/dL (ref 0.0–1.2)
CO2: 26 mmol/L (ref 20–29)
Calcium: 10.5 mg/dL — ABNORMAL HIGH (ref 8.7–10.3)
Chloride: 102 mmol/L (ref 96–106)
Creatinine, Ser: 0.57 mg/dL (ref 0.57–1.00)
GFR calc Af Amer: 109 mL/min/{1.73_m2} (ref >59)
GFR calc non Af Amer: 94 mL/min/{1.73_m2} (ref >59)
Globulin, Total: 2.6 g/dL (ref 1.5–4.5)
Glucose: 114 mg/dL — ABNORMAL HIGH (ref 65–99)
Potassium: 4.7 mmol/L (ref 3.5–5.2)
Sodium: 141 mmol/L (ref 134–144)
Total Protein: 7 g/dL (ref 6.0–8.5)

## 2019-08-02 LAB — LIPID PANEL
Chol/HDL Ratio: 3.1 ratio (ref 0.0–4.4)
Cholesterol, Total: 148 mg/dL (ref 100–199)
HDL: 47 mg/dL (ref >39)
LDL Chol Calc (NIH): 74 mg/dL (ref 0–99)
Triglycerides: 155 mg/dL — ABNORMAL HIGH (ref 0–149)
VLDL Cholesterol Cal: 27 mg/dL (ref 5–40)

## 2019-08-02 MED ORDER — OLMESARTAN MEDOXOMIL-HCTZ 40-12.5 MG PO TABS: 1.0000 | ORAL_TABLET | Freq: Every day | ORAL | 3 refills | Status: DC

## 2019-08-02 MED ORDER — ATORVASTATIN CALCIUM 10 MG PO TABS: 10.0000 mg | ORAL_TABLET | Freq: Every day | ORAL | 3 refills | Status: DC

## 2019-08-02 MED ORDER — VENLAFAXINE HCL ER 150 MG PO CP24: ORAL_CAPSULE | ORAL | 3 refills | Status: DC

## 2019-08-02 NOTE — Progress Notes (Signed)
Jodi Duffy is a 68 y.o. female who presents for annual wellness visit and follow-up on chronic medical conditions.  She recently had TKR and is still recovering from that.  She is still having some pain with this requiring use of tramadol which does slow her down.  He continues on her olmesartan/HCTZ and is having no difficulty with that.  She is taking Effexor which she is very happy with. She follows up regularly with her ophthalmologist.  Her allergies seem to be under good control.  There is a family history of heart disease as well as breast cancer.  She continues on atorvastatin.  She does get regular mammograms. Immunizations and Health Maintenance Immunization History  Administered Date(s) Administered  . DTaP 08/02/1998  . Influenza Split 08/11/2012  . Influenza Whole 09/04/2008, 08/27/2010  . Influenza, High Dose Seasonal PF 09/06/2014, 08/07/2018  . Influenza,inj,Quad PF,6+ Mos 08/08/2013  . Influenza-Unspecified 08/17/2014, 08/18/2015, 08/17/2016, 08/17/2017  . Pneumococcal Conjugate-13 11/15/2015  . Pneumococcal Polysaccharide-23 08/02/1998, 11/15/2012  . Tdap 10/04/2008, 06/21/2018  . Zoster 08/27/2010  . Zoster Recombinat (Shingrix) 02/09/2017, 04/30/2017   Health Maintenance Due  Topic Date Due  . MAMMOGRAM  06/09/2019  . INFLUENZA VACCINE  06/18/2019    Last Pap smear: aged out  Last mammogram:07-19-19 Last colonoscopy: 03/24/18 cologuard Last DEXA: 07-19-19 Dentist: Q six month Ophtho: six months ago Exercise: pt with new knee  Other doctors caring for patient include: Dr. Carter Kitten ortho, [facility]  Advanced directives: Yes.  Patient prefers to not have them on file. Does Patient Have a Medical Advance Directive?: Yes Type of Advance Directive: Healthcare Power of Attorney, Living will Does patient want to make changes to medical advance directive?: No - Patient declined  Depression screen:  See questionnaire below.  Depression screen [facility] 2/9 08/02/2019  02/10/2018 02/09/2017 11/15/2015 11/15/2015  Decreased Interest 0 0 0 0 0  Down, Depressed, Hopeless - 0 0 0 0  PHQ - 2 Score 0 0 0 0 0    Fall Risk Screen: see questionnaire below. Fall Risk  08/02/2019 05/16/2019 05/13/2018 02/10/2018 02/10/2018  Falls in the past year? 0 0 No - Yes  Number falls in past yr: - 0 - 2 or more 2 or more  Injury with Fall? - 0 - No No  Risk for fall due to : - - - Impaired balance/gait -    ADL screen:  See questionnaire below Functional Status Survey: Is the patient deaf or have difficulty hearing?: Yes(hearing aide) Does the patient have difficulty seeing, even when wearing glasses/contacts?: No Does the patient have difficulty concentrating, remembering, or making decisions?: No Does the patient have difficulty walking or climbing stairs?: Yes(due to new knee) Does the patient have difficulty dressing or bathing?: No Does the patient have difficulty doing errands alone such as visiting a doctor's office or shopping?: No   Review of Systems Constitutional: -, -unexpected weight change, -anorexia, -fatigue Allergy: -sneezing, -itching, -congestion Dermatology: denies changing moles, rash, lumps ENT: -runny nose, -ear pain, -sore throat,  Cardiology:  -chest pain, -palpitations, -orthopnea, Respiratory: -cough, -shortness of breath, -dyspnea on exertion, -wheezing,  Gastroenterology: -abdominal pain, -nausea, -vomiting, -diarrhea, -constipation, -dysphagia Hematology: -bleeding or bruising problems Musculoskeletal: -arthralgias, -myalgias, -joint swelling, -back pain, - Ophthalmology: -vision changes,  Urology: -dysuria, -difficulty urinating,  -urinary frequency, -urgency, incontinence Neurology: -, -numbness, , -memory loss, -falls, -dizziness    PHYSICAL EXAM:  BP 128/82 (BP Location: Right Arm, Patient Position: Sitting)   Pulse 90   Temp Marland Kitchen)  97.3 F (36.3 C)   Ht 5' 2.5" (1.588 m)   Wt 138 lb 6.4 oz (62.8 kg)   SpO2 97%   BMI 24.91 kg/m    General Appearance: Alert, cooperative, no distress, appears stated age Head: Normocephalic, without obvious abnormality, atraumatic Eyes: PERRL, conjunctiva/corneas clear, EOM's intact, fundi benign Ears: Normal TM's and external ear canals Nose: Nares normal, mucosa normal, no drainage or sinus tenderness Throat: Lips, mucosa, and tongue normal; teeth and gums normal Neck: Supple, no lymphadenopathy;  thyroid:  no enlargement/tenderness/nodules; no carotid bruit or JVD Lungs: Clear to auscultation bilaterally without wheezes, rales or ronchi; respirations unlabored Heart: Regular rate and rhythm, S1 and S2 normal, no murmur, rubor gallop Abdomen: Soft, non-tender, nondistended, normoactive bowel sounds,  no masses, no hepatosplenomegaly Extremities: No clubbing, cyanosis or edema.  Surgical scars noted on both knees. Pulses: 2+ and symmetric all extremities Skin:  Skin color, texture, turgor normal, no rashes or lesions Lymph nodes: Cervical, supraclavicular, and axillary nodes normal Neurologic:  CNII-XII intact, normal strength, sensation and gait; reflexes 2+ and symmetric throughout Psych: Normal mood, affect, hygiene and grooming.  ASSESSMENT/PLAN: Status post total right knee replacement  Need for influenza vaccination - Plan: Flu Vaccine QUAD High Dose(Fluad)  Family history of breast cancer in mother  Family history of heart disease in female family member before age 59 - Plan: atorvastatin (LIPITOR) 10 MG tablet  Glaucoma, unspecified glaucoma type, unspecified laterality  Dysthymia - Plan: venlafaxine XR (EFFEXOR-XR) 150 MG 24 hr capsule  Hyperlipidemia LDL goal <100 - Plan: Lipid panel, atorvastatin (LIPITOR) 10 MG tablet  Essential hypertension - Plan: CBC with Differential/Platelet, Comprehensive metabolic panel, olmesartan-hydrochlorothiazide (BENICAR HCT) 40-12.5 MG tablet  Total knee replacement status, left  Other seasonal allergic rhinitis Discussed care of  her knee with her and showed her how to do some scar revision.  She will otherwise continue on her present medications.  Discussed the Effexor and again at this point no need to stop it. Recommend she try Tylenol for pain relief and then switch to 2 Aleve twice per day to try to avoid using the tramadol as it does make her drowsy.  Discussed  yearly mammograms; at least 30 minutes of aerobic activity at least 5 days/week and weight-bearing exercise 2x/week; and alcohol recommendations (less than or equal to 1 drink/day) reviewed;   Immunization recommendations discussed.  Colonoscopy recommendations reviewed   Medicare Attestation I have personally reviewed: The patient's medical and social history Their use of alcohol, tobacco or illicit drugs Their current medications and supplements The patient's functional ability including ADLs,fall risks, home safety risks, cognitive, and hearing and visual impairment Diet and physical activities Evidence for depression or mood disorders  The patient's weight, height, and BMI have been recorded in the chart.  I have made referrals, counseling, and provided education to the patient based on review of the above and I have provided the patient with a written personalized care plan for preventive services.     Jill Alexanders, MD   08/02/2019

## 2019-08-02 NOTE — Patient Instructions (Signed)
  Jodi Duffy , Thank you for taking time to come for your Medicare Wellness Visit. I appreciate your ongoing commitment to your health goals. Please review the following plan we discussed and let me know if I can assist you in the future.   These are the goals we discussed: First try Tylenol for pain relief and if that is not enough then try 2 Aleve twice per day and then try the tramadol This is a list of the screening recommended for you and due dates:  Health Maintenance  Topic Date Due  . Mammogram  06/09/2019  . Flu Shot  06/18/2019  . Cologuard (Stool DNA test)  03/24/2021  . Tetanus Vaccine  06/21/2028  . DEXA scan (bone density measurement)  Completed  .  Hepatitis C: One time screening is recommended by Center for Disease Control  (CDC) for  adults born from [birth year] through 1967.   Completed  . Pneumonia vaccines  Discontinued

## 2019-08-04 DIAGNOSIS — Z96652 Presence of left artificial knee joint: Secondary | ICD-10-CM | POA: Diagnosis not present

## 2019-08-04 DIAGNOSIS — R262 Difficulty in walking, not elsewhere classified: Secondary | ICD-10-CM | POA: Diagnosis not present

## 2019-08-04 DIAGNOSIS — M6281 Muscle weakness (generalized): Secondary | ICD-10-CM | POA: Diagnosis not present

## 2019-08-04 DIAGNOSIS — M25562 Pain in left knee: Secondary | ICD-10-CM | POA: Diagnosis not present

## 2019-08-05 ENCOUNTER — Telehealth: Payer: Self-pay | Admitting: Family Medicine

## 2019-08-05 LAB — HGB A1C W/O EAG: Hgb A1c MFr Bld: 5.5 % (ref 4.8–5.6)

## 2019-08-05 LAB — SPECIMEN STATUS REPORT

## 2019-08-05 NOTE — Telephone Encounter (Signed)
eceived requested records from [place]

## 2019-08-09 ENCOUNTER — Telehealth: Payer: Self-pay

## 2019-08-09 DIAGNOSIS — Z96652 Presence of left artificial knee joint: Secondary | ICD-10-CM | POA: Diagnosis not present

## 2019-08-09 DIAGNOSIS — M25562 Pain in left knee: Secondary | ICD-10-CM | POA: Diagnosis not present

## 2019-08-09 DIAGNOSIS — M6281 Muscle weakness (generalized): Secondary | ICD-10-CM | POA: Diagnosis not present

## 2019-08-09 DIAGNOSIS — R262 Difficulty in walking, not elsewhere classified: Secondary | ICD-10-CM | POA: Diagnosis not present

## 2019-08-09 NOTE — Telephone Encounter (Signed)
Called pt to advise dexa scan results show 0.7 which means pt will pt is still in normal range but will need a multi vitamin with calcium and vit d. Per [employer]. [place]

## 2019-08-11 DIAGNOSIS — M25562 Pain in left knee: Secondary | ICD-10-CM | POA: Diagnosis not present

## 2019-08-11 DIAGNOSIS — M6281 Muscle weakness (generalized): Secondary | ICD-10-CM | POA: Diagnosis not present

## 2019-08-11 DIAGNOSIS — R262 Difficulty in walking, not elsewhere classified: Secondary | ICD-10-CM | POA: Diagnosis not present

## 2019-08-11 DIAGNOSIS — Z96652 Presence of left artificial knee joint: Secondary | ICD-10-CM | POA: Diagnosis not present

## 2019-08-16 ENCOUNTER — Ambulatory Visit (INDEPENDENT_AMBULATORY_CARE_PROVIDER_SITE_OTHER): Payer: Medicare Other | Admitting: Family Medicine

## 2019-08-16 ENCOUNTER — Encounter: Payer: Self-pay | Admitting: Family Medicine

## 2019-08-16 ENCOUNTER — Other Ambulatory Visit: Payer: Self-pay

## 2019-08-16 VITALS — BP 140/84 | HR 80 | Temp 97.5°F | Ht 63.0 in | Wt 138.4 lb

## 2019-08-16 DIAGNOSIS — R8290 Unspecified abnormal findings in urine: Secondary | ICD-10-CM

## 2019-08-16 DIAGNOSIS — M6281 Muscle weakness (generalized): Secondary | ICD-10-CM | POA: Diagnosis not present

## 2019-08-16 DIAGNOSIS — R350 Frequency of micturition: Secondary | ICD-10-CM | POA: Diagnosis not present

## 2019-08-16 DIAGNOSIS — Z96652 Presence of left artificial knee joint: Secondary | ICD-10-CM | POA: Diagnosis not present

## 2019-08-16 DIAGNOSIS — N3000 Acute cystitis without hematuria: Secondary | ICD-10-CM

## 2019-08-16 DIAGNOSIS — M25562 Pain in left knee: Secondary | ICD-10-CM | POA: Diagnosis not present

## 2019-08-16 DIAGNOSIS — R262 Difficulty in walking, not elsewhere classified: Secondary | ICD-10-CM | POA: Diagnosis not present

## 2019-08-16 LAB — POCT URINALYSIS DIP (PROADVANTAGE DEVICE)
Bilirubin, UA: NEGATIVE
Blood, UA: NEGATIVE
Glucose, UA: NEGATIVE mg/dL
Ketones, POC UA: NEGATIVE mg/dL
Nitrite, UA: NEGATIVE
Protein Ur, POC: NEGATIVE mg/dL
Specific Gravity, Urine: 1.015
Urobilinogen, Ur: NEGATIVE
pH, UA: 7.5 (ref 5.0–8.0)

## 2019-08-16 NOTE — Patient Instructions (Signed)
Please drink plenty of water--try and get much of it earlier in the day. Limit to just a beverage with dinner. Avoid alcohol, limit caffeine--okay to continue your morning cup of coffee, but have water with lunch.  We will be in contact with your results. If there is no infection, we may consider a trial of sample of Myrbetriq to see if this helps your frequency, and if it doesn't, seeing a urologist may be in store. I don't recommend trying the other overactive bladder treatments due to effect on glaucoma, unless cleared by your eye doctor. If there is an infection, we will send an antibiotic to your pharmacy and let you know.

## 2019-08-16 NOTE — Progress Notes (Signed)
Chief Complaint  Patient presents with  . Urinary Frequency    and odor when she goes. Urinating every two hours. Had knee surgery 9 weeks ago, she mentioned this.     "it smells horrible". She recalls having similar odor to her urine, two times, when she had infections. Odor in urine x 3 days.  Not fishy, "acidic, nasty smell" She has noted urinary frequency, every 2 hours for the last 9 weeks, ever since her L knee surgery. No dysuria, no hematuria, abdominal pain, flank pain. No vaginal discharge. The frequency is interfering with her sleep at night (unchanged x 9 weeks, not worse in the last 3 days since odor noted).  Caffeine:  1 cup of coffee and 1 cup of iced tea at lunch. She tapered off tramadol a wek and a half ago, taking Aleve for pain. She takes her BP med (with diuretic) in the morning, as always.  PMH, PSH, SH reviewed  Outpatient Encounter Medications as of 08/16/2019  Medication Sig  . atorvastatin (LIPITOR) 10 MG tablet Take 1 tablet (10 mg total) by mouth daily.  . cholecalciferol (VITAMIN D) 1000 units tablet Take 1,000 Units by mouth daily.  . COMBIGAN 0.2-0.5 % ophthalmic solution Place 1 drop into both eyes 2 (two) times a day.   Marland Kitchen LUMIGAN 0.01 % SOLN Place 1 drop into both eyes daily.   . Multiple Vitamins-Minerals (MULTIVITAMIN WITH MINERALS) tablet Take 1 tablet by mouth daily. Women's Multivitamin 50+  . naproxen sodium (ALEVE) 220 MG tablet Take 440 mg by mouth every 8 (eight) hours.  Marland Kitchen olmesartan-hydrochlorothiazide (BENICAR HCT) 40-12.5 MG tablet Take 1 tablet by mouth daily.  Marland Kitchen venlafaxine XR (EFFEXOR-XR) 150 MG 24 hr capsule TAKE 1 CAPSULE(150 MG) BY MOUTH DAILY WITH BREAKFAST  . methocarbamol (ROBAXIN) 500 MG tablet Take 1 tablet (500 mg total) by mouth every 8 (eight) hours as needed for muscle spasms. (Patient not taking: Reported on 08/02/2019)  . [DISCONTINUED] aspirin 81 MG chewable tablet Chew 1 tablet (81 mg total) by mouth 2 (two) times daily.  (Patient not taking: Reported on 08/02/2019)  . [DISCONTINUED] ondansetron (ZOFRAN) 4 MG tablet Take 1 tablet (4 mg total) by mouth every 8 (eight) hours as needed for nausea or vomiting.  . [DISCONTINUED] oxyCODONE (OXY IR/ROXICODONE) 5 MG immediate release tablet Take 1-2 tablets (5-10 mg total) by mouth every 4 (four) hours as needed for moderate pain (pain score 4-6). (Patient not taking: Reported on 08/02/2019)  . [DISCONTINUED] oxyCODONE-acetaminophen (PERCOCET/ROXICET) 5-325 MG tablet Take 1-2 tablets by mouth every 6 (six) hours as needed for severe pain. (Patient not taking: Reported on 08/02/2019)  . [DISCONTINUED] oxyCODONE-acetaminophen (PERCOCET/ROXICET) 5-325 MG tablet Take 1 tablet by mouth every 8 (eight) hours as needed for severe pain. (Patient not taking: Reported on 08/02/2019)  . [DISCONTINUED] traMADol (ULTRAM) 50 MG tablet 1-2 tablets PO every 8 hours as needed for pain.   No facility-administered encounter medications on file as of 08/16/2019.    No Known Allergies  ROS: no fever, chills, URI symptoms, abdominal pain, vaginal discharge, back pain, hematuria, bleeding, bruising or other concerns.  Persistent knee pain since surgery.  Urinary complaints per HPI. Fatigue, related to interrupted sleep from nocturia.   PHYSICAL EXAM:  BP 140/84   Pulse 80   Temp (!) 97.5 F (36.4 C) (Other (Comment))   Ht 5\' 3"  (1.6 m)   Wt 138 lb 6.4 oz (62.8 kg)   BMI 24.52 kg/m   Well appearing, pleasant female, in  good spirits in no distress HEENT: conjunctiva and sclera are clear, EOMI. Wearing mask Neck; no lymphadenopathy or mass Back: no CVA tenderness Abdomen: soft, nontender, no mass Extremities: WHSS both knees, pinker (newer) on the left)  Urine dip: trace leuks, negative nitrite, protein, blood  ASSESSMENT/PLAN:  Bad odor of urine - urine dip only tr leuks. Culture sent. no change in vits/supplements/diet. Stay hydrated - Plan: Urine Culture  Urinary frequency -  including at night, interfering with sleep, x 9 weeks, since surgery. If no infection, consider OAB, stricture. Can try myrbetriq and urology if not effective - Plan: POCT Urinalysis DIP (Proadvantage Device), Urine Culture   Discussed hydration (drink fluids earlier in the day, to keep urine light color) Send culture  If culture is negative, consider urology evaluation vs trial of OAB treatments.  She has glaucoma, so would start with myrbetriq rather than other meds.  We may be able to supply samples (if culture negative and frequency persists), sending to urologist if ineffective or not tolerated.   [facility] [employer] [address]

## 2019-08-18 ENCOUNTER — Telehealth: Payer: Self-pay

## 2019-08-18 DIAGNOSIS — R262 Difficulty in walking, not elsewhere classified: Secondary | ICD-10-CM | POA: Diagnosis not present

## 2019-08-18 DIAGNOSIS — M6281 Muscle weakness (generalized): Secondary | ICD-10-CM | POA: Diagnosis not present

## 2019-08-18 DIAGNOSIS — Z96652 Presence of left artificial knee joint: Secondary | ICD-10-CM | POA: Diagnosis not present

## 2019-08-18 DIAGNOSIS — M25562 Pain in left knee: Secondary | ICD-10-CM | POA: Diagnosis not present

## 2019-08-18 NOTE — Telephone Encounter (Signed)
Spoke to pt and advised that culture was not back yet. Pt was advised to call back tomorrow to find out if results are back to confirm what if any med she will need. [place]

## 2019-08-19 ENCOUNTER — Encounter: Payer: Self-pay | Admitting: Family Medicine

## 2019-08-19 LAB — URINE CULTURE

## 2019-08-19 MED ORDER — NITROFURANTOIN MONOHYD MACRO 100 MG PO CAPS: 100.0000 mg | ORAL_CAPSULE | Freq: Two times a day (BID) | ORAL | 0 refills | Status: DC

## 2019-08-19 NOTE — Addendum Note (Signed)
Addended byRita Ohara on: 08/19/2019 01:33 PM   Modules accepted: Orders

## 2019-08-23 DIAGNOSIS — M6281 Muscle weakness (generalized): Secondary | ICD-10-CM | POA: Diagnosis not present

## 2019-08-23 DIAGNOSIS — R262 Difficulty in walking, not elsewhere classified: Secondary | ICD-10-CM | POA: Diagnosis not present

## 2019-08-23 DIAGNOSIS — M25562 Pain in left knee: Secondary | ICD-10-CM | POA: Diagnosis not present

## 2019-08-23 DIAGNOSIS — Z96652 Presence of left artificial knee joint: Secondary | ICD-10-CM | POA: Diagnosis not present

## 2019-08-25 DIAGNOSIS — R262 Difficulty in walking, not elsewhere classified: Secondary | ICD-10-CM | POA: Diagnosis not present

## 2019-08-25 DIAGNOSIS — Z96652 Presence of left artificial knee joint: Secondary | ICD-10-CM | POA: Diagnosis not present

## 2019-08-25 DIAGNOSIS — M6281 Muscle weakness (generalized): Secondary | ICD-10-CM | POA: Diagnosis not present

## 2019-08-25 DIAGNOSIS — M25562 Pain in left knee: Secondary | ICD-10-CM | POA: Diagnosis not present

## 2019-08-30 DIAGNOSIS — M25562 Pain in left knee: Secondary | ICD-10-CM | POA: Diagnosis not present

## 2019-08-30 DIAGNOSIS — H04221 Epiphora due to insufficient drainage, right lacrimal gland: Secondary | ICD-10-CM | POA: Diagnosis not present

## 2019-08-30 DIAGNOSIS — R262 Difficulty in walking, not elsewhere classified: Secondary | ICD-10-CM | POA: Diagnosis not present

## 2019-08-30 DIAGNOSIS — M6281 Muscle weakness (generalized): Secondary | ICD-10-CM | POA: Diagnosis not present

## 2019-08-30 DIAGNOSIS — H40053 Ocular hypertension, bilateral: Secondary | ICD-10-CM | POA: Diagnosis not present

## 2019-08-30 DIAGNOSIS — H04123 Dry eye syndrome of bilateral lacrimal glands: Secondary | ICD-10-CM | POA: Diagnosis not present

## 2019-08-30 DIAGNOSIS — G510 Bell's palsy: Secondary | ICD-10-CM | POA: Diagnosis not present

## 2019-08-30 DIAGNOSIS — Z96652 Presence of left artificial knee joint: Secondary | ICD-10-CM | POA: Diagnosis not present

## 2019-08-30 DIAGNOSIS — H2513 Age-related nuclear cataract, bilateral: Secondary | ICD-10-CM | POA: Diagnosis not present

## 2019-08-30 DIAGNOSIS — H40033 Anatomical narrow angle, bilateral: Secondary | ICD-10-CM | POA: Diagnosis not present

## 2019-09-06 DIAGNOSIS — M6281 Muscle weakness (generalized): Secondary | ICD-10-CM | POA: Diagnosis not present

## 2019-09-06 DIAGNOSIS — R262 Difficulty in walking, not elsewhere classified: Secondary | ICD-10-CM | POA: Diagnosis not present

## 2019-09-06 DIAGNOSIS — M25562 Pain in left knee: Secondary | ICD-10-CM | POA: Diagnosis not present

## 2019-09-06 DIAGNOSIS — Z96652 Presence of left artificial knee joint: Secondary | ICD-10-CM | POA: Diagnosis not present

## 2019-09-13 DIAGNOSIS — Z96652 Presence of left artificial knee joint: Secondary | ICD-10-CM | POA: Diagnosis not present

## 2019-09-13 DIAGNOSIS — M6281 Muscle weakness (generalized): Secondary | ICD-10-CM | POA: Diagnosis not present

## 2019-09-13 DIAGNOSIS — M25562 Pain in left knee: Secondary | ICD-10-CM | POA: Diagnosis not present

## 2019-09-13 DIAGNOSIS — R262 Difficulty in walking, not elsewhere classified: Secondary | ICD-10-CM | POA: Diagnosis not present

## 2019-09-20 ENCOUNTER — Telehealth: Payer: Self-pay | Admitting: *Deleted

## 2019-09-20 NOTE — Care Plan (Signed)
RNCM call to patient to check on status at 3 months post-op for her left total knee replacement. She states she is doing well. She has completed OPPT and states, "it's still not 100%, but I'm happy with it". Reviewed 90 day survey with patient. See flowsheet for results. Also, discussed that she has no f/u with Dr. Durward Fortes. RNCM will check with him next week to see when he would like to see her back.

## 2019-09-20 NOTE — Telephone Encounter (Signed)
Ortho bundle 90 day call and survey completed. 

## 2019-10-05 IMAGING — MR MR ELBOW*R* W/O CM
4 of 5 series · 26 of 40 positions shown · non-contrast
Comparison: Left humerus x-rays dated May 01, 2008.

CLINICAL DATA: Lateral elbow pain for the past 2 months.

EXAM:
MRI OF THE RIGHT ELBOW WITHOUT CONTRAST
TECHNIQUE: Multiplanar, multisequence MR imaging of the elbow was performed. No
intravenous contrast was administered.

[Series 4: T1 · axial · 3.0mm · 0.23mm/px · z∈[-66,+13]mm · 3 of 28 slices shown]
[im 4/28]
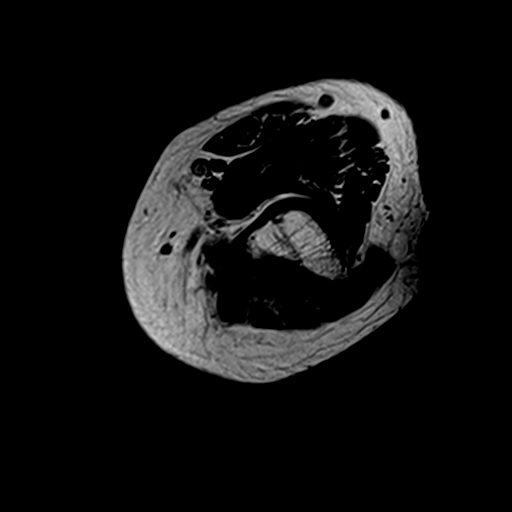
[im 16/28]
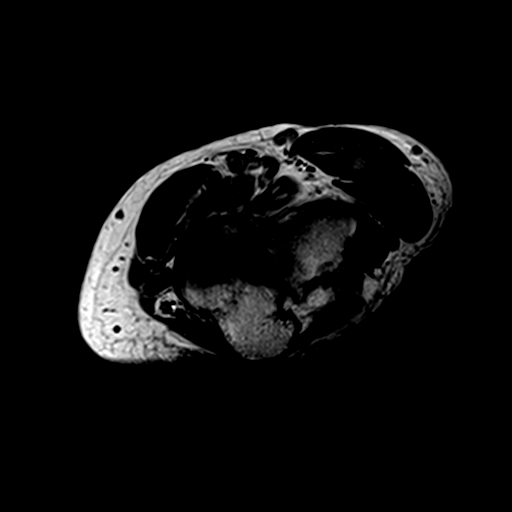
[im 24/28]
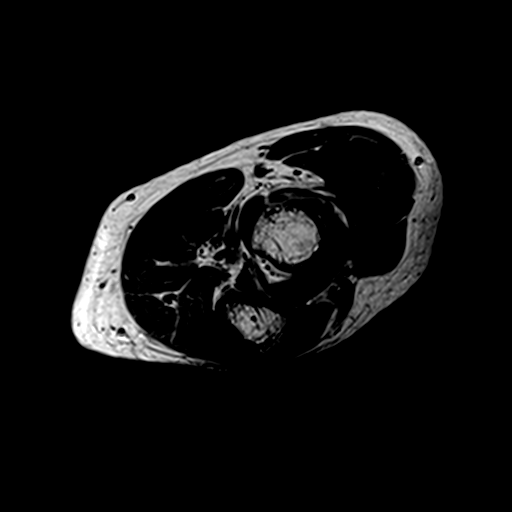

[Series 5: T2 fat-sat · axial · 3.0mm · 0.23mm/px · z∈[-78,+29]mm · 9 of 28 slices shown (1 of 2)]
[im 1/28]
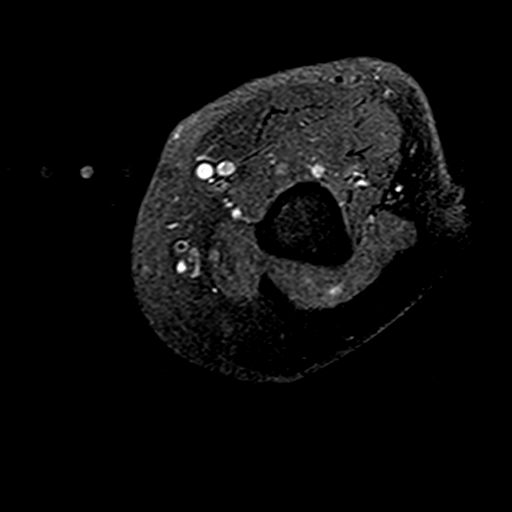
[im 4/28]
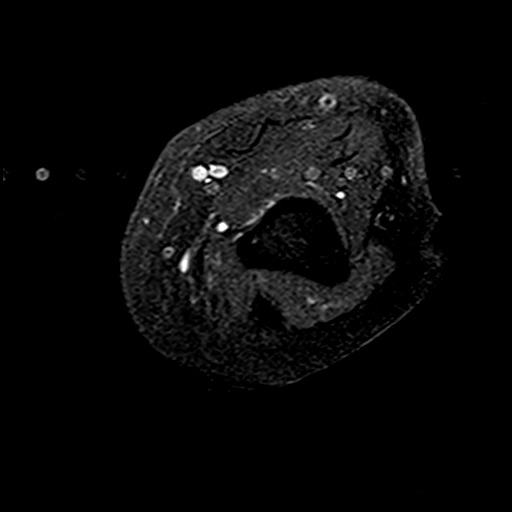
[im 7/28]
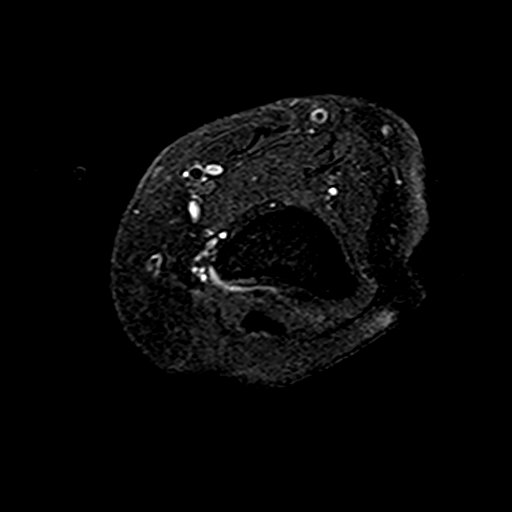
[im 11/28]
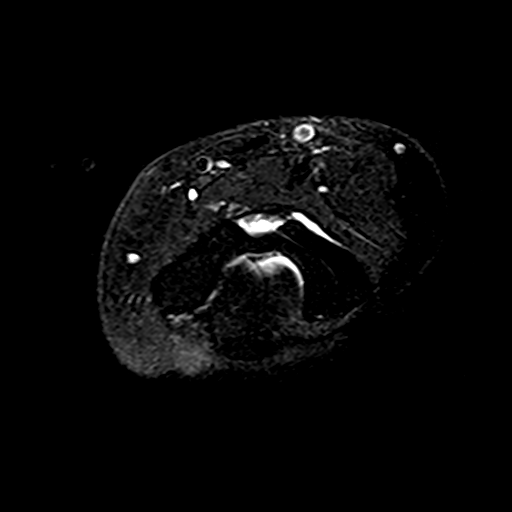
[im 14/28]
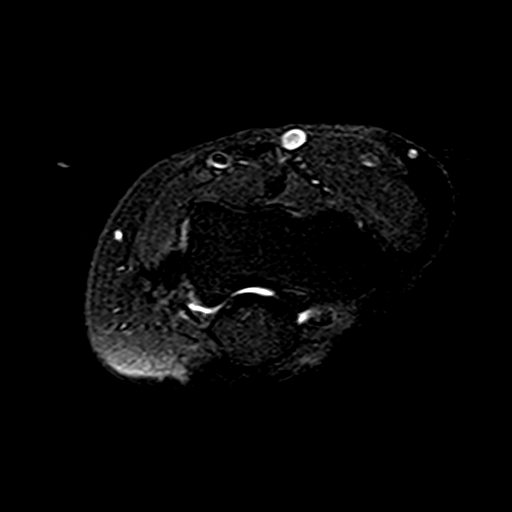
[im 17/28]
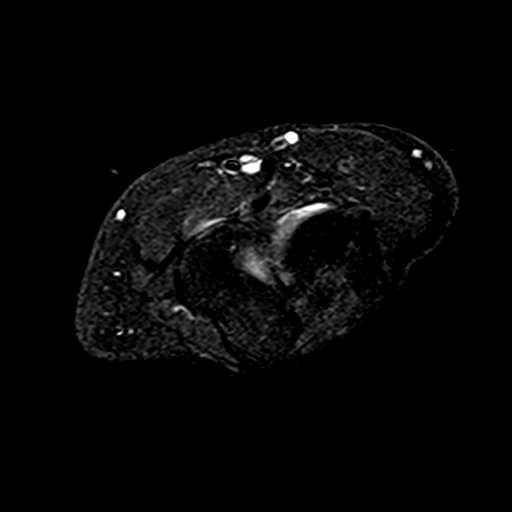
[im 21/28]
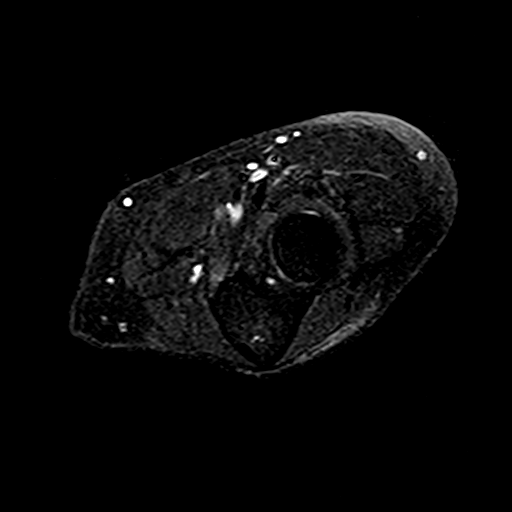
[im 24/28]
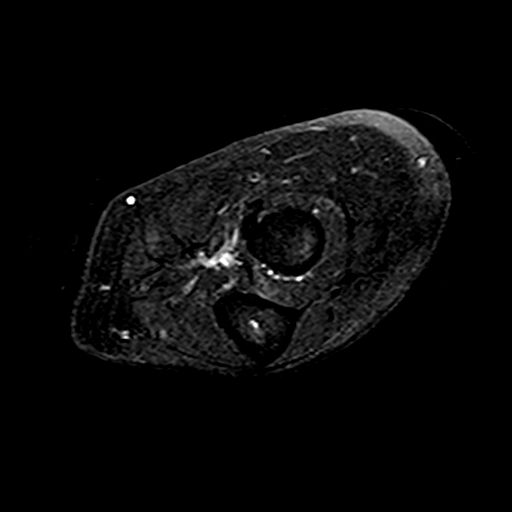
[im 28/28]
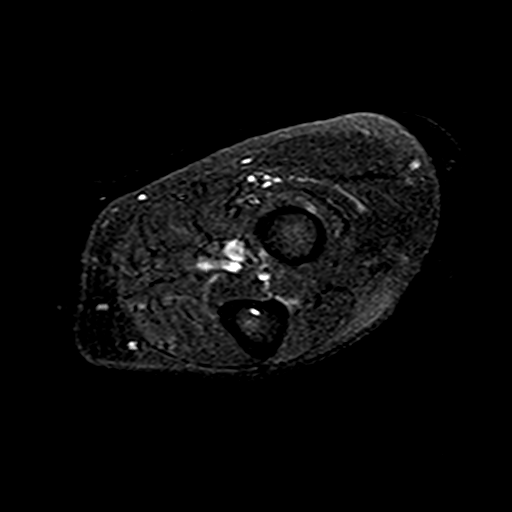

[Series 6: T2 fat-sat · coronal · 3.0mm · 0.27mm/px · 5 of 20 slices shown (2 of 2)]
[im 1/20]
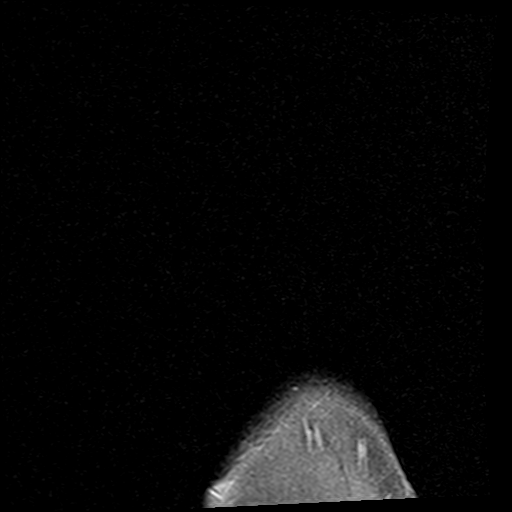
[im 4/20]
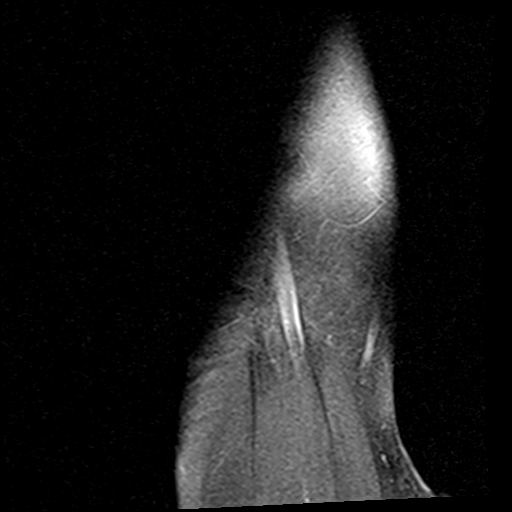
[im 7/20]
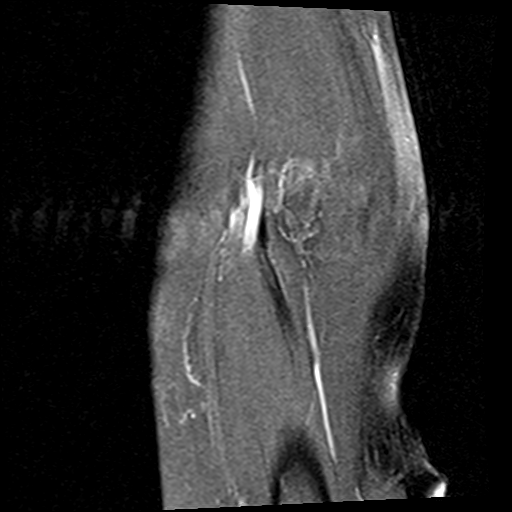
[im 10/20]
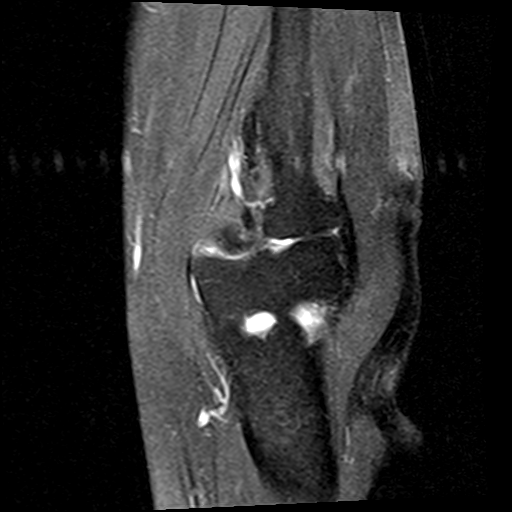
[im 16/20]
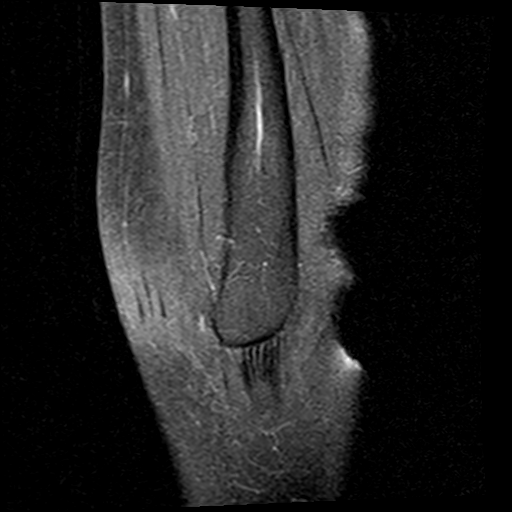

[Series 8: PD fat-sat · sagittal · 3.0mm · 0.55mm/px · 9 of 26 slices shown]
[im 1/26]
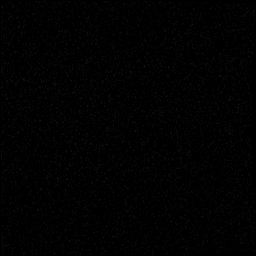
[im 4/26]
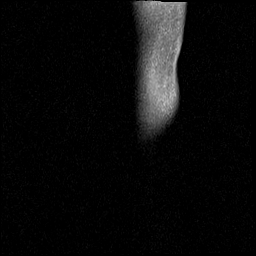
[im 7/26]
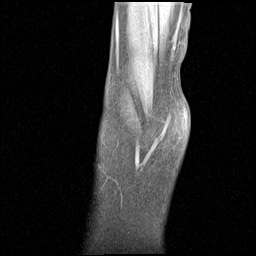
[im 10/26]
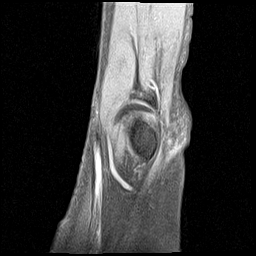
[im 13/26]
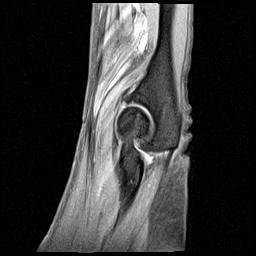
[im 16/26]
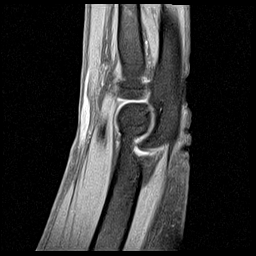
[im 19/26]
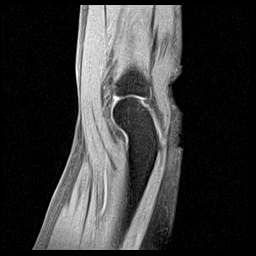
[im 22/26]
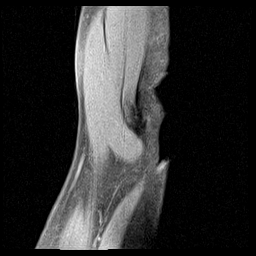
[im 26/26]
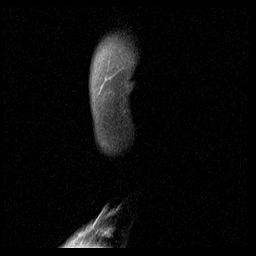

[26 of 40 positions shown; findings below may reference images not displayed]

FINDINGS: TENDONS

Common forearm flexor origin: Intact with normal signal.

Common forearm extensor origin: Intact with mild tendinosis.

Biceps: Intact.

Triceps: Intact with normal signal.

LIGAMENTS

Medial stabilizers: Intact.

Lateral stabilizers: The lateral ulnar and radial collateral
ligaments appear intact.

Cartilage: Preserved.  No focal chondral defect demonstrated.

Joint: No joint effusion or loose body observed.

Cubital tunnel: Unremarkable.  The ulnar nerve appears normal.

Bones: No acute or significant extra-articular osseous findings.

Other: None.
IMPRESSION: 1. Mild common forearm extensor tendinosis.  No tear.

## 2019-10-27 ENCOUNTER — Other Ambulatory Visit: Payer: Self-pay

## 2019-10-27 ENCOUNTER — Encounter: Payer: Self-pay | Admitting: Family Medicine

## 2019-10-27 ENCOUNTER — Ambulatory Visit (INDEPENDENT_AMBULATORY_CARE_PROVIDER_SITE_OTHER): Payer: Medicare Other | Admitting: Family Medicine

## 2019-10-27 ENCOUNTER — Ambulatory Visit: Payer: Medicare Other | Admitting: Family Medicine

## 2019-10-27 VITALS — BP 132/76 | HR 75 | Temp 99.7°F | Wt 139.0 lb

## 2019-10-27 DIAGNOSIS — R399 Unspecified symptoms and signs involving the genitourinary system: Secondary | ICD-10-CM

## 2019-10-27 LAB — POCT URINALYSIS DIP (PROADVANTAGE DEVICE)
Bilirubin, UA: NEGATIVE
Blood, UA: NEGATIVE
Glucose, UA: NEGATIVE mg/dL
Ketones, POC UA: NEGATIVE mg/dL
Leukocytes, UA: NEGATIVE
Nitrite, UA: NEGATIVE
Protein Ur, POC: NEGATIVE mg/dL
Specific Gravity, Urine: 1.015
Urobilinogen, Ur: 0.2
pH, UA: 7.5 (ref 5.0–8.0)

## 2019-10-27 MED ORDER — SULFAMETHOXAZOLE-TRIMETHOPRIM 800-160 MG PO TABS: 1.0000 | ORAL_TABLET | Freq: Two times a day (BID) | ORAL | 0 refills | Status: DC

## 2019-10-27 NOTE — Progress Notes (Signed)
   Subjective:    Patient ID: Jodi Duffy, female    DOB: 08-13-1951, 68 y.o.   MRN: [id number]  HPI She is here for consult concerning another episode of foul-smelling urine.  She states that over the last 6 months she has had at least 3 of these.  She was seen in September by Dr. Tomi Bamberger.  The urinalysis was essentially negative however the culture did show E. coli.  She was treated with Macrodantin.  She has no frequency, urgency, abdominal pain, fever.  Review of Systems     Objective:   Physical Exam Alert and in no distress.  Urine microscopic did show bacteria but no other issues.       Assessment & Plan:  UTI symptoms - Plan: POCT Urinalysis DIP (Proadvantage Device), sulfamethoxazole-trimethoprim (BACTRIM DS) 800-160 MG tablet, Ambulatory referral to Urology Since she has had difficulty with this over the last 6 months and has had at least 3 of these episodes and no previous difficulty, urology evaluation is warranted

## 2019-12-07 ENCOUNTER — Ambulatory Visit: Payer: Medicare Other | Attending: Internal Medicine

## 2019-12-07 DIAGNOSIS — Z23 Encounter for immunization: Secondary | ICD-10-CM | POA: Diagnosis not present

## 2019-12-07 NOTE — Progress Notes (Signed)
   Covid-19 Vaccination Clinic  Name:  Jodi Duffy    MRN: [id number] DOB: September 19, 1951  12/07/2019  [name] was observed post Covid-19 immunization for 15 minutes without incidence. She was provided with Vaccine Information Sheet and instruction to access the V-Safe system.   [name] was instructed to call 911 with any severe reactions post vaccine: Marland Kitchen Difficulty breathing  . Swelling of your face and throat  . A fast heartbeat  . A bad rash all over your body  . Dizziness and weakness    Immunizations Administered    Name Date Dose VIS Date Route   Pfizer COVID-19 Vaccine 12/07/2019 12:28 PM 0.3 mL 10/28/2019 Intramuscular   Manufacturer: [place]   Lot: [id number]   [place]: [id number]

## 2019-12-10 DIAGNOSIS — Z20828 Contact with and (suspected) exposure to other viral communicable diseases: Secondary | ICD-10-CM | POA: Diagnosis not present

## 2019-12-25 ENCOUNTER — Ambulatory Visit: Payer: Medicare Other | Attending: Internal Medicine

## 2019-12-25 DIAGNOSIS — Z23 Encounter for immunization: Secondary | ICD-10-CM

## 2019-12-25 NOTE — Progress Notes (Signed)
   Covid-19 Vaccination Clinic  Name:  Jodi Duffy    MRN: [id number] DOB: Nov 16, 1951  12/25/2019  Jodi Duffy was observed post Covid-19 immunization for 15 minutes without incidence. She was provided with Vaccine Information Sheet and instruction to access the V-Safe system.   Jodi Duffy was instructed to call 911 with any severe reactions post vaccine: Marland Kitchen Difficulty breathing  . Swelling of your face and throat  . A fast heartbeat  . A bad rash all over your body  . Dizziness and weakness    Immunizations Administered    Name Date Dose VIS Date Route   Pfizer COVID-19 Vaccine 12/25/2019 10:02 AM 0.3 mL 10/28/2019 Intramuscular   Manufacturer: [place]   Lot: [id number]   [place]: [id number]

## 2019-12-27 DIAGNOSIS — R208 Other disturbances of skin sensation: Secondary | ICD-10-CM | POA: Diagnosis not present

## 2019-12-27 DIAGNOSIS — L820 Inflamed seborrheic keratosis: Secondary | ICD-10-CM | POA: Diagnosis not present

## 2020-01-09 ENCOUNTER — Ambulatory Visit: Payer: Medicare Other

## 2020-01-13 DIAGNOSIS — R208 Other disturbances of skin sensation: Secondary | ICD-10-CM | POA: Diagnosis not present

## 2020-01-13 DIAGNOSIS — L820 Inflamed seborrheic keratosis: Secondary | ICD-10-CM | POA: Diagnosis not present

## 2020-02-08 DIAGNOSIS — H25813 Combined forms of age-related cataract, bilateral: Secondary | ICD-10-CM | POA: Diagnosis not present

## 2020-02-14 DIAGNOSIS — H209 Unspecified iridocyclitis: Secondary | ICD-10-CM | POA: Diagnosis not present

## 2020-02-14 DIAGNOSIS — H1013 Acute atopic conjunctivitis, bilateral: Secondary | ICD-10-CM | POA: Diagnosis not present

## 2020-02-14 DIAGNOSIS — H40053 Ocular hypertension, bilateral: Secondary | ICD-10-CM | POA: Diagnosis not present

## 2020-02-24 DIAGNOSIS — H40053 Ocular hypertension, bilateral: Secondary | ICD-10-CM | POA: Diagnosis not present

## 2020-02-24 DIAGNOSIS — H04221 Epiphora due to insufficient drainage, right lacrimal gland: Secondary | ICD-10-CM | POA: Diagnosis not present

## 2020-02-24 DIAGNOSIS — H40033 Anatomical narrow angle, bilateral: Secondary | ICD-10-CM | POA: Diagnosis not present

## 2020-02-24 DIAGNOSIS — H1013 Acute atopic conjunctivitis, bilateral: Secondary | ICD-10-CM | POA: Diagnosis not present

## 2020-02-24 DIAGNOSIS — H2513 Age-related nuclear cataract, bilateral: Secondary | ICD-10-CM | POA: Diagnosis not present

## 2020-02-24 DIAGNOSIS — H209 Unspecified iridocyclitis: Secondary | ICD-10-CM | POA: Diagnosis not present

## 2020-02-24 DIAGNOSIS — H04123 Dry eye syndrome of bilateral lacrimal glands: Secondary | ICD-10-CM | POA: Diagnosis not present

## 2020-02-24 DIAGNOSIS — G510 Bell's palsy: Secondary | ICD-10-CM | POA: Diagnosis not present

## 2020-02-27 DIAGNOSIS — H04221 Epiphora due to insufficient drainage, right lacrimal gland: Secondary | ICD-10-CM | POA: Diagnosis not present

## 2020-02-27 DIAGNOSIS — H209 Unspecified iridocyclitis: Secondary | ICD-10-CM | POA: Diagnosis not present

## 2020-02-27 DIAGNOSIS — H04123 Dry eye syndrome of bilateral lacrimal glands: Secondary | ICD-10-CM | POA: Diagnosis not present

## 2020-02-27 DIAGNOSIS — H2513 Age-related nuclear cataract, bilateral: Secondary | ICD-10-CM | POA: Diagnosis not present

## 2020-02-27 DIAGNOSIS — H1013 Acute atopic conjunctivitis, bilateral: Secondary | ICD-10-CM | POA: Diagnosis not present

## 2020-02-27 DIAGNOSIS — H40053 Ocular hypertension, bilateral: Secondary | ICD-10-CM | POA: Diagnosis not present

## 2020-02-27 DIAGNOSIS — H40033 Anatomical narrow angle, bilateral: Secondary | ICD-10-CM | POA: Diagnosis not present

## 2020-02-27 DIAGNOSIS — G510 Bell's palsy: Secondary | ICD-10-CM | POA: Diagnosis not present

## 2020-03-13 DIAGNOSIS — H209 Unspecified iridocyclitis: Secondary | ICD-10-CM | POA: Diagnosis not present

## 2020-04-03 DIAGNOSIS — J309 Allergic rhinitis, unspecified: Secondary | ICD-10-CM | POA: Diagnosis not present

## 2020-04-03 DIAGNOSIS — H1045 Other chronic allergic conjunctivitis: Secondary | ICD-10-CM | POA: Diagnosis not present

## 2020-04-03 DIAGNOSIS — L2089 Other atopic dermatitis: Secondary | ICD-10-CM | POA: Diagnosis not present

## 2020-04-11 DIAGNOSIS — J309 Allergic rhinitis, unspecified: Secondary | ICD-10-CM | POA: Diagnosis not present

## 2020-04-24 DIAGNOSIS — H40053 Ocular hypertension, bilateral: Secondary | ICD-10-CM | POA: Diagnosis not present

## 2020-04-24 DIAGNOSIS — H209 Unspecified iridocyclitis: Secondary | ICD-10-CM | POA: Diagnosis not present

## 2020-04-24 DIAGNOSIS — H40033 Anatomical narrow angle, bilateral: Secondary | ICD-10-CM | POA: Diagnosis not present

## 2020-04-24 DIAGNOSIS — H2513 Age-related nuclear cataract, bilateral: Secondary | ICD-10-CM | POA: Diagnosis not present

## 2020-04-27 DIAGNOSIS — H209 Unspecified iridocyclitis: Secondary | ICD-10-CM | POA: Diagnosis not present

## 2020-04-28 ENCOUNTER — Other Ambulatory Visit: Payer: Self-pay | Admitting: Family Medicine

## 2020-04-28 DIAGNOSIS — I10 Essential (primary) hypertension: Secondary | ICD-10-CM

## 2020-06-12 DIAGNOSIS — H43821 Vitreomacular adhesion, right eye: Secondary | ICD-10-CM | POA: Diagnosis not present

## 2020-06-12 DIAGNOSIS — H2513 Age-related nuclear cataract, bilateral: Secondary | ICD-10-CM | POA: Diagnosis not present

## 2020-06-12 DIAGNOSIS — H44113 Panuveitis, bilateral: Secondary | ICD-10-CM | POA: Diagnosis not present

## 2020-06-12 DIAGNOSIS — H30033 Focal chorioretinal inflammation, peripheral, bilateral: Secondary | ICD-10-CM | POA: Diagnosis not present

## 2020-06-21 ENCOUNTER — Telehealth: Payer: Self-pay | Admitting: *Deleted

## 2020-06-21 NOTE — Telephone Encounter (Signed)
Attempted 1 year post-op call to patient. Left VM requesting call back.

## 2020-06-26 DIAGNOSIS — H44113 Panuveitis, bilateral: Secondary | ICD-10-CM | POA: Diagnosis not present

## 2020-06-26 DIAGNOSIS — H43821 Vitreomacular adhesion, right eye: Secondary | ICD-10-CM | POA: Diagnosis not present

## 2020-06-26 DIAGNOSIS — H2513 Age-related nuclear cataract, bilateral: Secondary | ICD-10-CM | POA: Diagnosis not present

## 2020-06-26 DIAGNOSIS — H30033 Focal chorioretinal inflammation, peripheral, bilateral: Secondary | ICD-10-CM | POA: Diagnosis not present

## 2020-06-28 ENCOUNTER — Other Ambulatory Visit: Payer: Self-pay

## 2020-06-28 ENCOUNTER — Ambulatory Visit (INDEPENDENT_AMBULATORY_CARE_PROVIDER_SITE_OTHER): Payer: Medicare Other | Admitting: Family Medicine

## 2020-06-28 ENCOUNTER — Encounter: Payer: Self-pay | Admitting: Family Medicine

## 2020-06-28 VITALS — BP 142/86 | HR 87 | Temp 98.6°F | Wt 147.4 lb

## 2020-06-28 DIAGNOSIS — R399 Unspecified symptoms and signs involving the genitourinary system: Secondary | ICD-10-CM | POA: Diagnosis not present

## 2020-06-28 DIAGNOSIS — N3000 Acute cystitis without hematuria: Secondary | ICD-10-CM

## 2020-06-28 LAB — POCT URINALYSIS DIP (PROADVANTAGE DEVICE)
Bilirubin, UA: NEGATIVE
Blood, UA: NEGATIVE
Glucose, UA: NEGATIVE mg/dL
Ketones, POC UA: NEGATIVE mg/dL
Leukocytes, UA: NEGATIVE
Nitrite, UA: POSITIVE — AB
Protein Ur, POC: NEGATIVE mg/dL
Specific Gravity, Urine: 1.02
Urobilinogen, Ur: 0.2
pH, UA: 6 (ref 5.0–8.0)

## 2020-06-28 MED ORDER — SULFAMETHOXAZOLE-TRIMETHOPRIM 800-160 MG PO TABS: 1.0000 | ORAL_TABLET | Freq: Two times a day (BID) | ORAL | 0 refills | Status: DC

## 2020-06-28 NOTE — Progress Notes (Signed)
   Subjective:    Patient ID: Jodi Duffy, female    DOB: 1951-01-27, 69 y.o.   MRN: [id number]  HPI She states that when she notes an abnormal odor to her urine she has a bladder infection.  She has had 3 of these this year already.  In the past with discussed referral to urology but she is reluctant because of difficulty with treatment of uveitis requiring DMARDs.  She wants to get that resolved before pursuing any other problems.   Review of Systems     Objective:   Physical Exam Alert and in no distress otherwise not examined urine microscopic is positive.       Assessment & Plan:  UTI symptoms - Plan: POCT Urinalysis DIP (Proadvantage Device), sulfamethoxazole-trimethoprim (BACTRIM DS) 800-160 MG tablet  Acute cystitis without hematuria I again stressed the desire for her to see urology about this but she wants to wait until the eye issue is squared away.

## 2020-07-03 ENCOUNTER — Other Ambulatory Visit: Payer: Self-pay | Admitting: Family Medicine

## 2020-07-03 DIAGNOSIS — E785 Hyperlipidemia, unspecified: Secondary | ICD-10-CM

## 2020-07-03 DIAGNOSIS — Z8249 Family history of ischemic heart disease and other diseases of the circulatory system: Secondary | ICD-10-CM

## 2020-07-11 DIAGNOSIS — H04123 Dry eye syndrome of bilateral lacrimal glands: Secondary | ICD-10-CM | POA: Diagnosis not present

## 2020-07-11 DIAGNOSIS — H40033 Anatomical narrow angle, bilateral: Secondary | ICD-10-CM | POA: Diagnosis not present

## 2020-07-11 DIAGNOSIS — H2513 Age-related nuclear cataract, bilateral: Secondary | ICD-10-CM | POA: Diagnosis not present

## 2020-07-11 DIAGNOSIS — H40053 Ocular hypertension, bilateral: Secondary | ICD-10-CM | POA: Diagnosis not present

## 2020-07-11 DIAGNOSIS — H209 Unspecified iridocyclitis: Secondary | ICD-10-CM | POA: Diagnosis not present

## 2020-07-11 DIAGNOSIS — H1013 Acute atopic conjunctivitis, bilateral: Secondary | ICD-10-CM | POA: Diagnosis not present

## 2020-07-11 DIAGNOSIS — H04221 Epiphora due to insufficient drainage, right lacrimal gland: Secondary | ICD-10-CM | POA: Diagnosis not present

## 2020-07-11 DIAGNOSIS — G510 Bell's palsy: Secondary | ICD-10-CM | POA: Diagnosis not present

## 2020-07-13 ENCOUNTER — Telehealth: Payer: Self-pay | Admitting: *Deleted

## 2020-07-13 NOTE — Telephone Encounter (Signed)
Ortho bundle 1 year call completed. ?

## 2020-07-24 DIAGNOSIS — Z1231 Encounter for screening mammogram for malignant neoplasm of breast: Secondary | ICD-10-CM | POA: Diagnosis not present

## 2020-07-24 LAB — HM MAMMOGRAPHY

## 2020-08-02 ENCOUNTER — Ambulatory Visit: Payer: Medicare Other | Admitting: Family Medicine

## 2020-08-08 DIAGNOSIS — Z01419 Encounter for gynecological examination (general) (routine) without abnormal findings: Secondary | ICD-10-CM | POA: Diagnosis not present

## 2020-08-13 ENCOUNTER — Telehealth: Payer: Self-pay | Admitting: Orthopaedic Surgery

## 2020-08-13 NOTE — Telephone Encounter (Signed)
Please advise 

## 2020-08-13 NOTE — Telephone Encounter (Signed)
Pt states she has a dentist appt coming up and her dentist wants to know if Dr.Whitfield will want the pt to take a profilactin? Pt would like a CB with answer  [phone]

## 2020-08-14 ENCOUNTER — Other Ambulatory Visit: Payer: Self-pay

## 2020-08-14 DIAGNOSIS — H43821 Vitreomacular adhesion, right eye: Secondary | ICD-10-CM | POA: Diagnosis not present

## 2020-08-14 DIAGNOSIS — Z79899 Other long term (current) drug therapy: Secondary | ICD-10-CM | POA: Diagnosis not present

## 2020-08-14 DIAGNOSIS — H30033 Focal chorioretinal inflammation, peripheral, bilateral: Secondary | ICD-10-CM | POA: Diagnosis not present

## 2020-08-14 DIAGNOSIS — H44113 Panuveitis, bilateral: Secondary | ICD-10-CM | POA: Diagnosis not present

## 2020-08-14 DIAGNOSIS — H2513 Age-related nuclear cataract, bilateral: Secondary | ICD-10-CM | POA: Diagnosis not present

## 2020-08-14 MED ORDER — AMOXICILLIN 500 MG PO TABS: ORAL_TABLET | ORAL | 0 refills | Status: DC

## 2020-08-14 NOTE — Telephone Encounter (Signed)
Called and spoke with patient. Amoxicillin has been sent to her pharmacy. She was made aware that she is to take 4 tablets one hour before the procedure. I also explained that I sent in 12 tablets so she would have extra for her next visits.

## 2020-08-14 NOTE — Telephone Encounter (Signed)
Needs antibiotic coverage per protocol

## 2020-08-15 DIAGNOSIS — Z23 Encounter for immunization: Secondary | ICD-10-CM | POA: Diagnosis not present

## 2020-08-21 ENCOUNTER — Other Ambulatory Visit: Payer: Self-pay

## 2020-08-21 ENCOUNTER — Ambulatory Visit (INDEPENDENT_AMBULATORY_CARE_PROVIDER_SITE_OTHER): Payer: Medicare Other | Admitting: Family Medicine

## 2020-08-21 VITALS — BP 148/82 | HR 73 | Temp 98.4°F | Ht 63.0 in | Wt 148.2 lb

## 2020-08-21 DIAGNOSIS — J302 Other seasonal allergic rhinitis: Secondary | ICD-10-CM | POA: Diagnosis not present

## 2020-08-21 DIAGNOSIS — Z803 Family history of malignant neoplasm of breast: Secondary | ICD-10-CM

## 2020-08-21 DIAGNOSIS — H409 Unspecified glaucoma: Secondary | ICD-10-CM

## 2020-08-21 DIAGNOSIS — E785 Hyperlipidemia, unspecified: Secondary | ICD-10-CM

## 2020-08-21 DIAGNOSIS — I10 Essential (primary) hypertension: Secondary | ICD-10-CM

## 2020-08-21 DIAGNOSIS — Z96652 Presence of left artificial knee joint: Secondary | ICD-10-CM

## 2020-08-21 DIAGNOSIS — Z8249 Family history of ischemic heart disease and other diseases of the circulatory system: Secondary | ICD-10-CM

## 2020-08-21 DIAGNOSIS — H44113 Panuveitis, bilateral: Secondary | ICD-10-CM | POA: Diagnosis not present

## 2020-08-21 DIAGNOSIS — R7309 Other abnormal glucose: Secondary | ICD-10-CM | POA: Diagnosis not present

## 2020-08-21 DIAGNOSIS — F341 Dysthymic disorder: Secondary | ICD-10-CM | POA: Diagnosis not present

## 2020-08-21 DIAGNOSIS — Z96651 Presence of right artificial knee joint: Secondary | ICD-10-CM

## 2020-08-21 DIAGNOSIS — Z23 Encounter for immunization: Secondary | ICD-10-CM | POA: Diagnosis not present

## 2020-08-21 MED ORDER — OLMESARTAN MEDOXOMIL-HCTZ 40-12.5 MG PO TABS: 1.0000 | ORAL_TABLET | Freq: Every day | ORAL | 0 refills | Status: DC

## 2020-08-21 MED ORDER — VENLAFAXINE HCL ER 150 MG PO CP24: ORAL_CAPSULE | ORAL | 3 refills | Status: DC

## 2020-08-21 NOTE — Progress Notes (Signed)
Jodi Duffy is a 69 y.o. female who presents for annual wellness visit and follow-up on chronic medical conditions.  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  She also has been having a lot of eye problems and is seeing multiple eye doctors however her vision does seem to be doing much better.  She also has a history of glaucoma.  She continues to do nicely on her Effexor for the dysthymia and is not interested in stopping.  Her knee is doing much better after she has had the TKR.  She continues on atorvastatin and is having no difficulty with that.  She is also taking olmesartan/HCTZ for her blood pressure.  Her allergies are causing very little difficulty.  Otherwise she has no particular concerns or complaints.  Immunizations and Health Maintenance Immunization History  Administered Date(s) Administered  . DTaP 08/02/1998  . Fluad Quad(high Dose 65+) 08/02/2019  . Influenza Split 08/11/2012  . Influenza Whole 09/04/2008, 08/27/2010  . Influenza, High Dose Seasonal PF 09/06/2014, 08/07/2018  . Influenza,inj,Quad PF,6+ Mos 08/08/2013  . Influenza-Unspecified 08/17/2014, 08/18/2015, 08/17/2016, 08/17/2017, 08/18/2019  . PFIZER SARS-COV-2 Vaccination 12/07/2019, 12/25/2019  . Pneumococcal Conjugate-13 11/15/2015  . Pneumococcal Polysaccharide-23 08/02/1998, 11/15/2012  . Tdap 10/04/2008, 06/21/2018  . Zoster 08/27/2010  . Zoster Recombinat (Shingrix) 02/09/2017, 04/30/2017   Health Maintenance Due  Topic Date Due  . INFLUENZA VACCINE  06/17/2020  . MAMMOGRAM  07/18/2020    Last Pap smear: aged out Last mammogram: 9/21 Last colonoscopy: 04/13/18 cologuard Last DEXA: 08/11/19 Dentist: Randol Kern Ophtho:Groat Manuella Ghazi Exercise: work out at gym 30 min 3 days a week  Other doctors caring for patient include: Dr. Katy Fitch and Dr. Patrecia Pace,  Advanced directives: Does Patient Have a Medical  Advance Directive?: Yes Type of Advance Directive: [place] Does patient want to make changes to medical advance directive?: No - Patient declined Copy of [place] in Chart?: Yes - validated most recent copy scanned in chart (See row information)  Depression screen:  See questionnaire below.  Depression screen [facility] 2/9 08/21/2020 08/02/2019 02/10/2018 02/09/2017 11/15/2015  Decreased Interest 0 0 0 0 0  Down, Depressed, Hopeless 0 - 0 0 0  PHQ - 2 Score 0 0 0 0 0    Fall Risk Screen: see questionnaire below. Fall Risk  08/21/2020 08/02/2019 05/16/2019 05/13/2018 02/10/2018  Falls in the past year? 0 0 0 No -  Number falls in past yr: - - 0 - 2 or more  Injury with Fall? - - 0 - No  Risk for fall due to : No Fall Risks - - - Impaired balance/gait    ADL screen:  See questionnaire below Functional Status Survey: Is the patient deaf or have difficulty hearing?: No Does the patient have difficulty seeing, even when wearing glasses/contacts?: No Does the patient have difficulty concentrating, remembering, or making decisions?: No Does the patient have difficulty walking or climbing stairs?: No Does the patient have difficulty dressing or bathing?: No Does the patient have difficulty doing errands alone such as visiting a doctor's office or shopping?: No   Review of Systems Constitutional: -, -unexpected weight change, -anorexia, -fatigue Allergy: -sneezing, -itching, -congestion Dermatology: denies changing moles, rash, lumps ENT: -runny nose, -ear pain, -sore throat,  Cardiology:  -chest pain, -palpitations, -orthopnea, Respiratory: -cough, -shortness of breath, -dyspnea on exertion, -wheezing,  Gastroenterology: -abdominal pain, -  nausea, -vomiting, -diarrhea, -constipation, -dysphagia Hematology: -bleeding or bruising problems Musculoskeletal: -arthralgias, -myalgias, -joint swelling, -back pain, - Ophthalmology: -vision changes,  Urology: -dysuria,  -difficulty urinating,  -urinary frequency, -urgency, incontinence Neurology: -, -numbness, , -memory loss, -falls, -dizziness    PHYSICAL EXAM:   General Appearance: Alert, cooperative, no distress, appears stated age Head: Normocephalic, without obvious abnormality, atraumatic Eyes: PERRL, conjunctiva/corneas clear, EOM's intact,  Ears: Normal TM's and external ear canals Nose: Nares normal, mucosa normal, no drainage or sinus tenderness Throat: Lips, mucosa, and tongue normal; teeth and gums normal Neck: Supple, no lymphadenopathy;  thyroid:  no enlargement/tenderness/nodules; no carotid bruit or JVD Lungs: Clear to auscultation bilaterally without wheezes, rales or ronchi; respirations unlabored Heart: Regular rate and rhythm, S1 and S2 normal, no murmur, rubor gallop  Extremities: No clubbing, cyanosis or edema Pulses: 2+ and symmetric all extremities Skin:  Skin color, texture, turgor normal, no rashes or lesions Lymph nodes: Cervical, supraclavicular, and axillary nodes normal Neurologic:  CNII-XII intact, normal strength, sensation and gait; reflexes 2+ and symmetric throughout Psych: Normal mood, affect, hygiene and grooming.  ASSESSMENT/PLAN: Essential hypertension - Plan: CBC with Differential/Platelet, Comprehensive metabolic panel, olmesartan-hydrochlorothiazide (BENICAR HCT) 40-12.5 MG tablet  Status post total right knee replacement  Glaucoma, unspecified glaucoma type, unspecified laterality  Family history of breast cancer in mother  Other seasonal allergic rhinitis  Hyperlipidemia LDL goal <100 - Plan: Lipid panel  Dysthymia - Plan: venlafaxine XR (EFFEXOR-XR) 150 MG 24 hr capsule  Total knee replacement status, left  Family history of heart disease in female family member before age 73 - Plan: CBC with Differential/Platelet, Comprehensive metabolic panel, Lipid panel  Need for influenza vaccination - Plan: Flu Vaccine QUAD High Dose(Fluad) Her  immunizations were updated.  She will continue to take good care of her knee with physical activity.  Allergies are causing very little difficulty so she will continue on OTC meds as needed.  She has had mammograms routinely.  Immunization recommendations discussed.  Colonoscopy recommendations reviewed she will continue to be followed closely by ophthalmology  Medicare Attestation I have personally reviewed: The patient's medical and social history Their use of alcohol, tobacco or illicit drugs Their current medications and supplements The patient's functional ability including ADLs,fall risks, home safety risks, cognitive, and hearing and visual impairment Diet and physical activities Evidence for depression or mood disorders  The patient's weight, height, and BMI have been recorded in the chart.  I have made referrals, counseling, and provided education to the patient based on review of the above and I have provided the patient with a written personalized care plan for preventive services.     Jill Alexanders, MD   08/21/2020

## 2020-08-21 NOTE — Patient Instructions (Signed)
  Ms. Hurrell , Thank you for taking time to come for your Medicare Wellness Visit. I appreciate your ongoing commitment to your health goals. Please review the following plan we discussed and let me know if I can assist you in the future.   These are the goals we discussed: Goals   None     This is a list of the screening recommended for you and due dates:  Health Maintenance  Topic Date Due  . Flu Shot  06/17/2020  . Mammogram  07/18/2020  . Cologuard (Stool DNA test)  03/24/2021  . Tetanus Vaccine  06/21/2028  . DEXA scan (bone density measurement)  Completed  . COVID-19 Vaccine  Completed  .  Hepatitis C: One time screening is recommended by Center for Disease Control  (CDC) for  adults born from [birth year] through 1967.   Completed  . Pneumonia vaccines  Discontinued

## 2020-08-22 LAB — COMPREHENSIVE METABOLIC PANEL
ALT: 16 IU/L (ref 0–32)
AST: 18 IU/L (ref 0–40)
Albumin/Globulin Ratio: 2.1 (ref 1.2–2.2)
Albumin: 4.6 g/dL (ref 3.7–4.7)
Alkaline Phosphatase: 72 IU/L (ref 44–121)
BUN/Creatinine Ratio: 25 (ref 12–28)
BUN: 15 mg/dL (ref 8–27)
Bilirubin Total: <0.2 mg/dL (ref 0.0–1.2)
CO2: 27 mmol/L (ref 20–29)
Calcium: 10 mg/dL (ref 8.7–10.3)
Chloride: 99 mmol/L (ref 96–106)
Creatinine, Ser: 0.59 mg/dL (ref 0.57–1.00)
GFR calc Af Amer: 107 mL/min/{1.73_m2} (ref >59)
GFR calc non Af Amer: 93 mL/min/{1.73_m2} (ref >59)
Globulin, Total: 2.2 g/dL (ref 1.5–4.5)
Glucose: 110 mg/dL — ABNORMAL HIGH (ref 65–99)
Potassium: 5.2 mmol/L (ref 3.5–5.2)
Sodium: 139 mmol/L (ref 134–144)
Total Protein: 6.8 g/dL (ref 6.0–8.5)

## 2020-08-22 LAB — CBC WITH DIFFERENTIAL/PLATELET
Basophils Absolute: 0 10*3/uL (ref 0.0–0.2)
Basos: 1 %
EOS (ABSOLUTE): 0.1 10*3/uL (ref 0.0–0.4)
Eos: 1 %
Hematocrit: 39 % (ref 34.0–46.6)
Hemoglobin: 13 g/dL (ref 11.1–15.9)
Immature Grans (Abs): 0 10*3/uL (ref 0.0–0.1)
Immature Granulocytes: 0 %
Lymphocytes Absolute: 1.5 10*3/uL (ref 0.7–3.1)
Lymphs: 33 %
MCH: 31 pg (ref 26.6–33.0)
MCHC: 33.3 g/dL (ref 31.5–35.7)
MCV: 93 fL (ref 79–97)
Monocytes Absolute: 0.5 10*3/uL (ref 0.1–0.9)
Monocytes: 10 %
Neutrophils Absolute: 2.5 10*3/uL (ref 1.4–7.0)
Neutrophils: 55 %
Platelets: 239 10*3/uL (ref 150–450)
RBC: 4.19 x10E6/uL (ref 3.77–5.28)
RDW: 13.1 % (ref 11.7–15.4)
WBC: 4.5 10*3/uL (ref 3.4–10.8)

## 2020-08-22 LAB — LIPID PANEL
Chol/HDL Ratio: 2.6 ratio (ref 0.0–4.4)
Cholesterol, Total: 187 mg/dL (ref 100–199)
HDL: 72 mg/dL (ref >39)
LDL Chol Calc (NIH): 89 mg/dL (ref 0–99)
Triglycerides: 150 mg/dL — ABNORMAL HIGH (ref 0–149)
VLDL Cholesterol Cal: 26 mg/dL (ref 5–40)

## 2020-08-23 LAB — HGB A1C W/O EAG: Hgb A1c MFr Bld: 5.7 % — ABNORMAL HIGH (ref 4.8–5.6)

## 2020-08-23 LAB — SPECIMEN STATUS REPORT

## 2020-08-31 ENCOUNTER — Telehealth: Payer: Self-pay | Admitting: Family Medicine

## 2020-08-31 NOTE — Telephone Encounter (Signed)
Records received from [facility].

## 2020-09-04 DIAGNOSIS — H04123 Dry eye syndrome of bilateral lacrimal glands: Secondary | ICD-10-CM | POA: Diagnosis not present

## 2020-09-04 DIAGNOSIS — H1013 Acute atopic conjunctivitis, bilateral: Secondary | ICD-10-CM | POA: Diagnosis not present

## 2020-09-04 DIAGNOSIS — H40053 Ocular hypertension, bilateral: Secondary | ICD-10-CM | POA: Diagnosis not present

## 2020-09-04 DIAGNOSIS — H2513 Age-related nuclear cataract, bilateral: Secondary | ICD-10-CM | POA: Diagnosis not present

## 2020-09-04 DIAGNOSIS — G510 Bell's palsy: Secondary | ICD-10-CM | POA: Diagnosis not present

## 2020-09-04 DIAGNOSIS — H04221 Epiphora due to insufficient drainage, right lacrimal gland: Secondary | ICD-10-CM | POA: Diagnosis not present

## 2020-09-04 DIAGNOSIS — H40033 Anatomical narrow angle, bilateral: Secondary | ICD-10-CM | POA: Diagnosis not present

## 2020-09-05 ENCOUNTER — Encounter: Payer: Self-pay | Admitting: Family Medicine

## 2020-09-18 ENCOUNTER — Telehealth: Payer: Self-pay

## 2020-09-18 DIAGNOSIS — Z1239 Encounter for other screening for malignant neoplasm of breast: Secondary | ICD-10-CM

## 2020-09-18 NOTE — Telephone Encounter (Signed)
Called pt. LM for call back to schedule lab visit.

## 2020-09-18 NOTE — Telephone Encounter (Signed)
Let her know that I put in the future order for the blood draw.  Schedule the blood draw.

## 2020-09-18 NOTE — Telephone Encounter (Signed)
Pt. Called stating you asked her a while back if she wanted to get a BRCA breast cancer test, she said she wanted to do that now due to her family history if you guys could set that up for her. She can be reached at [phone].

## 2020-09-24 DIAGNOSIS — H40053 Ocular hypertension, bilateral: Secondary | ICD-10-CM | POA: Diagnosis not present

## 2020-09-24 DIAGNOSIS — H209 Unspecified iridocyclitis: Secondary | ICD-10-CM | POA: Diagnosis not present

## 2020-09-24 DIAGNOSIS — H40033 Anatomical narrow angle, bilateral: Secondary | ICD-10-CM | POA: Diagnosis not present

## 2020-09-24 DIAGNOSIS — H04221 Epiphora due to insufficient drainage, right lacrimal gland: Secondary | ICD-10-CM | POA: Diagnosis not present

## 2020-09-24 DIAGNOSIS — H04123 Dry eye syndrome of bilateral lacrimal glands: Secondary | ICD-10-CM | POA: Diagnosis not present

## 2020-09-24 DIAGNOSIS — H2513 Age-related nuclear cataract, bilateral: Secondary | ICD-10-CM | POA: Diagnosis not present

## 2020-09-25 ENCOUNTER — Other Ambulatory Visit: Payer: Medicare Other

## 2020-09-25 ENCOUNTER — Other Ambulatory Visit: Payer: Self-pay

## 2020-09-25 DIAGNOSIS — Z1239 Encounter for other screening for malignant neoplasm of breast: Secondary | ICD-10-CM

## 2020-09-26 ENCOUNTER — Telehealth: Payer: Self-pay | Admitting: Genetic Counselor

## 2020-09-26 ENCOUNTER — Encounter: Payer: Self-pay | Admitting: Genetic Counselor

## 2020-09-26 NOTE — Telephone Encounter (Signed)
Received call from Jodi Duffy about wanting testing for breast cancer genes due to family history of breast cancer in mother in her 16s and maternal aunt before the age of 55.  She is scheduled to see [name] on 11/30 at 11am.

## 2020-10-16 ENCOUNTER — Inpatient Hospital Stay: Payer: Medicare Other | Attending: Genetic Counselor | Admitting: Genetic Counselor

## 2020-10-16 ENCOUNTER — Encounter: Payer: Self-pay | Admitting: Genetic Counselor

## 2020-10-16 ENCOUNTER — Other Ambulatory Visit: Payer: Self-pay

## 2020-10-16 ENCOUNTER — Inpatient Hospital Stay: Payer: Medicare Other

## 2020-10-16 DIAGNOSIS — Z800 Family history of malignant neoplasm of digestive organs: Secondary | ICD-10-CM

## 2020-10-16 DIAGNOSIS — Z801 Family history of malignant neoplasm of trachea, bronchus and lung: Secondary | ICD-10-CM | POA: Insufficient documentation

## 2020-10-16 DIAGNOSIS — Z8049 Family history of malignant neoplasm of other genital organs: Secondary | ICD-10-CM | POA: Insufficient documentation

## 2020-10-16 DIAGNOSIS — Z803 Family history of malignant neoplasm of breast: Secondary | ICD-10-CM

## 2020-10-16 NOTE — Progress Notes (Signed)
REFERRING PROVIDER: Denita Lung, [address] [place],  [place] [address]  PRIMARY PROVIDER:  Denita Lung, MD  PRIMARY REASON FOR VISIT:  1. Family history of breast cancer in mother   2. Family history of throat cancer   3. Family history of lung cancer   4. Family history of cancer of female genital organ       HISTORY OF PRESENT ILLNESS:   Jodi Duffy, a 69 y.o. female, was seen for a [place] cancer genetics consultation due to a family history of cancer.  Jodi Duffy presents to clinic today to discuss the possibility of a hereditary predisposition to cancer, genetic testing, and to further clarify her future cancer risks, as well as potential cancer risks for family members.   Jodi Duffy does not have a personal history of cancer.    RISK FACTORS:  Menarche was at age 53.  First live birth at age 48.  OCP use for approximately 8 years.  Ovaries intact: yes.  Hysterectomy: no.  Menopausal status: postmenopausal.  HRT use: 0 years. Colonoscopy: yes; 2011, normal cologuard since. Mammogram within the last year: yes. Number of breast biopsies: 1. Any excessive radiation exposure in the past: no   Past Medical History:  Diagnosis Date  . Allergy    RHINITIS  . Arthritis   . Depression   . Family history of cancer of female genital organ   . Family history of lung cancer   . Family history of throat cancer   . Glaucoma   . Hyperlipidemia   . Hypertension   . Osteopenia   . TMJ syndrome     Past Surgical History:  Procedure Laterality Date  . BIOPSY BREAST    . BLEPHAROPLASTY    . KNEE ARTHROSCOPY     right knee, 1990 and 2012, Dr. Durward Fortes  . TEMPOROMANDIBULAR JOINT SURGERY    . TONSILLECTOMY    . TOTAL KNEE ARTHROPLASTY  08/10/2012   Procedure: TOTAL KNEE ARTHROPLASTY;  Surgeon: Garald Balding, MD;  Location: [facility];  Service: Orthopedics;  Laterality: Right;  Right Total Knee Arthroplasty  . TOTAL KNEE ARTHROPLASTY Left  06/14/2019   Procedure: LEFT TOTAL KNEE ARTHROPLASTY;  Surgeon: Garald Balding, MD;  Location: [facility];  Service: Orthopedics;  Laterality: Left;    Social History   Socioeconomic History  . Marital status: Married    Spouse name: Not on Duffy  . Number of children: Not on Duffy  . Years of education: Not on Duffy  . Highest education level: Professional school degree (e.g., MD, DDS, DVM, JD)  Occupational History  . Occupation: attorney    Employer: [employer]  Tobacco Use  . Smoking status: Former Smoker    Quit date: 02/10/1980    Years since quitting: 40.7  . Smokeless tobacco: Never Used  Vaping Use  . Vaping Use: Never used  Substance and Sexual Activity  . Alcohol use: Yes    Alcohol/week: 1.0 standard drink    Types: 1 Glasses of wine per week    Comment: daily  . Drug use: No  . Sexual activity: Yes  Other Topics Concern  . Not on Duffy  Social History Narrative   Exercises with pilates, golf, and has a Clinical research associate   Social Determinants of Health   Financial Resource Strain:   . Difficulty of Paying Living Expenses: Not on Duffy  Food Insecurity:   . Worried About Charity fundraiser in the Last Year:  Not on Duffy  . Ran Out of Food in the Last Year: Not on Duffy  Transportation Needs:   . Lack of Transportation (Medical): Not on Duffy  . Lack of Transportation (Non-Medical): Not on Duffy  Physical Activity:   . Days of Exercise per Week: Not on Duffy  . Minutes of Exercise per Session: Not on Duffy  Stress:   . Feeling of Stress : Not on Duffy  Social Connections:   . Frequency of Communication with Friends and Family: Not on Duffy  . Frequency of Social Gatherings with Friends and Family: Not on Duffy  . Attends Religious Services: Not on Duffy  . Active Member of Clubs or Organizations: Not on Duffy  . Attends Archivist Meetings: Not on Duffy  . Marital Status: Not on Duffy     FAMILY HISTORY:  We obtained a detailed, 4-generation family  history.  Significant diagnoses are listed below: Family History  Problem Relation Age of Onset  . Heart disease Mother        valve replacement  . Breast cancer Mother 8  . Throat cancer Mother 34       smoker  . Heart disease Father        died of brain embolism after hip surgery  . Arthritis Father   . Arthritis Brother   . Breast cancer Maternal Aunt        dx in her 44s  . Breast cancer Other        dx in her 70s, maternal first cousin's daughter  . Lung cancer Maternal Aunt        dx in her 50s/60s, non-smoker  . Vaginal cancer Maternal Aunt        dx in her 11s  . Diabetes Neg Hx   . Hypertension Neg Hx   . Stroke Neg Hx    Jodi Duffy has one son (age 87) and one daughter (age 67). She has one brother (age 30). None of these family members have had cancer.  Jodi Duffy's mother died at the age of 21 and had a history of breast cancer (diagnosed around age 41) and throat cancer (diagnosed around age 38). Jodi Duffy had three maternal aunts. One aunt died in her 1s or 34s from lung cancer and was not a smoker. Another aunt died from a vaginal cancer diagnosed in her 63s. The third aunt died from breast cancer in her 44s. One maternal first cousin's daughter was diagnosed with breast cancer in her 35s. Jodi Duffy's maternal grandmother died in her 31s and her maternal grandfather died in his 17s.  Jodi Duffy's father died at the age of 85 and did not have cancer. He did not have any siblings. Her paternal grandparents died older than 73 and did not have cancer.   Jodi Duffy is unaware of previous family history of genetic testing for hereditary cancer risks. Patient's ancestors are of [place], [place], and [place] descent. There is no reported Ashkenazi Jewish ancestry. There is no known consanguinity.  GENETIC COUNSELING ASSESSMENT: Jodi Duffy is a 69 y.o. female with a family history of breast cancer which is somewhat suggestive of a hereditary cancer syndrome and  predisposition to cancer. We, therefore, discussed and recommended the following at today's visit.   DISCUSSION: We discussed that approximately 5-10% of breast cancer is hereditary, with most cases associated with the BRCA1 and BRCA2 genes. There are other genes that can be associated with hereditary breast cancer syndromes. These include ATM,  CHEK2, PALB2, etc. We discussed that testing is beneficial for several reasons, including knowing about other cancer risks, identifying potential screening and risk-reduction options that may be appropriate, and to understand if other family members could be at risk for cancer and allow them to undergo genetic testing.   We reviewed the characteristics, features and inheritance patterns of hereditary cancer syndromes. We also discussed genetic testing, including the appropriate family members to test, the process of testing, insurance coverage and turn-around-time for results. We discussed the implications of a negative, positive and/or variant of uncertain significant result. We recommended Jodi Duffy pursue genetic testing for the [facility] Common Hereditary Cancers + RNA panel.   The Common Hereditary Cancers Panel offered by [facility] includes sequencing and/or deletion duplication testing of the following 47genes: APC*, ATM*, AXIN2*, BARD1*, BMPR1A*, BRCA1*, BRCA2*, BRIP1*, CDH1*, CDK4, CDKN2A (p14ARF), CDKN2A (p16INK4a), CHEK2*, CTNNA1*, DICER1*, EPCAM (Deletion/duplication testing only), GREM1 (promoter region deletion/duplication testing only), KIT, MEN1*, MLH1*, MSH2*, MSH3*, MSH6*, MUTYH*, NBN*, NF1*, NTHL1, PALB2*, PDGFRA, PMS2*, POLD1*, POLE*, PTEN*, RAD50*, RAD51C*, RAD51D*, SDHB*, SDHC*, SDHD*, SMAD4*, SMARCA4*, STK11*, TP53*, TSC1*, TSC2*, and VHL*.  The following genes were evaluated for sequence changes only: SDHA* and HOXB13 c.251G>A variant only. RNA analysis performed for * genes.   Based on Jodi Duffy's family history of cancer, she meets medical  criteria for genetic testing. Despite that she meets criteria, there may still be an out of pocket cost. We discussed that if her out of pocket cost for testing is over $100, the laboratory will reach out to let her know. If the out of pocket cost of testing is less than $100 she will be billed by the genetic testing laboratory.   Lastly, we discussed that some people do not want to undergo genetic testing due to fear of genetic discrimination.  A federal law called the Genetic Information Non-Discrimination Act (GINA) of 2008 helps protect individuals against genetic discrimination based on their genetic test results.  It impacts both health insurance and employment.  With health insurance, it protects against increased premiums, being kicked off insurance or being forced to take a test in order to be insured.  For employment it protects against hiring, firing and promoting decisions based on genetic test results.  Health status due to a cancer diagnosis is not protected under GINA.  Additionally, life, disability, and long-term care insurance is not protected under GINA.   PLAN: After considering the risks, benefits, and limitations, Jodi Duffy provided informed consent to pursue genetic testing and the blood sample was sent to [facility] for analysis of the Common Hereditary Cancers + RNA panel. Results should be available within approximately two-three weeks' time, at which point they will be disclosed by telephone to Jodi Duffy, as will any additional recommendations warranted by these results. Jodi Duffy will receive a summary of her genetic counseling visit and a copy of her results once available. This information will also be available in Epic.   Jodi Duffy's questions were answered to her satisfaction today. Our contact information was provided should additional questions or concerns arise. Thank you for the referral and allowing [place] to share in the care of your patient.   Clint Guy, [place], [facility] Licensed, Certified Dispensing [email or web address] Phone: [phone]  The patient was seen for a total of 25 minutes in face-to-face genetic counseling.  This patient was discussed with Drs. Magrinat, Lindi Adie and/or Burr Medico who agrees with the above.    _______________________________________________________________________ For Office Staff:  Number of people involved  in session: 1 Was an Intern/ student involved with case: no

## 2020-10-23 ENCOUNTER — Other Ambulatory Visit: Payer: Self-pay | Admitting: Family Medicine

## 2020-10-23 DIAGNOSIS — Z79899 Other long term (current) drug therapy: Secondary | ICD-10-CM | POA: Diagnosis not present

## 2020-10-23 DIAGNOSIS — H43821 Vitreomacular adhesion, right eye: Secondary | ICD-10-CM | POA: Diagnosis not present

## 2020-10-23 DIAGNOSIS — H2513 Age-related nuclear cataract, bilateral: Secondary | ICD-10-CM | POA: Diagnosis not present

## 2020-10-23 DIAGNOSIS — H44113 Panuveitis, bilateral: Secondary | ICD-10-CM | POA: Diagnosis not present

## 2020-10-23 DIAGNOSIS — I10 Essential (primary) hypertension: Secondary | ICD-10-CM

## 2020-10-23 DIAGNOSIS — H30033 Focal chorioretinal inflammation, peripheral, bilateral: Secondary | ICD-10-CM | POA: Diagnosis not present

## 2020-10-31 DIAGNOSIS — H04221 Epiphora due to insufficient drainage, right lacrimal gland: Secondary | ICD-10-CM | POA: Diagnosis not present

## 2020-10-31 DIAGNOSIS — H40033 Anatomical narrow angle, bilateral: Secondary | ICD-10-CM | POA: Diagnosis not present

## 2020-10-31 DIAGNOSIS — H40053 Ocular hypertension, bilateral: Secondary | ICD-10-CM | POA: Diagnosis not present

## 2020-10-31 DIAGNOSIS — H1013 Acute atopic conjunctivitis, bilateral: Secondary | ICD-10-CM | POA: Diagnosis not present

## 2020-10-31 DIAGNOSIS — H209 Unspecified iridocyclitis: Secondary | ICD-10-CM | POA: Diagnosis not present

## 2020-10-31 DIAGNOSIS — G510 Bell's palsy: Secondary | ICD-10-CM | POA: Diagnosis not present

## 2020-10-31 DIAGNOSIS — H2513 Age-related nuclear cataract, bilateral: Secondary | ICD-10-CM | POA: Diagnosis not present

## 2020-10-31 DIAGNOSIS — H04123 Dry eye syndrome of bilateral lacrimal glands: Secondary | ICD-10-CM | POA: Diagnosis not present

## 2020-11-06 DIAGNOSIS — H30033 Focal chorioretinal inflammation, peripheral, bilateral: Secondary | ICD-10-CM | POA: Diagnosis not present

## 2020-11-06 DIAGNOSIS — Z1379 Encounter for other screening for genetic and chromosomal anomalies: Secondary | ICD-10-CM | POA: Insufficient documentation

## 2020-11-06 DIAGNOSIS — Z79899 Other long term (current) drug therapy: Secondary | ICD-10-CM | POA: Diagnosis not present

## 2020-11-06 DIAGNOSIS — H44113 Panuveitis, bilateral: Secondary | ICD-10-CM | POA: Diagnosis not present

## 2020-11-06 DIAGNOSIS — H43821 Vitreomacular adhesion, right eye: Secondary | ICD-10-CM | POA: Diagnosis not present

## 2020-11-06 DIAGNOSIS — H2513 Age-related nuclear cataract, bilateral: Secondary | ICD-10-CM | POA: Diagnosis not present

## 2020-11-07 ENCOUNTER — Telehealth (INDEPENDENT_AMBULATORY_CARE_PROVIDER_SITE_OTHER): Payer: Medicare Other | Admitting: Family Medicine

## 2020-11-07 ENCOUNTER — Other Ambulatory Visit: Payer: Medicare Other

## 2020-11-07 ENCOUNTER — Other Ambulatory Visit: Payer: Self-pay

## 2020-11-07 VITALS — Temp 98.6°F | Wt 145.0 lb

## 2020-11-07 DIAGNOSIS — R059 Cough, unspecified: Secondary | ICD-10-CM | POA: Diagnosis not present

## 2020-11-07 MED ORDER — BENZONATATE 100 MG PO CAPS: 200.0000 mg | ORAL_CAPSULE | Freq: Two times a day (BID) | ORAL | 0 refills | Status: DC | PRN

## 2020-11-07 NOTE — Progress Notes (Signed)
   Subjective:    Patient ID: Jodi Duffy, female    DOB: 1951/11/01, 69 y.o.   MRN: [id number]  HPI I connected with  Jodi Duffy on 11/07/20 by a video enabled telemedicine application and verified that I am speaking with the correct person using two identifiers.  Caregility used.  Me: Office.  Patient: Home. I discussed the limitations of evaluation and management by telemedicine. The patient expressed understanding and agreed to proceed. She developed a cough on December 17 but no fever, chills, shortness of breath, smell or taste changes, sore throat or earache.  She has had the booster shot.  She has not been tested during this episode.     Review of Systems     Objective:   Physical Exam Alert and in no distress otherwise not examined.  No tachypnea noted.       Assessment & Plan:  Cough - Plan: POC COVID-19, Novel Coronavirus, NAA ([facility]), benzonatate (TESSALON) 100 MG capsule She will be tested for Covid. Tessalon Perles called in.  Recommend supportive care.  Follow-up pending results of Covid testing.

## 2020-11-08 LAB — NOVEL CORONAVIRUS, NAA: SARS-CoV-2, NAA: NOT DETECTED

## 2020-11-08 LAB — SARS-COV-2, NAA 2 DAY TAT

## 2020-11-12 ENCOUNTER — Telehealth: Payer: Self-pay | Admitting: Genetic Counselor

## 2020-11-12 NOTE — Telephone Encounter (Signed)
LVM that her genetic test results are available and requested that she call back to discuss them.  

## 2020-11-19 ENCOUNTER — Encounter: Payer: Self-pay | Admitting: Genetic Counselor

## 2020-11-19 ENCOUNTER — Ambulatory Visit: Payer: Self-pay | Admitting: Genetic Counselor

## 2020-11-19 DIAGNOSIS — Z1379 Encounter for other screening for genetic and chromosomal anomalies: Secondary | ICD-10-CM

## 2020-11-19 NOTE — Progress Notes (Signed)
HPI:  Jodi Duffy was previously seen in the [facility] Duffy to a family history of cancer and concerns regarding a hereditary predisposition to cancer. Please refer to our prior cancer genetics clinic note for more information regarding our discussion, assessment and recommendations, at the time. Jodi Duffy's recent genetic test results were disclosed to her, as were recommendations warranted by these results. These results and recommendations are discussed in more detail below.  FAMILY HISTORY:  We obtained a detailed, 4-generation family history.  Significant diagnoses are listed below: Family History  Problem Relation Age of Onset  . Heart disease Mother        valve replacement  . Breast cancer Mother 32  . Throat cancer Mother 40       smoker  . Heart disease Father        died of brain embolism after hip surgery  . Arthritis Father   . Arthritis Brother   . Breast cancer Maternal Aunt        dx in her 23s  . Breast cancer Other        dx in her 39s, maternal first cousin's daughter  . Lung cancer Maternal Aunt        dx in her 50s/60s, non-smoker  . Vaginal cancer Maternal Aunt        dx in her 50s  . Diabetes Neg Hx   . Hypertension Neg Hx   . Stroke Neg Hx    Jodi Duffy has one son (age 80) and one daughter (age 1). She has one brother (age 37). None of these family members have had cancer.  Jodi Duffy's mother died at the age of 71 and had a history of breast cancer (diagnosed around age 31) and throat cancer (diagnosed around age 61). Jodi Duffy had three maternal aunts. One aunt died in her 29s or 52s from lung cancer and was not a smoker. Another aunt died from a vaginal cancer diagnosed in her 82s. The third aunt died from breast cancer in her 66s. One maternal first cousin's daughter was diagnosed with breast cancer in her 49s. Jodi Duffy's maternal grandmother died in her 47s and her maternal grandfather died in his 19s.  [name]'s father died at the age of 76 and did not have cancer. He did not have any siblings. Her paternal grandparents died older than 57 and did not have cancer.   Jodi Duffy is unaware of previous family history of genetic testing for hereditary cancer risks. Patient's ancestors are of [place], [place], and [place] descent. There is no reported Ashkenazi Jewish ancestry. There is no known consanguinity.  GENETIC TEST RESULTS: Genetic testing reported out on 11/06/2020 through the [facility] Common Hereditary Cancers + RNA panel. No pathogenic variants were detected.   The Common Hereditary Cancers Panel offered by [facility] includes sequencing and/or deletion duplication testing of the following 47genes: APC*, ATM*, AXIN2*, BARD1*, BMPR1A*, BRCA1*, BRCA2*, BRIP1*, CDH1*, CDK4, CDKN2A (p14ARF), CDKN2A (p16INK4a), CHEK2*, CTNNA1*, DICER1*, EPCAM (Deletion/duplication testing only), GREM1 (promoter region deletion/duplication testing only), KIT, MEN1*, MLH1*, MSH2*, MSH3*, MSH6*, MUTYH*, NBN*, NF1*, NTHL1, PALB2*, PDGFRA, PMS2*, POLD1*, POLE*, PTEN*, RAD50*, RAD51C*, RAD51D*, SDHB*, SDHC*, SDHD*, SMAD4*, SMARCA4*, STK11*, TP53*, TSC1*, TSC2*, and VHL*.  The following genes were evaluated for sequence changes only: SDHA* and HOXB13 c.251G>A variant only. RNA analysis performed for * genes. The test report will be scanned into EPIC and located under the Molecular Pathology section of the Results Review tab.  A portion  of the result report is included below for reference.     We discussed with Jodi Duffy that because current genetic testing is not perfect, it is possible there may be a gene mutation in one of these genes that current testing cannot detect, but that chance is small.  We also discussed that there could be another gene that has not yet been discovered, or that we have not yet tested, that is responsible for the cancer diagnoses in the family. It is also possible there is a hereditary cause for the  cancer in the family that Jodi Duffy did not inherit and therefore was not identified in her testing.  Therefore, it is important to remain in touch with cancer genetics in the future so that we can continue to offer Jodi Duffy the most up to date genetic testing.   CANCER SCREENING RECOMMENDATIONS: Jodi Duffy's test result is considered negative (normal).  This means that we have not identified a hereditary cause for her family history of cancer at this time. While reassuring, this does not definitively rule out a hereditary predisposition to cancer. It is still possible that there could be genetic mutations that are undetectable by current technology. There could be genetic mutations in genes that have not been tested or identified to increase cancer risk.  Therefore, it is recommended she continue to follow the cancer management and screening guidelines provided by her primary healthcare provider.   An individual's cancer risk and medical management are not determined by genetic test results alone. Overall cancer risk assessment incorporates additional factors, including personal medical history, family history, and any available genetic information that may result in a personalized plan for cancer prevention and surveillance.  Based on Jodi Duffy's personal and family history, as well as her genetic test results, a statistical model Midwife) was used to estimate her risk of developing breast cancer. Tyrer-Cuzick estimates her lifetime risk of developing breast cancer to be approximately 18.3%. This lifetime breast cancer risk is a preliminary estimate based on available information using one of several models endorsed by the [employer] (ACS). The ACS recommends consideration of breast MRI screening as an adjunct to mammography for patients at high risk (defined as 20% or greater lifetime risk). A more detailed breast cancer risk assessment can be considered, if clinically  indicated.      RECOMMENDATIONS FOR FAMILY MEMBERS:  Individuals in this family might be at some increased risk of developing cancer, over the general population risk, simply Duffy to the family history of cancer.  We recommended women in this family have a yearly mammogram beginning at age 28, or 84 years younger than the earliest onset of cancer, an annual clinical breast exam, and perform monthly breast self-exams. Women in this family should also have a gynecological exam as recommended by their primary provider. All family members should be referred for colonoscopy starting at age 35.  It is also possible there is a hereditary cause for the cancer in Jodi Duffy's family that she did not inherit and therefore was not identified in her.  Based on Jodi Duffy's family history, we recommended her maternal cousin's daughter, who was diagnosed with breast cancer in her 23s, have genetic counseling and testing. Jodi Duffy will let us know if we can be of any assistance in coordinating genetic counseling and/or testing for this family member.   FOLLOW-UP: Lastly, we discussed with Jodi Duffy that cancer genetics is a rapidly advancing field and it is possible that new  genetic tests will be appropriate for her and/or her family members in the future. We encouraged her to remain in contact with cancer genetics on an annual basis so we can update her personal and family histories and let her know of advances in cancer genetics that may benefit this family.   Our contact number was provided. Jodi Duffy's questions were answered to her satisfaction, and she knows she is welcome to call us at anytime with additional questions or concerns.   Clint Guy, MS, [facility] Genetic Counselor [email or web address] Phone: [phone]

## 2020-11-19 NOTE — Telephone Encounter (Signed)
Revealed negative genetic testing. Discussed that we do not knowmwhy there is cancer in the family. There could be a genetic mutation in the family that Jodi Duffy did not inherit. There could also be a mutation in a different gene that we are not testing, or our current technology may not be able detect certain mutations. It will therefore be important for her to stay in contact with genetics to keep up with whether additional testing may be appropriate in the future.

## 2020-11-28 DIAGNOSIS — H40033 Anatomical narrow angle, bilateral: Secondary | ICD-10-CM | POA: Diagnosis not present

## 2020-11-28 DIAGNOSIS — H209 Unspecified iridocyclitis: Secondary | ICD-10-CM | POA: Diagnosis not present

## 2020-11-28 DIAGNOSIS — H40053 Ocular hypertension, bilateral: Secondary | ICD-10-CM | POA: Diagnosis not present

## 2020-12-10 DIAGNOSIS — H2513 Age-related nuclear cataract, bilateral: Secondary | ICD-10-CM | POA: Diagnosis not present

## 2020-12-10 DIAGNOSIS — H00024 Hordeolum internum left upper eyelid: Secondary | ICD-10-CM | POA: Diagnosis not present

## 2020-12-18 DIAGNOSIS — H2513 Age-related nuclear cataract, bilateral: Secondary | ICD-10-CM | POA: Diagnosis not present

## 2020-12-18 DIAGNOSIS — H43821 Vitreomacular adhesion, right eye: Secondary | ICD-10-CM | POA: Diagnosis not present

## 2020-12-18 DIAGNOSIS — H30033 Focal chorioretinal inflammation, peripheral, bilateral: Secondary | ICD-10-CM | POA: Diagnosis not present

## 2020-12-18 DIAGNOSIS — H44113 Panuveitis, bilateral: Secondary | ICD-10-CM | POA: Diagnosis not present

## 2020-12-18 DIAGNOSIS — Z79899 Other long term (current) drug therapy: Secondary | ICD-10-CM | POA: Diagnosis not present

## 2020-12-20 DIAGNOSIS — Z79899 Other long term (current) drug therapy: Secondary | ICD-10-CM | POA: Diagnosis not present

## 2020-12-20 DIAGNOSIS — H04203 Unspecified epiphora, bilateral lacrimal glands: Secondary | ICD-10-CM | POA: Diagnosis not present

## 2021-01-07 ENCOUNTER — Telehealth (INDEPENDENT_AMBULATORY_CARE_PROVIDER_SITE_OTHER): Payer: Medicare Other | Admitting: Family Medicine

## 2021-01-07 ENCOUNTER — Other Ambulatory Visit: Payer: Self-pay

## 2021-01-07 ENCOUNTER — Encounter: Payer: Self-pay | Admitting: Family Medicine

## 2021-01-07 ENCOUNTER — Other Ambulatory Visit (INDEPENDENT_AMBULATORY_CARE_PROVIDER_SITE_OTHER): Payer: Medicare Other

## 2021-01-07 VITALS — Temp 102.0°F | Wt 140.0 lb

## 2021-01-07 DIAGNOSIS — R509 Fever, unspecified: Secondary | ICD-10-CM

## 2021-01-07 LAB — POC COVID19 BINAXNOW: SARS Coronavirus 2 Ag: NEGATIVE

## 2021-01-07 NOTE — Progress Notes (Addendum)
   Subjective:    Patient ID: Jodi Duffy, female    DOB: 1951/03/09, 70 y.o.   MRN: [id number]  HPI  Documentation for virtual audio and video telecommunications through [place] encounter: The patient was located at home. 2 patient identifiers used.  The provider was located in the office. The patient did consent to this visit and is aware of possible charges through their insurance for this visit. The other persons participating in this telemedicine service were none. Time spent on call was 10 minutes and in review of previous records >20 minutes total for counseling and coordination of care. This virtual service is not related to other E/M service within previous 7 days. She has a 4-day history of started with fever, chills, slight cough and abdominal cramping but no nausea, vomiting, smell or taste change, sore throat, earache, shortness of breath.  She has had Pfizer vaccine plus booster.  She has been taking OTC fever medications of ibuprofen.    Review of Systems     Objective:   Physical Exam Alert and noted to have a gravelly voice and sounding weak.       Assessment & Plan:  Fever and chills - Plan: Novel Coronavirus, NAA ([facility]), POC COVID-19 She is to use 2 Tylenol 4 times per day alternating with 3 ibuprofen 4 times a day to keep her fever and chills are under control. I informed her that the rapid test was negative and we will wait for the PCR.

## 2021-01-08 ENCOUNTER — Telehealth: Payer: Self-pay | Admitting: Family Medicine

## 2021-01-08 LAB — SARS-COV-2, NAA 2 DAY TAT

## 2021-01-08 LAB — NOVEL CORONAVIRUS, NAA: SARS-CoV-2, NAA: NOT DETECTED

## 2021-01-08 NOTE — Telephone Encounter (Signed)
Pt called and states that she seen that her covid test is negative, states she is still feeling bad having low grade fevers and feeling weak, and having the really bad stomach pain, she wants to know what needs to be done next. Pt can be reached at [phone] and pt uses [place], [place]

## 2021-01-11 ENCOUNTER — Telehealth: Payer: Self-pay

## 2021-01-11 NOTE — Telephone Encounter (Signed)
Continue to treat her symptoms but if she is frequently better by Monday set up a office visit

## 2021-01-11 NOTE — Telephone Encounter (Signed)
Pt was advised [facility] 

## 2021-01-11 NOTE — Telephone Encounter (Signed)
Called and advised that she is still sick and it is day number 9 of the flu. She would like to know if there is anything else she can do to feel better other than aleve and tylenol. [place]

## 2021-01-14 ENCOUNTER — Other Ambulatory Visit: Payer: Self-pay | Admitting: *Deleted

## 2021-01-14 ENCOUNTER — Other Ambulatory Visit: Payer: Self-pay | Admitting: Family Medicine

## 2021-01-14 ENCOUNTER — Encounter: Payer: Self-pay | Admitting: *Deleted

## 2021-01-14 ENCOUNTER — Other Ambulatory Visit: Payer: Self-pay

## 2021-01-14 ENCOUNTER — Ambulatory Visit (HOSPITAL_BASED_OUTPATIENT_CLINIC_OR_DEPARTMENT_OTHER)
Admission: RE | Admit: 2021-01-14 | Discharge: 2021-01-14 | Disposition: A | Discharge status: Home / Self Care | Payer: Medicare Other | Source: Ambulatory Visit | Attending: Family Medicine | Admitting: Family Medicine

## 2021-01-14 ENCOUNTER — Ambulatory Visit (INDEPENDENT_AMBULATORY_CARE_PROVIDER_SITE_OTHER): Payer: Medicare Other | Admitting: Family Medicine

## 2021-01-14 ENCOUNTER — Encounter: Payer: Self-pay | Admitting: Family Medicine

## 2021-01-14 VITALS — BP 160/80 | HR 64 | Temp 98.3°F | Ht 63.0 in | Wt 142.6 lb

## 2021-01-14 DIAGNOSIS — R748 Abnormal levels of other serum enzymes: Secondary | ICD-10-CM | POA: Insufficient documentation

## 2021-01-14 DIAGNOSIS — R7989 Other specified abnormal findings of blood chemistry: Secondary | ICD-10-CM

## 2021-01-14 DIAGNOSIS — K219 Gastro-esophageal reflux disease without esophagitis: Secondary | ICD-10-CM

## 2021-01-14 DIAGNOSIS — R1084 Generalized abdominal pain: Secondary | ICD-10-CM

## 2021-01-14 DIAGNOSIS — R5383 Other fatigue: Secondary | ICD-10-CM

## 2021-01-14 DIAGNOSIS — D72829 Elevated white blood cell count, unspecified: Secondary | ICD-10-CM | POA: Insufficient documentation

## 2021-01-14 DIAGNOSIS — R109 Unspecified abdominal pain: Secondary | ICD-10-CM | POA: Diagnosis not present

## 2021-01-14 DIAGNOSIS — R1013 Epigastric pain: Secondary | ICD-10-CM

## 2021-01-14 LAB — COMPREHENSIVE METABOLIC PANEL
ALT: 89 IU/L — ABNORMAL HIGH (ref 0–32)
AST: 78 IU/L — ABNORMAL HIGH (ref 0–40)
Albumin/Globulin Ratio: 1 — ABNORMAL LOW (ref 1.2–2.2)
Albumin: 2.7 g/dL — ABNORMAL LOW (ref 3.7–4.7)
Alkaline Phosphatase: 404 IU/L — ABNORMAL HIGH (ref 44–121)
BUN/Creatinine Ratio: 46 — ABNORMAL HIGH (ref 12–28)
BUN: 51 mg/dL — ABNORMAL HIGH (ref 8–27)
Bilirubin Total: 0.4 mg/dL (ref 0.0–1.2)
CO2: 26 mmol/L (ref 20–29)
Calcium: 9.4 mg/dL (ref 8.7–10.3)
Chloride: 98 mmol/L (ref 96–106)
Creatinine, Ser: 1.11 mg/dL — ABNORMAL HIGH (ref 0.57–1.00)
Globulin, Total: 2.8 g/dL (ref 1.5–4.5)
Glucose: 104 mg/dL — ABNORMAL HIGH (ref 65–99)
Potassium: 4.7 mmol/L (ref 3.5–5.2)
Sodium: 134 mmol/L (ref 134–144)
Total Protein: 5.5 g/dL — ABNORMAL LOW (ref 6.0–8.5)
eGFR: 53 mL/min/{1.73_m2} — ABNORMAL LOW (ref >59)

## 2021-01-14 LAB — POCT URINALYSIS DIP (PROADVANTAGE DEVICE)
Bilirubin, UA: NEGATIVE
Glucose, UA: NEGATIVE mg/dL
Ketones, POC UA: NEGATIVE mg/dL
Nitrite, UA: POSITIVE — AB
Protein Ur, POC: 30 mg/dL — AB
Specific Gravity, Urine: 1.015
Urobilinogen, Ur: NEGATIVE
pH, UA: 6 (ref 5.0–8.0)

## 2021-01-14 LAB — CBC WITH DIFFERENTIAL/PLATELET
Basophils Absolute: 0 10*3/uL (ref 0.0–0.2)
Basos: 0 %
EOS (ABSOLUTE): 0 10*3/uL (ref 0.0–0.4)
Eos: 0 %
Hematocrit: 29.5 % — ABNORMAL LOW (ref 34.0–46.6)
Hemoglobin: 10.5 g/dL — ABNORMAL LOW (ref 11.1–15.9)
Lymphocytes Absolute: 0.7 10*3/uL (ref 0.7–3.1)
Lymphs: 4 %
MCH: 30.2 pg (ref 26.6–33.0)
MCHC: 35.6 g/dL (ref 31.5–35.7)
MCV: 85 fL (ref 79–97)
Monocytes Absolute: 1 10*3/uL — ABNORMAL HIGH (ref 0.1–0.9)
Monocytes: 6 %
Neutrophils Absolute: 14.5 10*3/uL — ABNORMAL HIGH (ref 1.4–7.0)
Neutrophils: 90 %
Platelets: 579 10*3/uL — ABNORMAL HIGH (ref 150–450)
RBC: 3.48 x10E6/uL — ABNORMAL LOW (ref 3.77–5.28)
RDW: 14.2 % (ref 11.7–15.4)
WBC: 16.2 10*3/uL — ABNORMAL HIGH (ref 3.4–10.8)

## 2021-01-14 LAB — LIPASE: Lipase: 84 U/L (ref 14–85)

## 2021-01-14 MED ORDER — OMEPRAZOLE 40 MG PO CPDR: 40.0000 mg | DELAYED_RELEASE_CAPSULE | Freq: Every day | ORAL | 1 refills | Status: DC

## 2021-01-14 NOTE — Patient Instructions (Signed)
Be sure to stay well hydrated. Stop using Aleve (given abdominal pain). You may continue tylenol if needed for pain or fever, unless we tell you to stop based on lab results.  I recommend taking prilosec OTC or Nexium once daily.  This would treat any gastritis, reflux--given the upper abdominal discomfort, belching and use of Aleve, this might help. Take it with your evening meal (since symptoms seem to be the worst in the morning, as far as vomiting).  We will be in touch with lab results later today and to determine the next step (ie CT scan vs ER vs other).

## 2021-01-14 NOTE — Progress Notes (Addendum)
Chief Complaint  Patient presents with  . Abdominal Pain  . Follow-up    Saw Dr.L on 2/21 for fever and chills, that is gone now. She is now extremely fatigued and having abdominal pain that started about 7 days ago. The pain is upper abd and it goes around her back. Original symptoms started on 01/04/20. All covid tests were negative. Could not give UA when asked.     She saw Dr. Redmond School 01/07/21 with "4-day history fever, chills, slight cough and abdominal cramping but no nausea, vomiting, smell or taste change, sore throat, earache, shortness of breath."   COVID test was negative  Today she is accompanied by her husband, and is complaining of abdominal pain. Pain is constant in the upper stomach (across the whole upper area), sometimes spreads to the lower abdomen, on both sides.  Also having mid back (under shoulder blades). Denies constipation or diarrhea. Last BM this morning, normal, no straining, blood, mucus. Decreased appetite, only eating once a day. Vomited a couple of times, in the mornings, a few times, since this illness. Belching.  Pain started 2/17, gradually has gotten worse. Fevers subsided around 2/22. Does report to taking Aleve twice daily and Tylenol 4 times daily (so fevers could be masked by meds).  She is also complaining of severe fatigue--can hardly walk 5 feet. Husband also notes slight confusion (not normal per husband, seems a little off).   PMH, PSH, SH reviewed Notable for a lot of eye problems, sees many eye doctors.  Colonoscopy by Dr. Earlean Shawl 09/2018 (see addendum below--ERROR--only had negative Cologuard in 2019)  Last labs--08/2020, chem, cbc, lipids, A1c 5.7  12/20/20 [facility] labs chem, CBC, WBC 3.9   Outpatient Encounter Medications as of 01/14/2021  Medication Sig  . Adalimumab 40 MG/0.4ML PNKT Inject 43m SQ on Day 1, 424mon Day 8, then 4012mvery other week Quantity 4 Maintenance: Inject 54m62m every other week Quantity 2  . atorvastatin (LIPITOR)  10 MG tablet TAKE 1 TABLET(10 MG) BY MOUTH DAILY  . atropine 1 % ophthalmic solution 1 drop daily.  . cholecalciferol (VITAMIN D) 1000 units tablet Take 1,000 Units by mouth daily.  . COMBIGAN 0.2-0.5 % ophthalmic solution Place 1 drop into both eyes 2 (two) times a day.  . dorzolamide (TRUSOPT) 2 % ophthalmic solution Place 1 drop into both eyes 2 (two) times daily.  . LUMarland KitchenIGAN 0.01 % SOLN Place 1 drop into both eyes daily.  . Multiple Vitamins-Minerals (MULTIVITAMIN WITH MINERALS) tablet Take 1 tablet by mouth daily. Women's Multivitamin 50+  . mycophenolate (CELLCEPT) 500 MG tablet   . naproxen sodium (ALEVE) 220 MG tablet Take 440 mg by mouth every 8 (eight) hours.  . olMarland Kitchenesartan-hydrochlorothiazide (BENICAR HCT) 40-12.5 MG tablet TAKE 1 TABLET BY MOUTH DAILY  . venlafaxine XR (EFFEXOR-XR) 150 MG 24 hr capsule TAKE 1 CAPSULE(150 MG) BY MOUTH DAILY WITH BREAKFAST  . amoxicillin (AMOXIL) 500 MG tablet Take 4 tablets one hour prior to dental procedure. (Patient not taking: No sig reported)  . cetirizine-pseudoephedrine (ZYRTEC-D) 5-120 MG tablet Zyrtec-D 5 mg-120 mg tablet,extended release  TAKE 1 TABLET BY MOUTH EVERY DAY (Patient not taking: Reported on 01/14/2021)  . cyclopentolate (CYCLODRYL,CYCLOGYL) 1 % ophthalmic solution 1 drop 3 (three) times daily. (Patient not taking: No sig reported)  . [DISCONTINUED] benzonatate (TESSALON) 100 MG capsule Take 2 capsules (200 mg total) by mouth 2 (two) times daily as needed for cough. (Patient not taking: Reported on 01/07/2021)  . [DISCONTINUED] methocarbamol (ROBAXIN)  500 MG tablet Take 1 tablet (500 mg total) by mouth every 8 (eight) hours as needed for muscle spasms. (Patient not taking: No sig reported)  . [DISCONTINUED] nitrofurantoin, macrocrystal-monohydrate, (MACROBID) 100 MG capsule Take 1 capsule (100 mg total) by mouth 2 (two) times daily. (Patient not taking: No sig reported)  . [DISCONTINUED] prednisoLONE acetate (PRED FORTE) 1 % ophthalmic  suspension 1 drop 4 (four) times daily. (Patient not taking: No sig reported)  . [DISCONTINUED] sulfamethoxazole-trimethoprim (BACTRIM DS) 800-160 MG tablet Take 1 tablet by mouth 2 (two) times daily. (Patient not taking: No sig reported)   No facility-administered encounter medications on file as of 01/14/2021.   No Known Allergies  ROS: fever and chills per HPI (none noted since 2/22). +fatigue, decreased appetite and abdominal pain per HPI.  Some vomiting and belching, per HPI. No constipation, blood in stool. No urinary complaints. No vaginal bleeding or discharge No rashes, bruising. See HPI   PHYSICAL EXAM:  BP (!) 160/80   Pulse 64   Temp 98.3 F (36.8 C) (Tympanic)   Ht 5' 3"  (1.6 m)   Wt 142 lb 9.6 oz (64.7 kg)   BMI 25.26 kg/m   Tired appearing female, frequently laying down and resting.  She is just a little confused--mostly oriented, but occasionally mixing up tylenol/ibuprofen in sentence in trying to clear up history, just a little confused. HEENT: conjunctiva and sclera are clear, anicteric. Wearing mask Neck: no lymphadenopathy, thyromegaly or mass Heart: regular rate and rhythm Lungs: clear bilaterally Back: no spinal tenderness.  Mildly tender at R CVA Abdomen: mildly tender at epigastrium, also some focal tenderness at RUQ, LUQ, mild diffuse tenderness along L abdomen, and RLQ and LLQ. No guarding, no rebound.  Normal bowel sounds Extremities: no edema Psych: normal mood, hygiene, grooming Neuro: tired/somnolent (mild).  Strength is normal, nonfocal exam. Skin: normal turgor, no rash  ASSESSMENT/PLAN:  Abdominal pain, generalized - upper and lower abdominal pain, fairly diffuse. Ddx liver/GB dz, ulcer/gastritis/GERD, diverticulitis vs other. If abnl labs, get CT - Plan: CBC with Differential/Platelet, Comprehensive metabolic panel, POCT Urinalysis DIP (Proadvantage Device), Urine Culture  Fatigue, unspecified type - unclear etiology. Check labs to eval for  anemia, electrolyte problems - Plan: CBC with Differential/Platelet, Comprehensive metabolic panel, POCT Urinalysis DIP (Proadvantage Device)  Epigastric pain - and belching.  PPI trial - Plan: omeprazole (PRILOSEC) 40 MG capsule  Gastroesophageal reflux disease, unspecified whether esophagitis present - PPI daily, bland diet - Plan: omeprazole (PRILOSEC) 40 MG capsule   Spoke with husband with results--[phone] Rob, as outlined below.  Stat labs: Lab Results  Component Value Date   WBC 16.2 (H) 01/14/2021   HGB 10.5 (L) 01/14/2021   HCT 29.5 (L) 01/14/2021   MCV 85 01/14/2021   PLT 579 (H) 01/14/2021     Chemistry      Component Value Date/Time   NA 134 01/14/2021 1040   K 4.7 01/14/2021 1040   CL 98 01/14/2021 1040   CO2 26 01/14/2021 1040   BUN 51 (H) 01/14/2021 1040   CREATININE 1.11 (H) 01/14/2021 1040   CREATININE 0.58 02/09/2017 0847      Component Value Date/Time   CALCIUM 9.4 01/14/2021 1040   ALKPHOS 404 (H) 01/14/2021 1040   AST 78 (H) 01/14/2021 1040   ALT 89 (H) 01/14/2021 1040   BILITOT 0.4 01/14/2021 1040     Urine: SG 1.015, small blood, protein, nitrite +, trace leuks  D/W husband--elevated WBC, new anemia, mild elevation in Cr, elevated  BUN (and BUN/Cr ratio), elevated LFT's, alk phos. Added on lipase. Abdominal CT ordered--  IMPRESSION: Scattered small nodules in the lung bases bilaterally. No follow-up needed if patient is low-risk (and has no known or suspected primary neoplasm). Non-contrast chest CT can be considered in 12 months if patient is high-risk. Mild wall thickening in the distal stomach without definitive ulcer crater. No other focal abnormality is noted.  Based on these findings, d/w husband starting omeprazole 72m daily. To stay well hydrated, be sure to continue eating. To f/u in 1-2 days for recheck (and f/u labs)  I spent 52 minutes dedicated to the care of this patient, including pre-visit review of records, face to  face time, post-visit ordering of testing and documentation.  ADDENDUM--[facility] reviewed, and it looks like she did NOT have another colonoscopy in 2019, but instead had negative Cologuard.

## 2021-01-15 ENCOUNTER — Other Ambulatory Visit: Payer: Self-pay

## 2021-01-15 ENCOUNTER — Telehealth: Payer: Self-pay

## 2021-01-15 DIAGNOSIS — R109 Unspecified abdominal pain: Secondary | ICD-10-CM

## 2021-01-15 NOTE — Telephone Encounter (Signed)
Pt was called and advised that the referral was put in urgent and to go ahead and place a call to Dr. Earlean Shawl. Pt advised that her husband has called his office already. [place]

## 2021-01-16 ENCOUNTER — Other Ambulatory Visit: Payer: Self-pay

## 2021-01-16 DIAGNOSIS — R1012 Left upper quadrant pain: Secondary | ICD-10-CM | POA: Diagnosis not present

## 2021-01-16 DIAGNOSIS — R7989 Other specified abnormal findings of blood chemistry: Secondary | ICD-10-CM | POA: Diagnosis not present

## 2021-01-16 DIAGNOSIS — R5383 Other fatigue: Secondary | ICD-10-CM | POA: Diagnosis not present

## 2021-01-16 LAB — URINE CULTURE

## 2021-01-16 MED ORDER — CIPROFLOXACIN HCL 500 MG PO TABS: 500.0000 mg | ORAL_TABLET | Freq: Two times a day (BID) | ORAL | 0 refills | Status: DC

## 2021-01-16 NOTE — Telephone Encounter (Signed)
LVM advising pt that a new med was called in for her. [place]

## 2021-01-17 ENCOUNTER — Telehealth: Payer: Self-pay | Admitting: Family Medicine

## 2021-01-17 NOTE — Telephone Encounter (Signed)
Requested records received from [place]

## 2021-01-21 ENCOUNTER — Other Ambulatory Visit: Payer: Self-pay | Admitting: Family Medicine

## 2021-01-21 DIAGNOSIS — I10 Essential (primary) hypertension: Secondary | ICD-10-CM

## 2021-01-23 ENCOUNTER — Telehealth: Payer: Self-pay | Admitting: Family Medicine

## 2021-01-23 NOTE — Telephone Encounter (Signed)
Did she take the ABX prescribed by Dr. Earlean Shawl (levaquin), or me (cipro)? Glad that she is feeling better. She may want to schedule appointment for f/u--to recheck urine, f/u labs.  When to do this depends on how she is feeling.

## 2021-01-23 NOTE — Telephone Encounter (Signed)
Spoke with patient and she did take all of the cipro that you gave her-did not take the levaquin. She said other than the lack of energy she feels good. She is going to wait until next week and she how she feels. If not better, she will call for follow up.

## 2021-01-23 NOTE — Telephone Encounter (Signed)
Pt called and asked that a message be sent to Dr. Tomi Bamberger due to [facility] being out. Pt states that she has finished medication and is feeling better. She states that she has no energy. Please advise pt at [phone]. pt uses [facility] on [address].

## 2021-01-24 ENCOUNTER — Encounter: Payer: Self-pay | Admitting: Orthopaedic Surgery

## 2021-01-24 ENCOUNTER — Ambulatory Visit (INDEPENDENT_AMBULATORY_CARE_PROVIDER_SITE_OTHER): Payer: Medicare Other | Admitting: Orthopaedic Surgery

## 2021-01-24 DIAGNOSIS — M7660 Achilles tendinitis, unspecified leg: Secondary | ICD-10-CM | POA: Diagnosis not present

## 2021-01-24 NOTE — Progress Notes (Signed)
Jodi Duffy comes in today with bilateral Achilles pain which is quite significant for her.  She is never injured these before.  The right hurts worse than left.  She has recently completed a course of Cipro for other type of infection.  She has had fatigue and has been laid up in bed for about 2 weeks with some type of virus or infection.  Laterally in a prone position.  Her [place] test is fortunately negative on both sides.  There is pain along the course of the Achilles on both sides.  She does have severe Achilles tendinitis and there is a correlation with Cipro and ruptures of the tendons.  I Jodi Duffy put her in a heel buildup on both shoes to wear for the next week to 2 weeks and have her work on Achilles stretching using a Thera-Band.  She will also try Voltaren gel twice a day over the Achilles.  She should try to go slow with her mobility to decrease the chance of rupture.  All question concerns were answered and addressed.  Follow-up can be as needed.

## 2021-02-25 DIAGNOSIS — H40033 Anatomical narrow angle, bilateral: Secondary | ICD-10-CM | POA: Diagnosis not present

## 2021-02-25 DIAGNOSIS — H04221 Epiphora due to insufficient drainage, right lacrimal gland: Secondary | ICD-10-CM | POA: Diagnosis not present

## 2021-02-25 DIAGNOSIS — G510 Bell's palsy: Secondary | ICD-10-CM | POA: Diagnosis not present

## 2021-02-25 DIAGNOSIS — H209 Unspecified iridocyclitis: Secondary | ICD-10-CM | POA: Diagnosis not present

## 2021-02-25 DIAGNOSIS — H04123 Dry eye syndrome of bilateral lacrimal glands: Secondary | ICD-10-CM | POA: Diagnosis not present

## 2021-02-25 DIAGNOSIS — H2513 Age-related nuclear cataract, bilateral: Secondary | ICD-10-CM | POA: Diagnosis not present

## 2021-02-25 DIAGNOSIS — H40053 Ocular hypertension, bilateral: Secondary | ICD-10-CM | POA: Diagnosis not present

## 2021-02-25 DIAGNOSIS — H1013 Acute atopic conjunctivitis, bilateral: Secondary | ICD-10-CM | POA: Diagnosis not present

## 2021-03-26 DIAGNOSIS — H43821 Vitreomacular adhesion, right eye: Secondary | ICD-10-CM | POA: Diagnosis not present

## 2021-03-26 DIAGNOSIS — H30033 Focal chorioretinal inflammation, peripheral, bilateral: Secondary | ICD-10-CM | POA: Diagnosis not present

## 2021-03-26 DIAGNOSIS — Z79899 Other long term (current) drug therapy: Secondary | ICD-10-CM | POA: Diagnosis not present

## 2021-03-26 DIAGNOSIS — H2513 Age-related nuclear cataract, bilateral: Secondary | ICD-10-CM | POA: Diagnosis not present

## 2021-03-26 DIAGNOSIS — H44113 Panuveitis, bilateral: Secondary | ICD-10-CM | POA: Diagnosis not present

## 2021-03-27 DIAGNOSIS — H44113 Panuveitis, bilateral: Secondary | ICD-10-CM | POA: Diagnosis not present

## 2021-03-27 DIAGNOSIS — Z79899 Other long term (current) drug therapy: Secondary | ICD-10-CM | POA: Diagnosis not present

## 2021-03-27 DIAGNOSIS — H30033 Focal chorioretinal inflammation, peripheral, bilateral: Secondary | ICD-10-CM | POA: Diagnosis not present

## 2021-04-09 DIAGNOSIS — H40033 Anatomical narrow angle, bilateral: Secondary | ICD-10-CM | POA: Diagnosis not present

## 2021-04-09 DIAGNOSIS — G510 Bell's palsy: Secondary | ICD-10-CM | POA: Diagnosis not present

## 2021-04-09 DIAGNOSIS — H04123 Dry eye syndrome of bilateral lacrimal glands: Secondary | ICD-10-CM | POA: Diagnosis not present

## 2021-04-09 DIAGNOSIS — H209 Unspecified iridocyclitis: Secondary | ICD-10-CM | POA: Diagnosis not present

## 2021-04-09 DIAGNOSIS — H2513 Age-related nuclear cataract, bilateral: Secondary | ICD-10-CM | POA: Diagnosis not present

## 2021-04-09 DIAGNOSIS — H04221 Epiphora due to insufficient drainage, right lacrimal gland: Secondary | ICD-10-CM | POA: Diagnosis not present

## 2021-04-09 DIAGNOSIS — H40053 Ocular hypertension, bilateral: Secondary | ICD-10-CM | POA: Diagnosis not present

## 2021-04-23 DIAGNOSIS — H40053 Ocular hypertension, bilateral: Secondary | ICD-10-CM | POA: Diagnosis not present

## 2021-04-23 DIAGNOSIS — H44113 Panuveitis, bilateral: Secondary | ICD-10-CM | POA: Diagnosis not present

## 2021-04-23 DIAGNOSIS — Z961 Presence of intraocular lens: Secondary | ICD-10-CM | POA: Diagnosis not present

## 2021-04-23 DIAGNOSIS — H02422 Myogenic ptosis of left eyelid: Secondary | ICD-10-CM | POA: Diagnosis not present

## 2021-04-23 DIAGNOSIS — H2513 Age-related nuclear cataract, bilateral: Secondary | ICD-10-CM | POA: Diagnosis not present

## 2021-05-04 ENCOUNTER — Other Ambulatory Visit: Payer: Self-pay | Admitting: Family Medicine

## 2021-05-04 DIAGNOSIS — I10 Essential (primary) hypertension: Secondary | ICD-10-CM

## 2021-05-07 DIAGNOSIS — H2513 Age-related nuclear cataract, bilateral: Secondary | ICD-10-CM | POA: Diagnosis not present

## 2021-05-07 DIAGNOSIS — Z79899 Other long term (current) drug therapy: Secondary | ICD-10-CM | POA: Diagnosis not present

## 2021-05-07 DIAGNOSIS — H30033 Focal chorioretinal inflammation, peripheral, bilateral: Secondary | ICD-10-CM | POA: Diagnosis not present

## 2021-05-07 DIAGNOSIS — H44113 Panuveitis, bilateral: Secondary | ICD-10-CM | POA: Diagnosis not present

## 2021-05-07 DIAGNOSIS — H43821 Vitreomacular adhesion, right eye: Secondary | ICD-10-CM | POA: Diagnosis not present

## 2021-05-27 DIAGNOSIS — H401122 Primary open-angle glaucoma, left eye, moderate stage: Secondary | ICD-10-CM | POA: Diagnosis not present

## 2021-05-27 DIAGNOSIS — H21542 Posterior synechiae (iris), left eye: Secondary | ICD-10-CM | POA: Diagnosis not present

## 2021-05-27 DIAGNOSIS — H2512 Age-related nuclear cataract, left eye: Secondary | ICD-10-CM | POA: Diagnosis not present

## 2021-06-04 DIAGNOSIS — H40053 Ocular hypertension, bilateral: Secondary | ICD-10-CM | POA: Diagnosis not present

## 2021-06-04 DIAGNOSIS — E7849 Other hyperlipidemia: Secondary | ICD-10-CM | POA: Diagnosis not present

## 2021-06-04 DIAGNOSIS — F411 Generalized anxiety disorder: Secondary | ICD-10-CM | POA: Diagnosis not present

## 2021-06-04 DIAGNOSIS — I10 Essential (primary) hypertension: Secondary | ICD-10-CM | POA: Diagnosis not present

## 2021-06-04 DIAGNOSIS — Z01818 Encounter for other preprocedural examination: Secondary | ICD-10-CM | POA: Diagnosis not present

## 2021-06-04 DIAGNOSIS — H2520 Age-related cataract, morgagnian type, unspecified eye: Secondary | ICD-10-CM | POA: Diagnosis not present

## 2021-06-04 DIAGNOSIS — Z961 Presence of intraocular lens: Secondary | ICD-10-CM | POA: Diagnosis not present

## 2021-06-04 DIAGNOSIS — H44119 Panuveitis, unspecified eye: Secondary | ICD-10-CM | POA: Diagnosis not present

## 2021-06-10 DIAGNOSIS — H401111 Primary open-angle glaucoma, right eye, mild stage: Secondary | ICD-10-CM | POA: Diagnosis not present

## 2021-06-10 DIAGNOSIS — H21541 Posterior synechiae (iris), right eye: Secondary | ICD-10-CM | POA: Diagnosis not present

## 2021-06-10 DIAGNOSIS — H2511 Age-related nuclear cataract, right eye: Secondary | ICD-10-CM | POA: Diagnosis not present

## 2021-06-29 ENCOUNTER — Other Ambulatory Visit: Payer: Self-pay | Admitting: Family Medicine

## 2021-06-29 DIAGNOSIS — Z8249 Family history of ischemic heart disease and other diseases of the circulatory system: Secondary | ICD-10-CM

## 2021-06-29 DIAGNOSIS — E785 Hyperlipidemia, unspecified: Secondary | ICD-10-CM

## 2021-07-02 ENCOUNTER — Ambulatory Visit (INDEPENDENT_AMBULATORY_CARE_PROVIDER_SITE_OTHER): Payer: Medicare Other | Admitting: Orthopaedic Surgery

## 2021-07-02 ENCOUNTER — Encounter: Payer: Self-pay | Admitting: Orthopaedic Surgery

## 2021-07-02 ENCOUNTER — Other Ambulatory Visit: Payer: Self-pay

## 2021-07-02 DIAGNOSIS — Z8739 Personal history of other diseases of the musculoskeletal system and connective tissue: Secondary | ICD-10-CM | POA: Insufficient documentation

## 2021-07-02 DIAGNOSIS — Z79899 Other long term (current) drug therapy: Secondary | ICD-10-CM | POA: Diagnosis not present

## 2021-07-02 DIAGNOSIS — H44113 Panuveitis, bilateral: Secondary | ICD-10-CM | POA: Diagnosis not present

## 2021-07-02 DIAGNOSIS — H30033 Focal chorioretinal inflammation, peripheral, bilateral: Secondary | ICD-10-CM | POA: Diagnosis not present

## 2021-07-02 DIAGNOSIS — Z961 Presence of intraocular lens: Secondary | ICD-10-CM | POA: Diagnosis not present

## 2021-07-02 DIAGNOSIS — M25521 Pain in right elbow: Secondary | ICD-10-CM

## 2021-07-02 DIAGNOSIS — H43821 Vitreomacular adhesion, right eye: Secondary | ICD-10-CM | POA: Diagnosis not present

## 2021-07-02 MED ORDER — LIDOCAINE HCL 1 % IJ SOLN
1.0000 mL | INTRAMUSCULAR | Status: AC | PRN
  Administered 2021-07-02: 1 mL

## 2021-07-02 MED ORDER — METHYLPREDNISOLONE ACETATE 40 MG/ML IJ SUSP
20.0000 mg | INTRAMUSCULAR | Status: AC | PRN
  Administered 2021-07-02: 20 mg

## 2021-07-02 NOTE — Progress Notes (Addendum)
Office Visit Note   Patient: Jodi Duffy           Date of Birth: Feb 17, 1951           MRN: [id number] Visit Date: 07/02/2021              Requested by: Denita Lung, MD [place],  [place] [id number] PCP: Denita Lung, MD   Assessment & Plan: Visit Diagnoses:  1. History of right tennis elbow     Plan: Ranijah has recurrent symptoms of lateral epicondylitis or tennis elbow right upper extremity.  I had injected this in 2019 with good relief.  We will repeat injection with Depo-Medrol and monitor response  Follow-Up Instructions: Return if symptoms worsen or fail to improve.   Orders:  No orders of the defined types were placed in this encounter.  No orders of the defined types were placed in this encounter.     Procedures: Hand/UE Inj: R elbow for lateral epicondylitis on 07/02/2021 5:32 PM Details: lateral approach Medications: 1 mL lidocaine 1 %; 20 mg methylPREDNISolone acetate 40 MG/ML     Clinical Data: No additional findings.   Subjective: Chief Complaint  Patient presents with   Right Elbow - Pain  Patient presents today for right elbow pain. She said that it started about two weeks ago with no known injury. Most of her pain is at the lateral aspect of her elbow. Occurs randomly. No numbness, tingling, or swelling. She is right hand dominant.   HPI  Review of Systems   Objective: Vital Signs: Ht '5\' 3"'$  (1.6 m)   Wt 142 lb (64.4 kg)   BMI 25.15 kg/m   Physical Exam Constitutional:      Appearance: She is well-developed.  Pulmonary:     Effort: Pulmonary effort is normal.  Skin:    General: Skin is warm and dry.  Neurological:     Mental Status: She is alert and oriented to person, place, and time.  Psychiatric:        Behavior: Behavior normal.    Ortho Exam right elbow with tenderness over the lateral epicondyles.  No erythema or ecchymosis.  Skin intact.  Some pain with grip and extension and not in flexion  directly over the lateral epicondyles.  Neurologically intact.  Full range of motion of the elbow  Specialty Comments:  No specialty comments available.  Imaging: No results found.   PMFS History: Patient Active Problem List   Diagnosis Date Noted   History of right tennis elbow 07/02/2021   Genetic testing 11/06/2020   Family history of throat cancer    Family history of lung cancer    Family history of cancer of female genital organ    Panuveitis of both eyes 08/21/2020   Total knee replacement status, left 06/14/2019   Cotton wool spots 09/29/2018   Epidermoid cyst of skin 03/17/2018   Essential hypertension 02/10/2018   Other seasonal allergic rhinitis 11/15/2015   Family history of breast cancer in mother 09/06/2014   Dysthymia 09/06/2014   Glaucoma 09/06/2014   Hyperlipidemia LDL goal <100 09/06/2014   S/P TKR (total knee replacement) 08/12/2012   Family history of heart disease in female family member before age 57 07/22/2012   Past Medical History:  Diagnosis Date   Allergy    RHINITIS   Arthritis    Depression    Family history of cancer of female genital organ    Family history of lung cancer  Family history of throat cancer    Glaucoma    Hyperlipidemia    Hypertension    Osteopenia    TMJ syndrome     Family History  Problem Relation Age of Onset   Heart disease Mother        valve replacement   Breast cancer Mother 71   Throat cancer Mother 74       smoker   Heart disease Father        died of brain embolism after hip surgery   Arthritis Father    Arthritis Brother    Breast cancer Maternal Aunt        dx in her 45s   Breast cancer Other        dx in her 80s, maternal first cousin's daughter   Lung cancer Maternal Aunt        dx in her 50s/60s, non-smoker   Vaginal cancer Maternal Aunt        dx in her 50s   Diabetes Neg Hx    Hypertension Neg Hx    Stroke Neg Hx     Past Surgical History:  Procedure Laterality Date   BIOPSY BREAST      BLEPHAROPLASTY     KNEE ARTHROSCOPY     right knee, 1990 and 2012, Dr. Ty Hilts JOINT SURGERY     TONSILLECTOMY     TOTAL KNEE ARTHROPLASTY  08/10/2012   Procedure: TOTAL KNEE ARTHROPLASTY;  Surgeon: Garald Balding, MD;  Location: [facility];  Service: Orthopedics;  Laterality: Right;  Right Total Knee Arthroplasty   TOTAL KNEE ARTHROPLASTY Left 06/14/2019   Procedure: LEFT TOTAL KNEE ARTHROPLASTY;  Surgeon: Garald Balding, MD;  Location: [facility];  Service: Orthopedics;  Laterality: Left;   Social History   Occupational History   Occupation: Medical laboratory scientific officer: [employer]  Tobacco Use   Smoking status: Former    Types: Cigarettes    Quit date: 02/10/1980    Years since quitting: 41.4   Smokeless tobacco: Never  Vaping Use   Vaping Use: Never used  Substance and Sexual Activity   Alcohol use: Yes    Alcohol/week: 1.0 standard drink    Types: 1 Glasses of wine per week    Comment: daily   Drug use: No   Sexual activity: Yes

## 2021-07-30 ENCOUNTER — Ambulatory Visit (INDEPENDENT_AMBULATORY_CARE_PROVIDER_SITE_OTHER): Payer: Medicare Other | Admitting: Orthopaedic Surgery

## 2021-07-30 ENCOUNTER — Ambulatory Visit (INDEPENDENT_AMBULATORY_CARE_PROVIDER_SITE_OTHER): Payer: Medicare Other

## 2021-07-30 ENCOUNTER — Other Ambulatory Visit: Payer: Self-pay

## 2021-07-30 ENCOUNTER — Encounter: Payer: Self-pay | Admitting: Orthopaedic Surgery

## 2021-07-30 DIAGNOSIS — M7521 Bicipital tendinitis, right shoulder: Secondary | ICD-10-CM | POA: Insufficient documentation

## 2021-07-30 DIAGNOSIS — Z8739 Personal history of other diseases of the musculoskeletal system and connective tissue: Secondary | ICD-10-CM | POA: Diagnosis not present

## 2021-07-30 MED ORDER — METHYLPREDNISOLONE ACETATE 40 MG/ML IJ SUSP
40.0000 mg | INTRAMUSCULAR | Status: AC | PRN
Start: 2021-07-30 — End: 2021-07-30
  Administered 2021-07-30: 40 mg via INTRA_ARTICULAR

## 2021-07-30 MED ORDER — BUPIVACAINE HCL 0.25 % IJ SOLN
2.0000 mL | INTRAMUSCULAR | Status: AC | PRN
  Administered 2021-07-30: 2 mL via INTRA_ARTICULAR

## 2021-07-30 MED ORDER — LIDOCAINE HCL 2 % IJ SOLN
2.0000 mL | INTRAMUSCULAR | Status: AC | PRN
  Administered 2021-07-30: 2 mL

## 2021-07-30 NOTE — Progress Notes (Signed)
Office Visit Note   Patient: Jodi Duffy           Date of Birth: Sep 13, 1951           MRN: [id number] Visit Date: 07/30/2021              Requested by: Denita Lung, MD [place],  [place] [id number] PCP: Denita Lung, MD   Assessment & Plan: Visit Diagnoses:  1. History of right tennis elbow   2. Biceps tendinitis of right shoulder     Plan: Did well with the recent injection over the lateral epicondyle right elbow no longer has the tennis elbow symptoms but has developed some pain in the biceps muscle and along the long head of the biceps tendon right shoulder.  Denies any neck pain.  No numbness or tingling.  Has a positive speeds sign and discomfort to palpation along the long head of the biceps.  Long discussion regarding all of the above and will try cortisone injection and monitor response.  If no improvement then would consider an MRI scan of the right shoulder.  I think the predominant problem is biceps long head tendinitis.  There is some possible early arthritis of the shoulder joint  Follow-Up Instructions: Return if symptoms worsen or fail to improve.   Orders:  Orders Placed This Encounter  Procedures   XR Shoulder Right   No orders of the defined types were placed in this encounter.     Procedures: Large Joint Inj: R subacromial bursa on 07/30/2021 2:52 PM Indications: pain and diagnostic evaluation Details: 25 G 1.5 in needle, anterolateral approach  Arthrogram: No  Medications: 2 mL lidocaine 2 %; 40 mg methylPREDNISolone acetate 40 MG/ML; 2 mL bupivacaine 0.25 % Consent was given by the patient. Immediately prior to procedure a time out was called to verify the correct patient, procedure, equipment, support staff and site/side marked as required. Patient was prepped and draped in the usual sterile fashion.      Clinical Data: No additional findings.   Subjective: Chief Complaint  Patient presents with   Right Arm -  Follow-up, Pain  Patient presents today for a one month follow up on her right tennis elbow. She said that shew as given a cortisone injection at her last visit a month ago. It did not help. She has been using a tennis elbow strap as well. She is taking Tylenol and Voltaren gel. She is right hand dominant.  Pain is somewhat nondescript but predominantly perceived in the biceps muscle.  Does workout several times a week but denies any injury or trauma.  No numbness or tingling.  Denies any neck pain.  Does have some discomfort when she first wakes up in the morning along the biceps muscle.  Has not noted any skin changes.  HPI  Review of Systems   Objective: Vital Signs: There were no vitals taken for this visit.  Physical Exam Constitutional:      Appearance: She is well-developed.  Pulmonary:     Effort: Pulmonary effort is normal.  Skin:    General: Skin is warm and dry.  Neurological:     Mental Status: She is alert and oriented to person, place, and time.  Psychiatric:        Behavior: Behavior normal.    Ortho Exam awake alert and oriented x3.  Comfortable sitting in no acute distress.  No pain over the lateral epicondyle right elbow with full range of motion.  No pain with grip.  Does have some discomfort along the biceps muscle which appears to be intact and also along the biceps long head tendon proximally.  Positive Speed sign.  Negative impingement and empty can testing.  No masses  Specialty Comments:  No specialty comments available.  Imaging: XR Shoulder Right  Result Date: 07/30/2021 Films of the right shoulder obtained in 3 projections.  The humeral head is centered about the glenoid.  There is a normal space between the humeral head and acromion.  Mild to moderate AC joint degenerative changes but without hypertrophic changes.  Appear to have some very mild sclerosis in the subchondral region of the humeral head and some very small cyst.  This might represent some  early arthritis.  No acute changes    PMFS History: Patient Active Problem List   Diagnosis Date Noted   Biceps tendinitis of right shoulder 07/30/2021   History of right tennis elbow 07/02/2021   Genetic testing 11/06/2020   Family history of throat cancer    Family history of lung cancer    Family history of cancer of female genital organ    Panuveitis of both eyes 08/21/2020   Total knee replacement status, left 06/14/2019   Cotton wool spots 09/29/2018   Epidermoid cyst of skin 03/17/2018   Essential hypertension 02/10/2018   Other seasonal allergic rhinitis 11/15/2015   Family history of breast cancer in mother 09/06/2014   Dysthymia 09/06/2014   Glaucoma 09/06/2014   Hyperlipidemia LDL goal <100 09/06/2014   S/P TKR (total knee replacement) 08/12/2012   Family history of heart disease in female family member before age 46 07/22/2012   Past Medical History:  Diagnosis Date   Allergy    RHINITIS   Arthritis    Depression    Family history of cancer of female genital organ    Family history of lung cancer    Family history of throat cancer    Glaucoma    Hyperlipidemia    Hypertension    Osteopenia    TMJ syndrome     Family History  Problem Relation Age of Onset   Heart disease Mother        valve replacement   Breast cancer Mother 7   Throat cancer Mother 46       smoker   Heart disease Father        died of brain embolism after hip surgery   Arthritis Father    Arthritis Brother    Breast cancer Maternal Aunt        dx in her 89s   Breast cancer Other        dx in her 27s, maternal first cousin's daughter   Lung cancer Maternal Aunt        dx in her 50s/60s, non-smoker   Vaginal cancer Maternal Aunt        dx in her 13s   Diabetes Neg Hx    Hypertension Neg Hx    Stroke Neg Hx     Past Surgical History:  Procedure Laterality Date   BIOPSY BREAST     BLEPHAROPLASTY     KNEE ARTHROSCOPY     right knee, 1990 and 2012, Dr. Ty Hilts JOINT SURGERY     TONSILLECTOMY     TOTAL KNEE ARTHROPLASTY  08/10/2012   Procedure: TOTAL KNEE ARTHROPLASTY;  Surgeon: Garald Balding, MD;  Location: [facility];  Service: Orthopedics;  Laterality: Right;  Right Total Knee Arthroplasty  TOTAL KNEE ARTHROPLASTY Left 06/14/2019   Procedure: LEFT TOTAL KNEE ARTHROPLASTY;  Surgeon: Garald Balding, MD;  Location: [facility];  Service: Orthopedics;  Laterality: Left;   Social History   Occupational History   Occupation: Medical laboratory scientific officer: [employer]  Tobacco Use   Smoking status: Former    Types: Cigarettes    Quit date: 02/10/1980    Years since quitting: 41.4   Smokeless tobacco: Never  Vaping Use   Vaping Use: Never used  Substance and Sexual Activity   Alcohol use: Yes    Alcohol/week: 1.0 standard drink    Types: 1 Glasses of wine per week    Comment: daily   Drug use: No   Sexual activity: Yes

## 2021-08-05 ENCOUNTER — Other Ambulatory Visit: Payer: Self-pay | Admitting: Family Medicine

## 2021-08-05 DIAGNOSIS — I10 Essential (primary) hypertension: Secondary | ICD-10-CM

## 2021-08-07 ENCOUNTER — Telehealth: Payer: Self-pay

## 2021-08-07 NOTE — Telephone Encounter (Signed)
Called pt to advised of the need for a appt. [place]

## 2021-08-13 DIAGNOSIS — Z1231 Encounter for screening mammogram for malignant neoplasm of breast: Secondary | ICD-10-CM | POA: Diagnosis not present

## 2021-08-13 LAB — HM MAMMOGRAPHY

## 2021-08-21 DIAGNOSIS — H44113 Panuveitis, bilateral: Secondary | ICD-10-CM | POA: Diagnosis not present

## 2021-08-21 DIAGNOSIS — Z79899 Other long term (current) drug therapy: Secondary | ICD-10-CM | POA: Diagnosis not present

## 2021-08-23 DIAGNOSIS — Z23 Encounter for immunization: Secondary | ICD-10-CM | POA: Diagnosis not present

## 2021-09-19 ENCOUNTER — Encounter: Payer: Self-pay | Admitting: Family Medicine

## 2021-09-19 ENCOUNTER — Other Ambulatory Visit: Payer: Self-pay

## 2021-09-19 ENCOUNTER — Ambulatory Visit (INDEPENDENT_AMBULATORY_CARE_PROVIDER_SITE_OTHER): Payer: Medicare Other | Admitting: Family Medicine

## 2021-09-19 VITALS — BP 112/68 | HR 97 | Temp 98.8°F | Ht 63.0 in | Wt 149.6 lb

## 2021-09-19 DIAGNOSIS — F341 Dysthymic disorder: Secondary | ICD-10-CM

## 2021-09-19 DIAGNOSIS — H409 Unspecified glaucoma: Secondary | ICD-10-CM | POA: Diagnosis not present

## 2021-09-19 DIAGNOSIS — Z1211 Encounter for screening for malignant neoplasm of colon: Secondary | ICD-10-CM | POA: Diagnosis not present

## 2021-09-19 DIAGNOSIS — Z9849 Cataract extraction status, unspecified eye: Secondary | ICD-10-CM

## 2021-09-19 DIAGNOSIS — Z96651 Presence of right artificial knee joint: Secondary | ICD-10-CM

## 2021-09-19 DIAGNOSIS — Z8249 Family history of ischemic heart disease and other diseases of the circulatory system: Secondary | ICD-10-CM | POA: Diagnosis not present

## 2021-09-19 DIAGNOSIS — J302 Other seasonal allergic rhinitis: Secondary | ICD-10-CM | POA: Diagnosis not present

## 2021-09-19 DIAGNOSIS — E785 Hyperlipidemia, unspecified: Secondary | ICD-10-CM

## 2021-09-19 DIAGNOSIS — Z23 Encounter for immunization: Secondary | ICD-10-CM | POA: Diagnosis not present

## 2021-09-19 NOTE — Progress Notes (Signed)
   Subjective:    Patient ID: Jodi Duffy, female    DOB: Jul 11, 1951, 70 y.o.   MRN: [id number]  HPI She is here for an interval evaluation.  She states that she family has all of her eye problems taking care of.  She does have glaucoma and is having it taken care of.  She has had cataract removal.  She continues on her blood pressure medication and is not having any difficulty with that.  She is taking Lipitor for her cholesterol.  Continues to quite nicely on Effexor to help keep her psychologically stable.  She is not really using Prilosec.  She has done quite nicely since her knee replacement.  She had a Cologuard about 3 years ago.  Her allergies seem to be under good control.  Social and family history was otherwise reviewed.  She continues to work but is starting to slowly pull away.   Review of Systems     Objective:   Physical Exam Alert and in no distress. Tympanic membranes and canals are normal. Pharyngeal area is normal. Neck is supple without adenopathy or thyromegaly. Cardiac exam shows a regular sinus rhythm without murmurs or gallops. Lungs are clear to auscultation.        Assessment & Plan:  Hyperlipidemia LDL goal <100 - Plan: Lipid panel  Immunization, viral disease - Plan: Pension scheme manager  Dysthymia  Glaucoma, unspecified glaucoma type, unspecified laterality  Other seasonal allergic rhinitis  Family history of heart disease in female family member before age 32  Status post total right knee replacement  Screening for colon cancer - Plan: Cologuard  History of cataract surgery, unspecified laterality Encouraged her to continue to take good care of herself, stay on all the present medications.  Increase physical activity as tolerated to build up her strength and stamina.

## 2021-09-20 LAB — LIPID PANEL
Chol/HDL Ratio: 3.9 ratio (ref 0.0–4.4)
Cholesterol, Total: 235 mg/dL — ABNORMAL HIGH (ref 100–199)
HDL: 60 mg/dL (ref >39)
LDL Chol Calc (NIH): 119 mg/dL — ABNORMAL HIGH (ref 0–99)
Triglycerides: 325 mg/dL — ABNORMAL HIGH (ref 0–149)
VLDL Cholesterol Cal: 56 mg/dL — ABNORMAL HIGH (ref 5–40)

## 2021-09-25 DIAGNOSIS — Z1211 Encounter for screening for malignant neoplasm of colon: Secondary | ICD-10-CM | POA: Diagnosis not present

## 2021-09-27 ENCOUNTER — Other Ambulatory Visit: Payer: Self-pay | Admitting: Family Medicine

## 2021-09-27 DIAGNOSIS — F341 Dysthymic disorder: Secondary | ICD-10-CM

## 2021-09-27 DIAGNOSIS — Z8249 Family history of ischemic heart disease and other diseases of the circulatory system: Secondary | ICD-10-CM

## 2021-09-27 DIAGNOSIS — E785 Hyperlipidemia, unspecified: Secondary | ICD-10-CM

## 2021-10-02 ENCOUNTER — Telehealth (INDEPENDENT_AMBULATORY_CARE_PROVIDER_SITE_OTHER): Payer: Medicare Other | Admitting: Family Medicine

## 2021-10-02 ENCOUNTER — Encounter: Payer: Self-pay | Admitting: Family Medicine

## 2021-10-02 ENCOUNTER — Other Ambulatory Visit: Payer: Self-pay

## 2021-10-02 VITALS — Temp 99.2°F | Wt 149.0 lb

## 2021-10-02 DIAGNOSIS — E785 Hyperlipidemia, unspecified: Secondary | ICD-10-CM | POA: Diagnosis not present

## 2021-10-02 DIAGNOSIS — J069 Acute upper respiratory infection, unspecified: Secondary | ICD-10-CM

## 2021-10-02 LAB — COLOGUARD: COLOGUARD: NEGATIVE

## 2021-10-02 NOTE — Progress Notes (Signed)
   Subjective:    Patient ID: Jodi Duffy, female    DOB: 08-03-1951, 70 y.o.   MRN: [id number]  HPI Documentation for virtual audio and video telecommunications through [facility] encounter:  The patient was located at home. 2 patient identifiers used.  The provider was located in the office. The patient did consent to this visit and is aware of possible charges through their insurance for this visit. The other persons participating in this telemedicine service were none. Time spent on call was 5 minutes and in review of previous records >15 minutes total for counseling and coordination of care. This virtual service is not related to other E/M service within previous 7 days.  She states that last Friday she developed a slight sore throat with some ear congestion, dry cough, nasal congestion but no fever, chills, earache or shortness of breath.  She did test for COVID and was negative. She also has concerns over recent lipid panel which did show elevated triglycerides and an LDL of 119.  Review of Systems     Objective:   Physical Exam Alert and in no distress otherwise not examined.  Her voice seems normal.       Assessment & Plan:   Viral URI with cough  Hyperlipidemia LDL goal <100 Recommend supportive care for her viral URI including Tylenol and use of Afrin nasal spray only at night.  If she gets worse she will call me. Then discussed the lipid panel with her.  Explained that the triglyceride to be the main issue and recommend cutting back on carbohydrates and also alcohol.

## 2021-10-07 ENCOUNTER — Telehealth: Payer: Self-pay | Admitting: Family Medicine

## 2021-10-07 MED ORDER — AMOXICILLIN 875 MG PO TABS: 875.0000 mg | ORAL_TABLET | Freq: Two times a day (BID) | ORAL | 0 refills | Status: DC

## 2021-10-07 MED ORDER — HYDROCOD POLST-CPM POLST ER 10-8 MG/5ML PO SUER: 5.0000 mL | Freq: Two times a day (BID) | ORAL | 0 refills | Status: DC | PRN

## 2021-10-07 NOTE — Telephone Encounter (Signed)
Pt had virtual on 11/21 and said she was advised to call back today if she felt worse or didn't get better. She said she is worse, her voice is gone and she is still coughing. She wanted to see if she can have cough syrup called in. She can be reached at [phone]

## 2021-10-08 NOTE — Telephone Encounter (Signed)
Pt was abx and med . Kh

## 2021-11-07 ENCOUNTER — Other Ambulatory Visit: Payer: Self-pay | Admitting: Family Medicine

## 2021-11-07 DIAGNOSIS — I10 Essential (primary) hypertension: Secondary | ICD-10-CM

## 2021-11-08 ENCOUNTER — Other Ambulatory Visit: Payer: Self-pay | Admitting: Family Medicine

## 2021-11-08 DIAGNOSIS — I10 Essential (primary) hypertension: Secondary | ICD-10-CM

## 2021-11-22 ENCOUNTER — Ambulatory Visit (INDEPENDENT_AMBULATORY_CARE_PROVIDER_SITE_OTHER): Payer: Medicare Other

## 2021-11-22 VITALS — Ht 63.0 in | Wt 145.0 lb

## 2021-11-22 DIAGNOSIS — Z0000 Encounter for general adult medical examination without abnormal findings: Secondary | ICD-10-CM | POA: Diagnosis not present

## 2021-11-22 NOTE — Progress Notes (Signed)
I connected with Jodi Duffy today by telephone and verified that I am speaking with the correct person using two identifiers. Location patient: home Location provider: work Persons participating in the virtual visit: [place], [place] LPN.   I discussed the limitations, risks, security and privacy concerns of performing an evaluation and management service by telephone and the availability of in person appointments. I also discussed with the patient that there may be a patient responsible charge related to this service. The patient expressed understanding and verbally consented to this telephonic visit.    Interactive audio and video telecommunications were attempted between this provider and patient, however failed, due to patient having technical difficulties OR patient did not have access to video capability.  We continued and completed visit with audio only.     Vital signs may be patient reported or missing.  Subjective:   Jodi Duffy is a 71 y.o. female who presents for Medicare Annual (Subsequent) preventive examination.  Review of Systems     Cardiac Risk Factors include: advanced age (>76men, >53 women);dyslipidemia;hypertension     Objective:    Today's Vitals   11/22/21 0811  Weight: 145 lb (65.8 kg)  Height: 5\' 3"  (1.6 m)   Body mass index is 25.69 kg/m.  Advanced Directives 11/22/2021 08/21/2020 08/02/2019 06/14/2019 06/13/2019 05/16/2019 02/10/2018  Does Patient Have a Medical Advance Directive? Yes Yes Yes Yes Yes Yes Yes  Type of Paramedic of [place];Living will Healthcare Power of [place];Living will [place];Living will [place];Living will Living will;Healthcare Power of Attorney -  Does patient want to make changes to medical advance directive? - No - Patient declined No - Patient declined No - Patient declined No - Patient declined - No - Patient  declined  Copy of [place] in Chart? Yes - validated most recent copy scanned in chart (See row information) Yes - validated most recent copy scanned in chart (See row information) - No - copy requested No - copy requested - -    Current Medications (verified) Outpatient Encounter Medications as of 11/22/2021  Medication Sig   atorvastatin (LIPITOR) 10 MG tablet TAKE 1 TABLET(10 MG) BY MOUTH DAILY   cetirizine (ZYRTEC) 10 MG chewable tablet Chew 10 mg by mouth daily.   cholecalciferol (VITAMIN D) 1000 units tablet Take 1,000 Units by mouth daily.   cyanocobalamin 1000 MCG tablet Take by mouth.   Multiple Vitamins-Minerals (MULTIVITAMIN WITH MINERALS) tablet Take 1 tablet by mouth daily. Women's Multivitamin 50+   mycophenolate (CELLCEPT) 500 MG tablet    naproxen sodium (ALEVE) 220 MG tablet Take 440 mg by mouth every 8 (eight) hours.   olmesartan-hydrochlorothiazide (BENICAR HCT) 40-12.5 MG tablet TAKE 1 TABLET BY MOUTH DAILY   venlafaxine XR (EFFEXOR-XR) 150 MG 24 hr capsule TAKE 1 CAPSULE(150 MG) BY MOUTH DAILY WITH BREAKFAST   amoxicillin (AMOXIL) 875 MG tablet Take 1 tablet (875 mg total) by mouth 2 (two) times daily. (Patient not taking: Reported on 11/22/2021)   chlorpheniramine-HYDROcodone (TUSSIONEX PENNKINETIC ER) 10-8 MG/5ML SUER Take 5 mLs by mouth every 12 (twelve) hours as needed for cough. (Patient not taking: Reported on 11/22/2021)   No facility-administered encounter medications on file as of 11/22/2021.    Allergies (verified) Patient has no known allergies.   History: Past Medical History:  Diagnosis Date   Allergy    RHINITIS   Arthritis    Depression    Family history of  cancer of female genital organ    Family history of lung cancer    Family history of throat cancer    Glaucoma    Hyperlipidemia    Hypertension    Osteopenia    TMJ syndrome    Past Surgical History:  Procedure Laterality Date   BIOPSY BREAST     BLEPHAROPLASTY     KNEE  ARTHROSCOPY     right knee, 1990 and 2012, Dr. Ty Hilts JOINT SURGERY     TONSILLECTOMY     TOTAL KNEE ARTHROPLASTY  08/10/2012   Procedure: TOTAL KNEE ARTHROPLASTY;  Surgeon: Garald Balding, MD;  Location: [facility];  Service: Orthopedics;  Laterality: Right;  Right Total Knee Arthroplasty   TOTAL KNEE ARTHROPLASTY Left 06/14/2019   Procedure: LEFT TOTAL KNEE ARTHROPLASTY;  Surgeon: Garald Balding, MD;  Location: [facility];  Service: Orthopedics;  Laterality: Left;   Family History  Problem Relation Age of Onset   Heart disease Mother        valve replacement   Breast cancer Mother 41   Throat cancer Mother 55       smoker   Heart disease Father        died of brain embolism after hip surgery   Arthritis Father    Arthritis Brother    Breast cancer Maternal Aunt        dx in her 43s   Breast cancer Other        dx in her 89s, maternal first cousin's daughter   Lung cancer Maternal Aunt        dx in her 50s/60s, non-smoker   Vaginal cancer Maternal Aunt        dx in her 66s   Diabetes Neg Hx    Hypertension Neg Hx    Stroke Neg Hx    Social History   Socioeconomic History   Marital status: Married    Spouse name: Not on file   Number of children: Not on file   Years of education: Not on file   Highest education level: Professional school degree (e.g., MD, DDS, DVM, JD)  Occupational History   Occupation: attorney    Employer: [employer]  Tobacco Use   Smoking status: Former    Types: Cigarettes    Quit date: 02/10/1980    Years since quitting: 41.8   Smokeless tobacco: Never  Vaping Use   Vaping Use: Never used  Substance and Sexual Activity   Alcohol use: Yes    Alcohol/week: 1.0 standard drink    Types: 1 Glasses of wine per week    Comment: daily   Drug use: No   Sexual activity: Yes  Other Topics Concern   Not on file  Social History Narrative   Exercises with pilates, golf, and has a trainer   Social Determinants of  Health   Financial Resource Strain: Low Risk    Difficulty of Paying Living Expenses: Not hard at all  Food Insecurity: No Food Insecurity   Worried About Charity fundraiser in the Last Year: Never true   [place] in the Last Year: Never true  Transportation Needs: No Transportation Needs   Lack of Transportation (Medical): No   Lack of Transportation (Non-Medical): No  Physical Activity: Insufficiently Active   Days of Exercise per Week: 2 days   Minutes of Exercise per Session: 50 min  Stress: No Stress Concern Present   Feeling of Stress :  Not at all  Social Connections: Not on file    Tobacco Counseling Counseling given: Not Answered   Clinical Intake:  Pre-visit preparation completed: Yes  Pain : No/denies pain     Nutritional Status: BMI 25 -29 Overweight Nutritional Risks: None Diabetes: No  How often do you need to have someone help you when you read instructions, pamphlets, or other written materials from your doctor or pharmacy?: 1 - Never What is the last grade level you completed in school?: master degree  Diabetic? no  Interpreter Needed?: No  Information entered by :: NAllen LPN   Activities of Daily Living In your present state of health, do you have any difficulty performing the following activities: 11/22/2021  Hearing? N  Vision? N  Difficulty concentrating or making decisions? N  Walking or climbing stairs? N  Dressing or bathing? N  Doing errands, shopping? N  Preparing Food and eating ? N  Using the Toilet? N  In the past six months, have you accidently leaked urine? N  Do you have problems with loss of bowel control? N  Managing your Medications? N  Managing your Finances? N  Housekeeping or managing your Housekeeping? N  Some recent data might be hidden    Patient Care Team: Denita Lung, MD as PCP - General (Family Medicine)  Indicate any recent Medical Services you may have received from other than [facility] providers in  the past year (date may be approximate).     Assessment:   This is a routine wellness examination for Jodi Duffy.  Hearing/Vision screen Vision Screening - Comments:: Regular eye exams,   Dietary issues and exercise activities discussed: Current Exercise Habits: Home exercise routine, Type of exercise: Other - see comments (personal trainer), Time (Minutes): 45, Frequency (Times/Week): 2, Weekly Exercise (Minutes/Week): 90   Goals Addressed             This Visit's Progress    Patient Stated       11/22/2021, stay healthy       Depression Screen PHQ 2/9 Scores 11/22/2021 08/21/2020 08/02/2019 02/10/2018 02/09/2017 11/15/2015 11/15/2015  PHQ - 2 Score 0 0 0 0 0 0 0    Fall Risk Fall Risk  11/22/2021 08/21/2020 08/02/2019 05/16/2019 05/13/2018  Falls in the past year? 0 0 0 0 No  Number falls in past yr: - - - 0 -  Injury with Fall? - - - 0 -  Risk for fall due to : Medication side effect No Fall Risks - - -  Follow up Falls evaluation completed;Education provided;Falls prevention discussed - - - -    FALL RISK PREVENTION PERTAINING TO THE HOME:  Any stairs in or around the home? Yes  If so, are there any without handrails? No  Home free of loose throw rugs in walkways, pet beds, electrical cords, etc? Yes  Adequate lighting in your home to reduce risk of falls? Yes   ASSISTIVE DEVICES UTILIZED TO PREVENT FALLS:  Life alert? No  Use of a cane, walker or w/c? No  Grab bars in the bathroom? No  Shower chair or bench in shower? No  Elevated toilet seat or a handicapped toilet? Yes   TIMED UP AND GO:  Was the test performed? No .      Cognitive Function:     6CIT Screen 11/22/2021  What Year? 0 points  What month? 0 points  What time? 0 points  Count back from 20 0 points  Months in reverse 0 points  Repeat phrase 0 points  Total Score 0    Immunizations Immunization History  Administered Date(s) Administered   DTaP 08/02/1998   Fluad Quad(high Dose 65+) 08/02/2019,  08/21/2020   Influenza Split 08/11/2012   Influenza Whole 09/04/2008, 08/27/2010   Influenza, High Dose Seasonal PF 09/06/2014, 08/07/2018, 08/23/2021   Influenza,inj,Quad PF,6+ Mos 08/08/2013   Influenza-Unspecified 08/17/2014, 08/18/2015, 08/17/2016, 08/17/2017, 08/18/2019   PFIZER(Purple Top)SARS-COV-2 Vaccination 12/07/2019, 12/25/2019, 08/15/2020   Pfizer Covid-19 Vaccine Bivalent Booster 66yrs & up 09/19/2021   Pneumococcal Conjugate-13 11/15/2015   Pneumococcal Polysaccharide-23 08/02/1998, 11/15/2012   Tdap 10/04/2008, 06/21/2018   Zoster Recombinat (Shingrix) 02/09/2017, 04/30/2017   Zoster, Live 08/27/2010    TDAP status: Up to date  Flu Vaccine status: Up to date  Pneumococcal vaccine status: Up to date  Covid-19 vaccine status: Information provided on how to obtain vaccines.   Qualifies for Shingles Vaccine? Yes   Zostavax completed Yes   Shingrix Completed?: Yes  Screening Tests Health Maintenance  Topic Date Due   MAMMOGRAM  08/13/2022   Fecal DNA (Cologuard)  09/25/2024   TETANUS/TDAP  06/21/2028   INFLUENZA VACCINE  Completed   DEXA SCAN  Completed   COVID-19 Vaccine  Completed   Hepatitis C Screening  Completed   Zoster Vaccines- Shingrix  Completed   HPV VACCINES  Aged Out   Pneumonia Vaccine 62+ Years old  Discontinued   COLONOSCOPY (Pts 45-86yrs Insurance coverage will need to be confirmed)  Discontinued    Health Maintenance  There are no preventive care reminders to display for this patient.  Colorectal cancer screening: Type of screening: Cologuard. Completed 10/02/2021. Repeat every 3 years  Mammogram status: Completed 08/13/2021. Repeat every year  Bone Density status: Completed 07/19/2019.   Lung Cancer Screening: (Low Dose CT Chest recommended if Age 14-80 years, 30 pack-year currently smoking OR have quit w/in 15years.) does not qualify.   Lung Cancer Screening Referral: no  Additional Screening:  Hepatitis C Screening: does qualify;  Completed 10/16/2015  Vision Screening: Recommended annual ophthalmology exams for early detection of glaucoma and other disorders of the eye. Is the patient up to date with their annual eye exam?  Yes  Who is the provider or what is the name of the office in which the patient attends annual eye exams?  If pt is not established with a provider, would they like to be referred to a provider to establish care? No .   Dental Screening: Recommended annual dental exams for proper oral hygiene  Community Resource Referral / Chronic Care Management: CRR required this visit?  No   CCM required this visit?  No      Plan:     I have personally reviewed and noted the following in the patients chart:   Medical and social history Use of alcohol, tobacco or illicit drugs  Current medications and supplements including opioid prescriptions.  Functional ability and status Nutritional status Physical activity Advanced directives List of other physicians Hospitalizations, surgeries, and ER visits in previous 12 months Vitals Screenings to include cognitive, depression, and falls Referrals and appointments  In addition, I have reviewed and discussed with patient certain preventive protocols, quality metrics, and best practice recommendations. A written personalized care plan for preventive services as well as general preventive health recommendations were provided to patient.     Kellie Simmering, LPN   [id number]   Nurse Notes: none

## 2021-11-22 NOTE — Patient Instructions (Signed)
Ms. Steinhauser , Thank you for taking time to come for your Medicare Wellness Visit. I appreciate your ongoing commitment to your health goals. Please review the following plan we discussed and let me know if I can assist you in the future.   Screening recommendations/referrals: Colonoscopy: cologuard 10/02/2021, due 10/02/2024 Mammogram: completed 08/13/2021 Bone Density: completed 07/19/2019 Recommended yearly ophthalmology/optometry visit for glaucoma screening and checkup Recommended yearly dental visit for hygiene and checkup  Vaccinations: Influenza vaccine: completed 08/23/2021 Pneumococcal vaccine: completed 11/15/2015 Tdap vaccine: completed 06/21/2018, due 06/21/2028 Shingles vaccine: completed   Covid-19: 09/19/2021, 08/15/2020, 12/25/2019, 12/07/2019  Advanced directives: copy in chart  Conditions/risks identified: none  Next appointment: Follow up in one year for your annual wellness visit    Preventive Care 31 Years and Older, Female Preventive care refers to lifestyle choices and visits with your health care provider that can promote health and wellness. What does preventive care include? A yearly physical exam. This is also called an annual well check. Dental exams once or twice a year. Routine eye exams. Ask your health care provider how often you should have your eyes checked. Personal lifestyle choices, including: Daily care of your teeth and gums. Regular physical activity. Eating a healthy diet. Avoiding tobacco and drug use. Limiting alcohol use. Practicing safe sex. Taking low-dose aspirin every day. Taking vitamin and mineral supplements as recommended by your health care provider. What happens during an annual well check? The services and screenings done by your health care provider during your annual well check will depend on your age, overall health, lifestyle risk factors, and family history of disease. Counseling  Your health care provider may ask you questions  about your: Alcohol use. Tobacco use. Drug use. Emotional well-being. Home and relationship well-being. Sexual activity. Eating habits. History of falls. Memory and ability to understand (cognition). Work and work Statistician. Reproductive health. Screening  You may have the following tests or measurements: Height, weight, and BMI. Blood pressure. Lipid and cholesterol levels. These may be checked every 5 years, or more frequently if you are over 73 years old. Skin check. Lung cancer screening. You may have this screening every year starting at age 6 if you have a 30-pack-year history of smoking and currently smoke or have quit within the past 15 years. Fecal occult blood test (FOBT) of the stool. You may have this test every year starting at age 65. Flexible sigmoidoscopy or colonoscopy. You may have a sigmoidoscopy every 5 years or a colonoscopy every 10 years starting at age 86. Hepatitis C blood test. Hepatitis B blood test. Sexually transmitted disease (STD) testing. Diabetes screening. This is done by checking your blood sugar (glucose) after you have not eaten for a while (fasting). You may have this done every 1-3 years. Bone density scan. This is done to screen for osteoporosis. You may have this done starting at age 81. Mammogram. This may be done every 1-2 years. Talk to your health care provider about how often you should have regular mammograms. Talk with your health care provider about your test results, treatment options, and if necessary, the need for more tests. Vaccines  Your health care provider may recommend certain vaccines, such as: Influenza vaccine. This is recommended every year. Tetanus, diphtheria, and acellular pertussis (Tdap, Td) vaccine. You may need a Td booster every 10 years. Zoster vaccine. You may need this after age 46. Pneumococcal 13-valent conjugate (PCV13) vaccine. One dose is recommended after age 46. Pneumococcal polysaccharide (PPSV23)  vaccine. One dose  is recommended after age 20. Talk to your health care provider about which screenings and vaccines you need and how often you need them. This information is not intended to replace advice given to you by your health care provider. Make sure you discuss any questions you have with your health care provider. Document Released: 11/30/2015 Document Revised: 07/23/2016 Document Reviewed: 09/04/2015 Elsevier Interactive Patient Education  2017 [place] Prevention in the Home Falls can cause injuries. They can happen to people of all ages. There are many things you can do to make your home safe and to help prevent falls. What can I do on the outside of my home? Regularly fix the edges of walkways and driveways and fix any cracks. Remove anything that might make you trip as you walk through a door, such as a raised step or threshold. Trim any bushes or trees on the path to your home. Use bright outdoor lighting. Clear any walking paths of anything that might make someone trip, such as rocks or tools. Regularly check to see if handrails are loose or broken. Make sure that both sides of any steps have handrails. Any raised decks and porches should have guardrails on the edges. Have any leaves, snow, or ice cleared regularly. Use sand or salt on walking paths during winter. Clean up any spills in your garage right away. This includes oil or grease spills. What can I do in the bathroom? Use night lights. Install grab bars by the toilet and in the tub and shower. Do not use towel bars as grab bars. Use non-skid mats or decals in the tub or shower. If you need to sit down in the shower, use a plastic, non-slip stool. Keep the floor dry. Clean up any water that spills on the floor as soon as it happens. Remove soap buildup in the tub or shower regularly. Attach bath mats securely with double-sided non-slip rug tape. Do not have throw rugs and other things on the floor that  can make you trip. What can I do in the bedroom? Use night lights. Make sure that you have a light by your bed that is easy to reach. Do not use any sheets or blankets that are too big for your bed. They should not hang down onto the floor. Have a firm chair that has side arms. You can use this for support while you get dressed. Do not have throw rugs and other things on the floor that can make you trip. What can I do in the kitchen? Clean up any spills right away. Avoid walking on wet floors. Keep items that you use a lot in easy-to-reach places. If you need to reach something above you, use a strong step stool that has a grab bar. Keep electrical cords out of the way. Do not use floor polish or wax that makes floors slippery. If you must use wax, use non-skid floor wax. Do not have throw rugs and other things on the floor that can make you trip. What can I do with my stairs? Do not leave any items on the stairs. Make sure that there are handrails on both sides of the stairs and use them. Fix handrails that are broken or loose. Make sure that handrails are as long as the stairways. Check any carpeting to make sure that it is firmly attached to the stairs. Fix any carpet that is loose or worn. Avoid having throw rugs at the top or bottom of the stairs. If  you do have throw rugs, attach them to the floor with carpet tape. Make sure that you have a light switch at the top of the stairs and the bottom of the stairs. If you do not have them, ask someone to add them for you. What else can I do to help prevent falls? Wear shoes that: Do not have high heels. Have rubber bottoms. Are comfortable and fit you well. Are closed at the toe. Do not wear sandals. If you use a stepladder: Make sure that it is fully opened. Do not climb a closed stepladder. Make sure that both sides of the stepladder are locked into place. Ask someone to hold it for you, if possible. Clearly mark and make sure that you  can see: Any grab bars or handrails. First and last steps. Where the edge of each step is. Use tools that help you move around (mobility aids) if they are needed. These include: Canes. Walkers. Scooters. Crutches. Turn on the lights when you go into a dark area. Replace any light bulbs as soon as they burn out. Set up your furniture so you have a clear path. Avoid moving your furniture around. If any of your floors are uneven, fix them. If there are any pets around you, be aware of where they are. Review your medicines with your doctor. Some medicines can make you feel dizzy. This can increase your chance of falling. Ask your doctor what other things that you can do to help prevent falls. This information is not intended to replace advice given to you by your health care provider. Make sure you discuss any questions you have with your health care provider. Document Released: 08/30/2009 Document Revised: 04/10/2016 Document Reviewed: 12/08/2014 Elsevier Interactive Patient Education  2017 [employer].

## 2021-11-29 DIAGNOSIS — H04221 Epiphora due to insufficient drainage, right lacrimal gland: Secondary | ICD-10-CM | POA: Diagnosis not present

## 2021-11-29 DIAGNOSIS — Z961 Presence of intraocular lens: Secondary | ICD-10-CM | POA: Diagnosis not present

## 2021-11-29 DIAGNOSIS — H04123 Dry eye syndrome of bilateral lacrimal glands: Secondary | ICD-10-CM | POA: Diagnosis not present

## 2021-11-29 DIAGNOSIS — H40053 Ocular hypertension, bilateral: Secondary | ICD-10-CM | POA: Diagnosis not present

## 2021-11-29 DIAGNOSIS — G510 Bell's palsy: Secondary | ICD-10-CM | POA: Diagnosis not present

## 2021-12-10 ENCOUNTER — Other Ambulatory Visit: Payer: Self-pay

## 2021-12-10 ENCOUNTER — Ambulatory Visit (INDEPENDENT_AMBULATORY_CARE_PROVIDER_SITE_OTHER): Payer: Medicare Other | Admitting: Family Medicine

## 2021-12-10 ENCOUNTER — Ambulatory Visit: Payer: Medicare Other | Admitting: Family Medicine

## 2021-12-10 VITALS — BP 130/80 | HR 72 | Wt 146.8 lb

## 2021-12-10 DIAGNOSIS — J029 Acute pharyngitis, unspecified: Secondary | ICD-10-CM | POA: Diagnosis not present

## 2021-12-10 NOTE — Progress Notes (Signed)
° °  Subjective:    Patient ID: Jodi Duffy, female    DOB: 1950-12-16, 71 y.o.   MRN: [id number]  HPI She complains of a several month history of feeling as if she has sandpaper in the back of her throat.  She notes that when she eats it tends to make her want to cough.  She does not complain of food getting stuck or having true reflux symptoms.  Apparently the cough is little bit more prominent in the morning.  No fever, chills, allergy type symptoms.  Review of Systems     Objective:   Physical Exam Alert and in no distress. Tympanic membranes and canals are normal. Pharyngeal area is normal. Neck is supple without adenopathy or thyromegaly.         Assessment & Plan:  Sore throat - Plan: Ambulatory referral to ENT She would like to see Dr. Wilburn Cornelia and I will therefore make the appointment.  Recommend she take Prilosec regularly to see if that has a positive effect on her symptoms.

## 2021-12-26 ENCOUNTER — Other Ambulatory Visit: Payer: Self-pay | Admitting: Family Medicine

## 2021-12-26 DIAGNOSIS — E785 Hyperlipidemia, unspecified: Secondary | ICD-10-CM

## 2021-12-26 DIAGNOSIS — F341 Dysthymic disorder: Secondary | ICD-10-CM

## 2021-12-26 DIAGNOSIS — Z8249 Family history of ischemic heart disease and other diseases of the circulatory system: Secondary | ICD-10-CM

## 2021-12-26 NOTE — Telephone Encounter (Signed)
[facility] is requesting to fill pt effexor. Please advise [facility]

## 2021-12-27 DIAGNOSIS — R0989 Other specified symptoms and signs involving the circulatory and respiratory systems: Secondary | ICD-10-CM | POA: Diagnosis not present

## 2021-12-27 DIAGNOSIS — J029 Acute pharyngitis, unspecified: Secondary | ICD-10-CM | POA: Diagnosis not present

## 2021-12-27 DIAGNOSIS — J0141 Acute recurrent pansinusitis: Secondary | ICD-10-CM | POA: Diagnosis not present

## 2021-12-27 DIAGNOSIS — J342 Deviated nasal septum: Secondary | ICD-10-CM | POA: Diagnosis not present

## 2021-12-27 DIAGNOSIS — R053 Chronic cough: Secondary | ICD-10-CM | POA: Diagnosis not present

## 2022-01-23 DIAGNOSIS — L814 Other melanin hyperpigmentation: Secondary | ICD-10-CM | POA: Diagnosis not present

## 2022-01-23 DIAGNOSIS — L821 Other seborrheic keratosis: Secondary | ICD-10-CM | POA: Diagnosis not present

## 2022-01-23 DIAGNOSIS — Z0000 Encounter for general adult medical examination without abnormal findings: Secondary | ICD-10-CM | POA: Diagnosis not present

## 2022-02-05 ENCOUNTER — Telehealth: Payer: Self-pay | Admitting: Family Medicine

## 2022-02-05 NOTE — Telephone Encounter (Signed)
Pt  called and is requesting a written order be faxed [facility], to have a hearing evaluation  please fax to [phone] ?

## 2022-02-06 NOTE — Telephone Encounter (Signed)
Faxed  ?02/06/22 [place] ?

## 2022-02-12 DIAGNOSIS — H04211 Epiphora due to excess lacrimation, right lacrimal gland: Secondary | ICD-10-CM | POA: Diagnosis not present

## 2022-02-13 DIAGNOSIS — H903 Sensorineural hearing loss, bilateral: Secondary | ICD-10-CM | POA: Diagnosis not present

## 2022-02-25 DIAGNOSIS — H44113 Panuveitis, bilateral: Secondary | ICD-10-CM | POA: Diagnosis not present

## 2022-02-25 DIAGNOSIS — H30033 Focal chorioretinal inflammation, peripheral, bilateral: Secondary | ICD-10-CM | POA: Diagnosis not present

## 2022-02-25 DIAGNOSIS — H43821 Vitreomacular adhesion, right eye: Secondary | ICD-10-CM | POA: Diagnosis not present

## 2022-02-25 DIAGNOSIS — Z79899 Other long term (current) drug therapy: Secondary | ICD-10-CM | POA: Diagnosis not present

## 2022-02-25 DIAGNOSIS — Z961 Presence of intraocular lens: Secondary | ICD-10-CM | POA: Diagnosis not present

## 2022-02-27 ENCOUNTER — Telehealth (INDEPENDENT_AMBULATORY_CARE_PROVIDER_SITE_OTHER): Payer: Medicare Other | Admitting: Family Medicine

## 2022-02-27 ENCOUNTER — Encounter: Payer: Self-pay | Admitting: Family Medicine

## 2022-02-27 VITALS — Temp 98.4°F | Wt 146.0 lb

## 2022-02-27 DIAGNOSIS — J209 Acute bronchitis, unspecified: Secondary | ICD-10-CM

## 2022-02-27 MED ORDER — AMOXICILLIN-POT CLAVULANATE 875-125 MG PO TABS: 1.0000 | ORAL_TABLET | Freq: Two times a day (BID) | ORAL | 0 refills | Status: DC

## 2022-02-27 MED ORDER — HYDROCOD POLI-CHLORPHE POLI ER 10-8 MG/5ML PO SUER: 5.0000 mL | Freq: Two times a day (BID) | ORAL | 0 refills | Status: DC | PRN

## 2022-02-27 NOTE — Progress Notes (Signed)
? ?  Subjective:  ? ? Patient ID: Jodi Duffy, female    DOB: 08-Mar-1951, 71 y.o.   MRN: [id number] ? ?HPI ?Documentation for virtual audio and video telecommunications through [place] encounter: ?The patient was located at home. 2 patient identifiers used.  ?The provider was located in the office. ?The patient did consent to this visit and is aware of possible charges through their insurance for this visit. ?The other persons participating in this telemedicine service were none. ?Time spent on call was 5 minutes and in review of previous records >20 minutes total for counseling and coordination of care. ?This virtual service is not related to other E/M service within previous 7 days.  ?She complains that 2 weeks ago she had the onset of sore throat, cough and congestion and earache.  Earache is gone away however the cough has now become slightly productive but no fever, chills, earache.  She has not done any COVID testing.  In the past this has lingered until she was given an antibiotic. ? ?Review of Systems ? ?   ?Objective:  ? Physical Exam ?Alert and in no distress with a hoarse voice.  Slightly toxic appearing. ? ? ? ?   ?Assessment & Plan:  ?Acute bronchitis, unspecified organism - Plan: amoxicillin-clavulanate (AUGMENTIN) 875-125 MG tablet, chlorpheniramine-HYDROcodone (TUSSIONEX PENNKINETIC ER) 10-8 MG/5ML ?This combination has worked well in the past and I will therefore try it again. ? ?

## 2022-03-05 DIAGNOSIS — H04211 Epiphora due to excess lacrimation, right lacrimal gland: Secondary | ICD-10-CM | POA: Diagnosis not present

## 2022-03-14 ENCOUNTER — Encounter: Payer: Self-pay | Admitting: Family Medicine

## 2022-03-26 ENCOUNTER — Other Ambulatory Visit: Payer: Self-pay | Admitting: Family Medicine

## 2022-03-26 DIAGNOSIS — E785 Hyperlipidemia, unspecified: Secondary | ICD-10-CM

## 2022-03-26 DIAGNOSIS — Z8249 Family history of ischemic heart disease and other diseases of the circulatory system: Secondary | ICD-10-CM

## 2022-03-27 ENCOUNTER — Encounter: Payer: Self-pay | Admitting: Family Medicine

## 2022-03-27 ENCOUNTER — Ambulatory Visit (INDEPENDENT_AMBULATORY_CARE_PROVIDER_SITE_OTHER): Payer: Medicare Other | Admitting: Family Medicine

## 2022-03-27 VITALS — BP 134/82 | HR 83 | Temp 98.1°F | Ht 63.0 in | Wt 144.2 lb

## 2022-03-27 DIAGNOSIS — F341 Dysthymic disorder: Secondary | ICD-10-CM

## 2022-03-27 DIAGNOSIS — Z96651 Presence of right artificial knee joint: Secondary | ICD-10-CM

## 2022-03-27 DIAGNOSIS — Z1379 Encounter for other screening for genetic and chromosomal anomalies: Secondary | ICD-10-CM | POA: Diagnosis not present

## 2022-03-27 DIAGNOSIS — E785 Hyperlipidemia, unspecified: Secondary | ICD-10-CM | POA: Diagnosis not present

## 2022-03-27 DIAGNOSIS — J302 Other seasonal allergic rhinitis: Secondary | ICD-10-CM

## 2022-03-27 DIAGNOSIS — Z803 Family history of malignant neoplasm of breast: Secondary | ICD-10-CM | POA: Diagnosis not present

## 2022-03-27 DIAGNOSIS — H409 Unspecified glaucoma: Secondary | ICD-10-CM | POA: Diagnosis not present

## 2022-03-27 DIAGNOSIS — Z8249 Family history of ischemic heart disease and other diseases of the circulatory system: Secondary | ICD-10-CM | POA: Diagnosis not present

## 2022-03-27 DIAGNOSIS — I10 Essential (primary) hypertension: Secondary | ICD-10-CM

## 2022-03-27 MED ORDER — VENLAFAXINE HCL ER 150 MG PO CP24: 150.0000 mg | ORAL_CAPSULE | Freq: Every day | ORAL | 3 refills | Status: DC

## 2022-03-27 MED ORDER — OLMESARTAN MEDOXOMIL-HCTZ 40-12.5 MG PO TABS: 1.0000 | ORAL_TABLET | Freq: Every day | ORAL | 3 refills | Status: DC

## 2022-03-27 MED ORDER — ATORVASTATIN CALCIUM 10 MG PO TABS: ORAL_TABLET | ORAL | 3 refills | Status: DC

## 2022-03-27 NOTE — Progress Notes (Addendum)
Subjective: ? Jodi Duffy is a 71 y.o. female who presents for ?Chief Complaint  ?Patient presents with  ? Med check   ?  Med check plus follow AWV 11/22/2021  ?She has been having a lot of difficulty with her eyes and recently had a procedure done that apparently did not go well.  Botox was used and now she does have right eye droop because of that.  She has had previous cataract as well as glaucoma surgery.  She continues on olmesartan/HCTZ for her blood pressure.  Effexor seems to be helping her deal psychologically with what is going on in her world.  Continues on Lipitor without difficulty.  Her allergies seem to be under good control.  She has had previous bilateral TKR and seems to be doing well with that.  She has a family history of multiple cancers and has had genetic testing.  Apparently the testing did not show any specific issues.  She does get appropriate follow-up on mammograms. ? ? ?The following portions of the patient's history were reviewed and updated as appropriate: allergies, current medications, past family history, past medical history, past social history, past surgical history and problem list. ? ?ROS ?Otherwise as in subjective above ? ?Objective: ?BP 134/82   Pulse 83   Temp 98.1 ?F (36.7 ?C)   Ht '5\' 3"'$  (1.6 m)   Wt 144 lb 3.2 oz (65.4 kg)   SpO2 97%   BMI 25.54 kg/m?  ? ?General appearance: alert, no distress, well developed, well nourished ?HEENT: normocephalic, sclerae anicteric, conjunctiva pink and moist, TMs pearly, nares patent, no discharge or erythema, pharynx normal ?Oral cavity: MMM, no lesions ?Neck: supple, no lymphadenopathy, no thyromegaly, no masses ?Heart: RRR, normal S1, S2, no murmurs ?Lungs: CTA bilaterally, no wheezes, rhonchi, or rales ?Abdomen: +bs, soft, non tender, non distended, no masses, no hepatomegaly, no splenomegaly ?Pulses: 2+ radial pulses, 2+ pedal pulses, normal cap refill ?Ext: no edema ? ? ?Assessment: ?Encounter Diagnoses  ?Name Primary?  ?  Dysthymia   ? Glaucoma, unspecified glaucoma type, unspecified laterality   ? Status post total right knee replacement   ? Essential hypertension Yes  ? Other seasonal allergic rhinitis   ? Hyperlipidemia LDL goal <100   ? Genetic testing   ? Family history of breast cancer in mother   ? Family history of heart disease in female family member before age 73   ? ? ? ?Plan: ? ? ?Jodi Duffy was seen today for med check . ? ?Diagnoses and all orders for this visit: ? ?Essential hypertension ?-     olmesartan-hydrochlorothiazide (BENICAR HCT) 40-12.5 MG tablet; Take 1 tablet by mouth daily. ? ?Dysthymia ?-     venlafaxine XR (EFFEXOR-XR) 150 MG 24 hr capsule; Take 1 capsule (150 mg total) by mouth daily with breakfast. ? ?Glaucoma, unspecified glaucoma type, unspecified laterality ? ?Status post total right knee replacement ? ?Other seasonal allergic rhinitis ? ?Hyperlipidemia LDL goal <100 ?-     atorvastatin (LIPITOR) 10 MG tablet; TAKE 1 TABLET(10 MG) BY MOUTH DAILY ?-     Lipid panel ? ?Genetic testing ? ?Family history of breast cancer in mother ? ?Family history of heart disease in female family member before age 85 ?-     atorvastatin (LIPITOR) 10 MG tablet; TAKE 1 TABLET(10 MG) BY MOUTH DAILY ? ? ? ?Follow up: One year for Med check plus ?

## 2022-03-27 NOTE — Addendum Note (Signed)
Addended by: Denita Lung on: 03/27/2022 01:37 PM ? ? Modules accepted: Orders ? ?

## 2022-03-27 NOTE — Patient Instructions (Signed)
?  Jodi Duffy , ?Thank you for taking time to come for your Medicare Wellness Visit. I appreciate your ongoing commitment to your health goals. Please review the following plan we discussed and let me know if I can assist you in the future.  ? ?These are the goals we discussed: ? Goals   ? ?  Patient Stated   ?  11/22/2021, stay healthy ?  ? ?  ?  ?This is a list of the screening recommended for you and due dates:  ?Health Maintenance  ?Topic Date Due  ? Flu Shot  06/17/2022  ? Mammogram  08/13/2022  ? Cologuard (Stool DNA test)  09/25/2024  ? Tetanus Vaccine  06/21/2028  ? DEXA scan (bone density measurement)  Completed  ? COVID-19 Vaccine  Completed  ? Hepatitis C Screening: USPSTF Recommendation to screen - Ages 82-79 yo.  Completed  ? Zoster (Shingles) Vaccine  Completed  ? HPV Vaccine  Aged Out  ? Pneumonia Vaccine  Discontinued  ? Colon Cancer Screening  Discontinued  ? ? ? ? ?

## 2022-03-28 LAB — LIPID PANEL
Chol/HDL Ratio: 3.2 ratio (ref 0.0–4.4)
Cholesterol, Total: 210 mg/dL — ABNORMAL HIGH (ref 100–199)
HDL: 66 mg/dL (ref >39)
LDL Chol Calc (NIH): 114 mg/dL — ABNORMAL HIGH (ref 0–99)
Triglycerides: 173 mg/dL — ABNORMAL HIGH (ref 0–149)
VLDL Cholesterol Cal: 30 mg/dL (ref 5–40)

## 2022-03-28 MED ORDER — ATORVASTATIN CALCIUM 20 MG PO TABS: 20.0000 mg | ORAL_TABLET | Freq: Every day | ORAL | 3 refills | Status: DC

## 2022-03-28 NOTE — Addendum Note (Signed)
Addended by: Denita Lung on: 03/28/2022 12:55 PM ? ? Modules accepted: Orders ? ?

## 2022-05-11 ENCOUNTER — Other Ambulatory Visit: Payer: Self-pay | Admitting: Family Medicine

## 2022-05-11 DIAGNOSIS — I10 Essential (primary) hypertension: Secondary | ICD-10-CM

## 2022-05-13 ENCOUNTER — Ambulatory Visit (INDEPENDENT_AMBULATORY_CARE_PROVIDER_SITE_OTHER): Payer: Medicare Other | Admitting: Family Medicine

## 2022-05-13 ENCOUNTER — Encounter: Payer: Self-pay | Admitting: Family Medicine

## 2022-05-13 VITALS — BP 130/70 | HR 88 | Temp 99.0°F | Wt 146.8 lb

## 2022-05-13 DIAGNOSIS — R053 Chronic cough: Secondary | ICD-10-CM

## 2022-05-13 NOTE — Progress Notes (Signed)
   Subjective:    Patient ID: Jodi Duffy, female    DOB: Mar 31, 1951, 70 y.o.   MRN: [id number]  HPI She complains of intermittent difficulty with cough but no fever, chills, shortness of breath, chest congestion.  It is not productive.  She does have underlying allergies but usually when it is a change in seasons.  The cough does seem to be worse at night.  She cannot necessarily relate this to any particular foods but did mention chocolate.   Review of Systems     Objective:   Physical Exam Alert and in no distress. Tympanic membranes and canals are normal. Pharyngeal area is normal. Neck is supple without adenopathy or thyromegaly. Cardiac exam shows a regular sinus rhythm without murmurs or gallops. Lungs are clear to auscultation.        Assessment & Plan:  Chronic cough I explained that there are many different reasons behind a cough.  At this point I do not think it is pulmonary or allergy related.  Could possibly be reflux.  She is to take double dosing of the PPI for the next month.  If this works and we will keep her on that if not might need to pursue other avenues including eventually being seen by pulmonary.  She was comfortable with that.

## 2022-05-21 ENCOUNTER — Ambulatory Visit
Admission: RE | Admit: 2022-05-21 | Discharge: 2022-05-21 | Disposition: A | Discharge status: Home / Self Care | Payer: Medicare Other | Source: Ambulatory Visit | Attending: Family Medicine | Admitting: Family Medicine

## 2022-05-21 ENCOUNTER — Ambulatory Visit (INDEPENDENT_AMBULATORY_CARE_PROVIDER_SITE_OTHER): Payer: Medicare Other | Admitting: Family Medicine

## 2022-05-21 ENCOUNTER — Encounter: Payer: Self-pay | Admitting: Family Medicine

## 2022-05-21 VITALS — BP 136/80 | HR 90 | Temp 98.8°F | Wt 145.2 lb

## 2022-05-21 DIAGNOSIS — J209 Acute bronchitis, unspecified: Secondary | ICD-10-CM

## 2022-05-21 DIAGNOSIS — R053 Chronic cough: Secondary | ICD-10-CM | POA: Diagnosis not present

## 2022-05-21 MED ORDER — HYDROCOD POLI-CHLORPHE POLI ER 10-8 MG/5ML PO SUER: 5.0000 mL | Freq: Two times a day (BID) | ORAL | 0 refills | Status: DC | PRN

## 2022-05-21 MED ORDER — BENZONATATE 200 MG PO CAPS: 200.0000 mg | ORAL_CAPSULE | Freq: Three times a day (TID) | ORAL | 0 refills | Status: DC | PRN

## 2022-05-21 MED ORDER — AMOXICILLIN-POT CLAVULANATE 875-125 MG PO TABS: 1.0000 | ORAL_TABLET | Freq: Two times a day (BID) | ORAL | 0 refills | Status: DC

## 2022-05-21 NOTE — Progress Notes (Addendum)
   Subjective:    Patient ID: Jodi Duffy, female    DOB: 20-Aug-1951, 71 y.o.   MRN: [id number]  HPI She is here for evaluation of worsening cough.  Since last being seen in spite of the PPI, the cough is gotten worse with more nasal and chest congestion that is intermittently productive,now interfering with her sleep.  No fever, chills, sore throat, earache.  Her allergies are not causing any trouble.  She does have a trip planned to [place] in 1 week.   Review of Systems     Objective:   Physical Exam Alert and in no distress. Tympanic membranes and canals are normal. Pharyngeal area is normal. Neck is supple without adenopathy or thyromegaly. Cardiac exam shows a regular sinus rhythm without murmurs or gallops. Lungs are clear to auscultation.        Assessment & Plan:  Acute bronchitis, unspecified organism - Plan: CBC with Differential/Platelet, Comprehensive metabolic panel, DG Chest 2 View, amoxicillin-clavulanate (AUGMENTIN) 875-125 MG tablet, chlorpheniramine-HYDROcodone (TUSSIONEX PENNKINETIC ER) 10-8 MG/5ML, benzonatate (TESSALON) 200 MG capsule Since she has had a previous history of cough and not responding to the PPI, I think blood work and x-rays is appropriate.  If still continued trouble with this, will refer to pulmonary.

## 2022-05-22 LAB — CBC WITH DIFFERENTIAL/PLATELET
Basophils Absolute: 0 10*3/uL (ref 0.0–0.2)
Basos: 0 %
EOS (ABSOLUTE): 0.1 10*3/uL (ref 0.0–0.4)
Eos: 1 %
Hematocrit: 36.9 % (ref 34.0–46.6)
Hemoglobin: 12.6 g/dL (ref 11.1–15.9)
Immature Grans (Abs): 0 10*3/uL (ref 0.0–0.1)
Immature Granulocytes: 0 %
Lymphocytes Absolute: 1.4 10*3/uL (ref 0.7–3.1)
Lymphs: 20 %
MCH: 30.4 pg (ref 26.6–33.0)
MCHC: 34.1 g/dL (ref 31.5–35.7)
MCV: 89 fL (ref 79–97)
Monocytes Absolute: 0.5 10*3/uL (ref 0.1–0.9)
Monocytes: 7 %
Neutrophils Absolute: 5 10*3/uL (ref 1.4–7.0)
Neutrophils: 72 %
Platelets: 279 10*3/uL (ref 150–450)
RBC: 4.14 x10E6/uL (ref 3.77–5.28)
RDW: 13.5 % (ref 11.7–15.4)
WBC: 7 10*3/uL (ref 3.4–10.8)

## 2022-05-22 LAB — COMPREHENSIVE METABOLIC PANEL
ALT: 19 IU/L (ref 0–32)
AST: 16 IU/L (ref 0–40)
Albumin/Globulin Ratio: 2 (ref 1.2–2.2)
Albumin: 4.5 g/dL (ref 3.7–4.7)
Alkaline Phosphatase: 92 IU/L (ref 44–121)
BUN/Creatinine Ratio: 27 (ref 12–28)
BUN: 20 mg/dL (ref 8–27)
Bilirubin Total: <0.2 mg/dL (ref 0.0–1.2)
CO2: 23 mmol/L (ref 20–29)
Calcium: 9.6 mg/dL (ref 8.7–10.3)
Chloride: 96 mmol/L (ref 96–106)
Creatinine, Ser: 0.73 mg/dL (ref 0.57–1.00)
Globulin, Total: 2.3 g/dL (ref 1.5–4.5)
Glucose: 100 mg/dL — ABNORMAL HIGH (ref 70–99)
Potassium: 4.2 mmol/L (ref 3.5–5.2)
Sodium: 136 mmol/L (ref 134–144)
Total Protein: 6.8 g/dL (ref 6.0–8.5)
eGFR: 87 mL/min/{1.73_m2} (ref >59)

## 2022-05-23 ENCOUNTER — Telehealth: Payer: Self-pay

## 2022-05-23 NOTE — Telephone Encounter (Signed)
Pt called to advise that she is still coughing during the day but not as much at night. The cough is still deep. She also reports that she is now sleeping at hs . Pt stated she will be around all weekend . [place]

## 2022-05-26 ENCOUNTER — Telehealth: Payer: Self-pay

## 2022-05-26 DIAGNOSIS — J209 Acute bronchitis, unspecified: Secondary | ICD-10-CM

## 2022-05-26 MED ORDER — HYDROCOD POLI-CHLORPHE POLI ER 10-8 MG/5ML PO SUER: 5.0000 mL | Freq: Two times a day (BID) | ORAL | 0 refills | Status: DC | PRN

## 2022-05-26 MED ORDER — AMOXICILLIN-POT CLAVULANATE 875-125 MG PO TABS
1.0000 | ORAL_TABLET | Freq: Two times a day (BID) | ORAL | 0 refills | Status: DC
Start: 2022-05-26 — End: 2022-06-26

## 2022-05-26 NOTE — Telephone Encounter (Signed)
Pt. Called stating she wanted you to call her back on her cell phone [phone] regarding her bronchitis.

## 2022-05-26 NOTE — Telephone Encounter (Signed)
She states that she is roughly 70% better but is getting ready to go on a trip to [place].  I will go ahead and renew the cough med and the antibiotic.

## 2022-06-10 ENCOUNTER — Encounter: Payer: Self-pay | Admitting: Family Medicine

## 2022-06-10 ENCOUNTER — Telehealth (INDEPENDENT_AMBULATORY_CARE_PROVIDER_SITE_OTHER): Payer: Medicare Other | Admitting: Family Medicine

## 2022-06-10 VITALS — Temp 99.0°F | Wt 145.0 lb

## 2022-06-10 DIAGNOSIS — U071 COVID-19: Secondary | ICD-10-CM | POA: Diagnosis not present

## 2022-06-10 MED ORDER — NIRMATRELVIR/RITONAVIR (PAXLOVID)TABLET: 3.0000 | ORAL_TABLET | Freq: Two times a day (BID) | ORAL | 0 refills | Status: AC

## 2022-06-10 NOTE — Progress Notes (Signed)
   Subjective:    Patient ID: Jodi Duffy, female    DOB: 11/29/1950, 71 y.o.   MRN: [id number]  HPI Documentation for virtual audio and video telecommunications through [place] encounter: The patient was located at home. 2 patient identifiers used.  The provider was located in the office. The patient did consent to this visit and is aware of possible charges through their insurance for this visit. The other persons participating in this telemedicine service were none. Time spent on call was 5 minutes and in review of previous records >15 minutes total for counseling and coordination of care. This virtual service is not related to other E/M service within previous 7 days.  She recently returned from a trip to the [place].  Prior to going there she did have difficulty with bronchitis which was slowly getting better.  2 days ago she noted an increase in her cough with a slight fever, chest congestion, hoarse voice and fatigue.  She tested yesterday for COVID and was positive.  Review of Systems     Objective:   Physical Exam Alert and in no distress otherwise not examined.  No tachypnea noted.  She did not appear toxic.       Assessment & Plan:  COVID-19 - Plan: nirmatrelvir/ritonavir EUA (PAXLOVID) 20 x 150 MG & 10 x '100MG'$  TABS I explained that under the circumstances I think it is reasonable to go ahead and treat her with Paxlovid.  Recommend conservative care.  Explained that she has a total of 5 days which should end on Friday and if she is doing better by then she can then feel free to get out in public with the mask for another 5 days.  If her symptoms worsen in regard to fever, shortness of breath and worsening cough, further evaluation and treatment will be needed.

## 2022-06-24 ENCOUNTER — Telehealth: Payer: Self-pay

## 2022-06-24 NOTE — Telephone Encounter (Signed)
Left message for pt to call back  °

## 2022-06-24 NOTE — Telephone Encounter (Signed)
-----   Message from Jerene Bears, MD sent at 06/24/2022 10:41 AM EDT ----- Jodi Duffy patient Friend of mine, his mother Reportedly issues with dysphagia or GERD, ?  I am happy to accept her. Can you please reach out to her determine what GI issues she is having and then schedule to see me You can triage the urgency based on what she says. If urgent we can see how to work her in Thanks [place]  Her work #: [phone] Her cell #: [phone]

## 2022-06-25 NOTE — Telephone Encounter (Signed)
Pt states she is not having trouble swallowing, she is having coughing fits. States that something irritates her throat and then she has the coughing that at times takes her breath away. States this has been going on for a couple of months. Pt scheduled to see Dr. Hilarie Fredrickson tomorrow at 9:10am. Pt aware of appt.

## 2022-06-26 ENCOUNTER — Other Ambulatory Visit (HOSPITAL_COMMUNITY): Payer: Self-pay

## 2022-06-26 ENCOUNTER — Ambulatory Visit (INDEPENDENT_AMBULATORY_CARE_PROVIDER_SITE_OTHER): Payer: Medicare Other | Admitting: Internal Medicine

## 2022-06-26 ENCOUNTER — Encounter: Payer: Self-pay | Admitting: Internal Medicine

## 2022-06-26 VITALS — BP 134/70 | HR 97 | Ht 63.0 in | Wt 146.1 lb

## 2022-06-26 DIAGNOSIS — K219 Gastro-esophageal reflux disease without esophagitis: Secondary | ICD-10-CM

## 2022-06-26 DIAGNOSIS — R053 Chronic cough: Secondary | ICD-10-CM

## 2022-06-26 DIAGNOSIS — R1310 Dysphagia, unspecified: Secondary | ICD-10-CM

## 2022-06-26 DIAGNOSIS — R059 Cough, unspecified: Secondary | ICD-10-CM

## 2022-06-26 NOTE — Progress Notes (Signed)
Patient ID: Jodi Duffy, female   DOB: 02-May-1951, 71 y.o.   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  She is here alone today.  She previously received GI care from Dr. Earlean Shawl prior to his retirement.  Currently I do not have prior procedure records but they have been requested.  She reports that she has been dealing with increasing cough and throat sensitivity now for for 5 months.  She is always prone to a "bronchitis" type cough which tends to be worse in the winter or spring but normally responds to usual treatment.  Usual treatments earlier this year have not been effective.  She notices the cough comes worse at night but can be present throughout the day.  She does not feel that she has increased throat clearing.  Cough can be rarely productive of clearish sputum.  In the past she has used Tussionex at night for cough control.  Sometimes she notices that coughing "fits" tend to be worse with drinking liquids.  She does not have liquid or solid food dysphagia.  She does not have a history of heartburn, pyrosis or indigestion.  She denies abdominal pain.  No nausea or vomiting.  Regular bowel movements.  She notes that the cough typically wakes her up 1 or 2 times per night.  She saw Dr. Wilburn Cornelia with ENT a couple of months ago and reportedly direct laryngoscopy was normal.  She tried Nexium over-the-counter for 1 week and could not tell a difference.  She reports primary care performed blood tests and a chest x-ray which were reportedly normal.  Past Medical History:  Diagnosis Date   Allergy    RHINITIS   Arthritis    COVID-19    Depression    Family history of cancer of female genital organ    Family history of lung cancer    Family history of throat cancer    Glaucoma    Hyperlipidemia     Hypertension    Osteopenia    TMJ syndrome     Past Surgical History:  Procedure Laterality Date   BIOPSY BREAST     BLEPHAROPLASTY     KNEE ARTHROSCOPY     right knee, 1990 and 2012, Dr. Ty Hilts JOINT SURGERY     TONSILLECTOMY     TOTAL KNEE ARTHROPLASTY  08/10/2012   Procedure: TOTAL KNEE ARTHROPLASTY;  Surgeon: Garald Balding, MD;  Location: [facility];  Service: Orthopedics;  Laterality: Right;  Right Total Knee Arthroplasty   TOTAL KNEE ARTHROPLASTY Left 06/14/2019   Procedure: LEFT TOTAL KNEE ARTHROPLASTY;  Surgeon: Garald Balding, MD;  Location: [facility];  Service: Orthopedics;  Laterality: Left;    Outpatient Medications Prior to Visit  Medication Sig Dispense Refill   atorvastatin (LIPITOR) 20 MG tablet Take 1 tablet (20 mg total) by mouth daily. 90 tablet 3   cetirizine (ZYRTEC) 10 MG chewable tablet Chew 10 mg by mouth daily.     chlorpheniramine-HYDROcodone (TUSSIONEX PENNKINETIC ER) 10-8 MG/5ML Take 5 mLs by mouth every 12 (twelve) hours as needed for cough. 115 mL 0   cholecalciferol (VITAMIN D) 1000 units tablet Take 1,000 Units by mouth daily.     cyanocobalamin 1000 MCG tablet Take by mouth.     Multiple Vitamins-Minerals (MULTIVITAMIN WITH MINERALS) tablet Take 1 tablet by mouth daily. Women's Multivitamin  50+     mycophenolate (CELLCEPT) 500 MG tablet      naproxen sodium (ALEVE) 220 MG tablet Take 440 mg by mouth every 8 (eight) hours.     olmesartan-hydrochlorothiazide (BENICAR HCT) 40-12.5 MG tablet TAKE 1 TABLET BY MOUTH DAILY 90 tablet 0   venlafaxine XR (EFFEXOR-XR) 150 MG 24 hr capsule Take 1 capsule (150 mg total) by mouth daily with breakfast. 90 capsule 3   amoxicillin-clavulanate (AUGMENTIN) 875-125 MG tablet Take 1 tablet by mouth 2 (two) times daily. 20 tablet 0   benzonatate (TESSALON) 200 MG capsule Take 1 capsule (200 mg total) by mouth 3 (three) times daily as needed for cough. 30 capsule 0   No facility-administered  medications prior to visit.    No Known Allergies  Family History  Problem Relation Age of Onset   Heart disease Mother        valve replacement   Breast cancer Mother 50   Throat cancer Mother 25       smoker   Heart disease Father        died of brain embolism after hip surgery   Arthritis Father    Arthritis Brother    Breast cancer Maternal Aunt        dx in her 60s   Lung cancer Maternal Aunt        dx in her 50s/60s, non-smoker   Vaginal cancer Maternal Aunt        dx in her 70s   Breast cancer Other        dx in her 80s, maternal first cousin's daughter   Diabetes Neg Hx    Hypertension Neg Hx    Stroke Neg Hx    Colon cancer Neg Hx     Social History   Tobacco Use   Smoking status: Former    Types: Cigarettes    Quit date: 02/10/1980    Years since quitting: 42.4   Smokeless tobacco: Never  Vaping Use   Vaping Use: Never used  Substance Use Topics   Alcohol use: Yes    Alcohol/week: 1.0 standard drink of alcohol    Types: 1 Glasses of wine per week    Comment: daily   Drug use: No    ROS: As per history of present illness, otherwise negative  BP 134/70   Pulse 97   Ht '5\' 3"'$  (1.6 m)   Wt 146 lb 2 oz (66.3 kg)   BMI 25.88 kg/m  Constitutional: Well-developed and well-nourished. No distress. HEENT: Normocephalic and atraumatic. No scleral icterus. Cardiovascular: Normal rate, regular rhythm and intact distal pulses. No M/R/G Pulmonary/chest: Effort normal and breath sounds normal.  Fine bibasilar crackles without wheezing or rhonchi. Abdominal: Soft, nontender, nondistended. Bowel sounds active throughout. There are no masses palpable. No hepatosplenomegaly. Extremities: no clubbing, cyanosis, or edema Neurological: Alert and oriented to person place and time. Skin: Skin is warm and dry.  Psychiatric: Normal mood and affect. Behavior is normal.  RELEVANT LABS AND IMAGING: CBC    Component Value Date/Time   WBC 7.0 05/21/2022 1506   WBC 7.8  06/16/2019 0524   RBC 4.14 05/21/2022 1506   RBC 2.73 (L) 06/16/2019 0524   HGB 12.6 05/21/2022 1506   HCT 36.9 05/21/2022 1506   PLT 279 05/21/2022 1506   MCV 89 05/21/2022 1506   MCH 30.4 05/21/2022 1506   MCH 30.0 06/16/2019 0524   MCHC 34.1 05/21/2022 1506   MCHC 32.4 06/16/2019 0524   RDW 13.5  05/21/2022 1506   LYMPHSABS 1.4 05/21/2022 1506   MONOABS 0.4 06/13/2019 1022   EOSABS 0.1 05/21/2022 1506   BASOSABS 0.0 05/21/2022 1506    CMP     Component Value Date/Time   NA 136 05/21/2022 1506   K 4.2 05/21/2022 1506   CL 96 05/21/2022 1506   CO2 23 05/21/2022 1506   GLUCOSE 100 (H) 05/21/2022 1506   GLUCOSE 140 (H) 06/16/2019 0524   BUN 20 05/21/2022 1506   CREATININE 0.73 05/21/2022 1506   CREATININE 0.58 02/09/2017 0847   CALCIUM 9.6 05/21/2022 1506   PROT 6.8 05/21/2022 1506   ALBUMIN 4.5 05/21/2022 1506   AST 16 05/21/2022 1506   ALT 19 05/21/2022 1506   ALKPHOS 92 05/21/2022 1506   BILITOT <0.2 05/21/2022 1506   GFRNONAA 93 08/21/2020 1350   GFRAA 107 08/21/2020 1350   CLINICAL DATA:  Chronic cough for 2 weeks.   EXAM: CHEST - 2 VIEW   COMPARISON:  June 13, 2019   FINDINGS: The heart size and mediastinal contours are within normal limits. Both lungs are clear. Scoliosis of spine noted.   IMPRESSION: No active cardiopulmonary disease.     Electronically Signed   By: Abelardo Diesel M.D.   On: 05/22/2022 09:20    ASSESSMENT/PLAN: 71 year old female with a history of panuveitis (on CellCept and previously Humira), hypertension, hyperlipidemia, arthritis and history of depression who is seen to evaluate chronic cough.   Chronic cough --the question of whether reflux or LPR could be contributing to her symptoms is certainly raise.  Also wonder if she could have microaspiration related to swallowing liquids triggering her cough symptom.  She does not have traditional heartburn or pyrosis symptom and no upper abdominal pain, nausea or trouble with eating.   We discussed how this may be a reflux related cough and I am not certain that she took PPI long enough to see an effect.  That said were going to objectively assess for reflux and aspiration before considering additional evaluation or PPI trial. --Modified barium swallow and barium esophagram to see if we can elucidate reflux or aspiration --Possible PPI trial and possible pulmonary referral depending on the results of the studies  2.  CRC screening --no family history of colon cancer.  Prior colonoscopies with Dr. Earlean Shawl.  Records been requested to determine when next screening test indicated.   TF:[id number], Elyse Jarvis, [place] [place],  [place] [id number]

## 2022-06-26 NOTE — Patient Instructions (Signed)
You have been scheduled for a modified barium swallow on 07/04/22 at 11:00am. Please arrive 30 minutes prior to your test for registration. You will go to [facility] (Do to construction, please arrive at Entrance C off of [facility].) for your appointment. Should you need to cancel or reschedule your appointment, please contact [phone] Jodi Duffy) or [phone] [place]). _____________________________________________________________________ A Modified Barium Swallow Study, or MBS, is a special x-ray that is taken to check swallowing skills. It is carried out by a Stage manager and a Psychologist, clinical (SLP). During this test, yourmouth, throat, and esophagus, a muscular tube which connects your mouth to your stomach, is checked. The test will help you, your doctor, and the SLP plan what types of foods and liquids are easier for you to swallow. The SLP will also identify positions and ways to help you swallow more easily and safely. What will happen during an MBS? You will be taken to an x-ray room and seated comfortably. You will be asked to swallow small amounts of food and liquid mixed with barium. Barium is a liquid or paste that allows images of your mouth, throat and esophagus to be seen on x-ray. The x-ray captures moving images of the food you are swallowing as it travels from your mouth through your throat and into your esophagus. This test helps identify whether food or liquid is entering your lungs (aspiration). The test also shows which part of your mouth or throat lacks strength or coordination to move the food or liquid in the right direction. This test typically takes 30 minutes to 1 hour to complete. ________________________________________________________________  The [facility] providers would like to encourage you to use [facility] to communicate with providers for non-urgent requests or questions.  Due to long hold times on the telephone, sending your provider  a message by [facility] may be a faster and more efficient way to get a response.  Please allow 48 business hours for a response.  Please remember that this is for non-urgent requests.   Due to recent changes in healthcare laws, you may see the results of your imaging and laboratory studies on MyChart before your provider has had a chance to review them.  We understand that in some cases there may be results that are confusing or concerning to you. Not all laboratory results come back in the same time frame and the provider may be waiting for multiple results in order to interpret others.  Please give [place] 48 hours in order for your provider to thoroughly review all the results before contacting the office for clarification of your results.

## 2022-07-01 DIAGNOSIS — Z961 Presence of intraocular lens: Secondary | ICD-10-CM | POA: Diagnosis not present

## 2022-07-01 DIAGNOSIS — H43821 Vitreomacular adhesion, right eye: Secondary | ICD-10-CM | POA: Diagnosis not present

## 2022-07-01 DIAGNOSIS — Z79899 Other long term (current) drug therapy: Secondary | ICD-10-CM | POA: Diagnosis not present

## 2022-07-01 DIAGNOSIS — H44113 Panuveitis, bilateral: Secondary | ICD-10-CM | POA: Diagnosis not present

## 2022-07-01 DIAGNOSIS — H30033 Focal chorioretinal inflammation, peripheral, bilateral: Secondary | ICD-10-CM | POA: Diagnosis not present

## 2022-07-04 ENCOUNTER — Ambulatory Visit (HOSPITAL_COMMUNITY)
Admission: RE | Admit: 2022-07-04 | Discharge: 2022-07-04 | Disposition: A | Discharge status: Home / Self Care | Payer: Medicare Other | Source: Ambulatory Visit | Attending: Family Medicine | Admitting: Family Medicine

## 2022-07-04 ENCOUNTER — Ambulatory Visit (HOSPITAL_COMMUNITY)
Admission: RE | Admit: 2022-07-04 | Discharge: 2022-07-04 | Disposition: A | Discharge status: Home / Self Care | Payer: Medicare Other | Source: Ambulatory Visit | Attending: Internal Medicine | Admitting: Internal Medicine

## 2022-07-04 DIAGNOSIS — R053 Chronic cough: Secondary | ICD-10-CM

## 2022-07-04 DIAGNOSIS — K449 Diaphragmatic hernia without obstruction or gangrene: Secondary | ICD-10-CM | POA: Diagnosis not present

## 2022-07-04 DIAGNOSIS — K219 Gastro-esophageal reflux disease without esophagitis: Secondary | ICD-10-CM | POA: Insufficient documentation

## 2022-07-04 DIAGNOSIS — R1310 Dysphagia, unspecified: Secondary | ICD-10-CM | POA: Diagnosis not present

## 2022-07-04 DIAGNOSIS — R059 Cough, unspecified: Secondary | ICD-10-CM | POA: Diagnosis not present

## 2022-07-07 ENCOUNTER — Other Ambulatory Visit: Payer: Self-pay

## 2022-07-07 DIAGNOSIS — R053 Chronic cough: Secondary | ICD-10-CM

## 2022-07-23 ENCOUNTER — Encounter: Payer: Self-pay | Admitting: Internal Medicine

## 2022-07-25 DIAGNOSIS — H04123 Dry eye syndrome of bilateral lacrimal glands: Secondary | ICD-10-CM | POA: Diagnosis not present

## 2022-07-25 DIAGNOSIS — H04221 Epiphora due to insufficient drainage, right lacrimal gland: Secondary | ICD-10-CM | POA: Diagnosis not present

## 2022-07-25 DIAGNOSIS — G510 Bell's palsy: Secondary | ICD-10-CM | POA: Diagnosis not present

## 2022-07-25 DIAGNOSIS — Z961 Presence of intraocular lens: Secondary | ICD-10-CM | POA: Diagnosis not present

## 2022-07-25 DIAGNOSIS — H40053 Ocular hypertension, bilateral: Secondary | ICD-10-CM | POA: Diagnosis not present

## 2022-07-27 NOTE — Progress Notes (Unsigned)
Synopsis: Referred for Chronic cough by Pyrtle, Lajuan Lines, MD  Subjective:   PATIENT ID: Jodi Duffy GENDER: female DOB: [birth year], MRN: [id number]  No chief complaint on file.  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 Still had suspicion for LPR despite no response to 1 month trial ppi. MBS WNL. Esophagram with small hiatal hernia.   Seen by ENT 12/27/21 ago with normal FNL but did have purulent sinusitis given augmentin  Otherwise pertinent review of systems is negative.  Past Medical History:  Diagnosis Date   Allergy    RHINITIS   Arthritis    COVID-19    Depression    Family history of cancer of female genital organ    Family history of lung cancer    Family history of throat cancer    Glaucoma    Hyperlipidemia    Hypertension    Osteopenia    TMJ syndrome      Family History  Problem Relation Age of Onset   Heart disease Mother        valve replacement   Breast cancer Mother 8   Throat cancer Mother 57       smoker   Heart disease Father        died of brain embolism after hip surgery   Arthritis Father    Arthritis Brother    Breast cancer Maternal Aunt        dx in her 48s   Lung cancer Maternal Aunt        dx in her 50s/60s, non-smoker   Vaginal cancer Maternal Aunt        dx in her 58s   Breast cancer Other        dx in her 9s, maternal first cousin's daughter   Diabetes Neg Hx    Hypertension Neg Hx    Stroke Neg Hx    Jodi cancer Neg Hx      Past Surgical History:  Procedure Laterality Date   BIOPSY BREAST     BLEPHAROPLASTY     KNEE ARTHROSCOPY     right knee, 1990 and 2012, Dr. Ty Hilts JOINT SURGERY     TONSILLECTOMY     TOTAL KNEE ARTHROPLASTY  08/10/2012   Procedure: TOTAL KNEE ARTHROPLASTY;  Surgeon: Garald Balding, MD;  Location: [facility];  Service: Orthopedics;  Laterality: Right;  Right Total Knee Arthroplasty   TOTAL  KNEE ARTHROPLASTY Left 06/14/2019   Procedure: LEFT TOTAL KNEE ARTHROPLASTY;  Surgeon: Garald Balding, MD;  Location: [facility];  Service: Orthopedics;  Laterality: Left;    Social History   Socioeconomic History   Marital status: Married    Spouse name: Not on file   Number of children: Not on file   Years of education: Not on file   Highest education level: Professional school degree (e.g., MD, DDS, DVM, JD)  Occupational History   Occupation: attorney    Employer: [employer]  Tobacco Use   Smoking status: Former    Types: Cigarettes    Quit date: 02/10/1980    Years since quitting: 42.4   Smokeless tobacco: Never  Vaping Use   Vaping Use: Never used  Substance and Sexual Activity   Alcohol use: Yes    Alcohol/week: 1.0 standard drink of alcohol    Types: 1 Glasses of wine per week    Comment: daily  Drug use: No   Sexual activity: Yes  Other Topics Concern   Not on file  Social History Narrative   Exercises with pilates, golf, and has a trainer   Social Determinants of Health   Financial Resource Strain: Low Risk  (11/22/2021)   Overall Financial Resource Strain (CARDIA)    Difficulty of Paying Living Expenses: Not hard at all  Food Insecurity: No Food Insecurity (11/22/2021)   Hunger Vital Sign    Worried About Running Out of Food in the Last Year: Never true    Ran Out of Food in the Last Year: Never true  Transportation Needs: No Transportation Needs (11/22/2021)   PRAPARE - Hydrologist (Medical): No    Lack of Transportation (Non-Medical): No  Physical Activity: Insufficiently Active (11/22/2021)   Exercise Vital Sign    Days of Exercise per Week: 2 days    Minutes of Exercise per Session: 50 min  Stress: No Stress Concern Present (11/22/2021)   [place]    Feeling of Stress : Not at all  Social Connections: Not on file  Intimate Partner Violence: Not on  file     No Known Allergies   Outpatient Medications Prior to Visit  Medication Sig Dispense Refill   atorvastatin (LIPITOR) 20 MG tablet Take 1 tablet (20 mg total) by mouth daily. 90 tablet 3   cetirizine (ZYRTEC) 10 MG chewable tablet Chew 10 mg by mouth daily.     chlorpheniramine-HYDROcodone (TUSSIONEX PENNKINETIC ER) 10-8 MG/5ML Take 5 mLs by mouth every 12 (twelve) hours as needed for cough. 115 mL 0   cholecalciferol (VITAMIN D) 1000 units tablet Take 1,000 Units by mouth daily.     cyanocobalamin 1000 MCG tablet Take by mouth.     Multiple Vitamins-Minerals (MULTIVITAMIN WITH MINERALS) tablet Take 1 tablet by mouth daily. Women's Multivitamin 50+     mycophenolate (CELLCEPT) 500 MG tablet      naproxen sodium (ALEVE) 220 MG tablet Take 440 mg by mouth every 8 (eight) hours.     olmesartan-hydrochlorothiazide (BENICAR HCT) 40-12.5 MG tablet TAKE 1 TABLET BY MOUTH DAILY 90 tablet 0   venlafaxine XR (EFFEXOR-XR) 150 MG 24 hr capsule Take 1 capsule (150 mg total) by mouth daily with breakfast. 90 capsule 3   No facility-administered medications prior to visit.       Objective:   Physical Exam:  General appearance: 71 y.o., female, NAD, conversant  Eyes: anicteric sclerae; PERRL, tracking appropriately HENT: NCAT; MMM Neck: Trachea midline; no lymphadenopathy, no JVD Lungs: CTAB, no crackles, no wheeze, with normal respiratory effort CV: RRR, no murmur  Abdomen: Soft, non-tender; non-distended, BS present  Extremities: No peripheral edema, warm Skin: Normal turgor and texture; no rash Psych: Appropriate affect Neuro: Alert and oriented to person and place, no focal deficit     There were no vitals filed for this visit.   on *** LPM *** RA BMI Readings from Last 3 Encounters:  06/26/22 25.88 kg/m  06/10/22 25.69 kg/m  05/21/22 25.72 kg/m   Wt Readings from Last 3 Encounters:  06/26/22 146 lb 2 oz (66.3 kg)  06/10/22 145 lb (65.8 kg)  05/21/22 145 lb 3.2 oz  (65.9 kg)     CBC    Component Value Date/Time   WBC 7.0 05/21/2022 1506   WBC 7.8 06/16/2019 0524   RBC 4.14 05/21/2022 1506   RBC 2.73 (L) 06/16/2019 0524   HGB 12.6  05/21/2022 1506   HCT 36.9 05/21/2022 1506   PLT 279 05/21/2022 1506   MCV 89 05/21/2022 1506   MCH 30.4 05/21/2022 1506   MCH 30.0 06/16/2019 0524   MCHC 34.1 05/21/2022 1506   MCHC 32.4 06/16/2019 0524   RDW 13.5 05/21/2022 1506   LYMPHSABS 1.4 05/21/2022 1506   MONOABS 0.4 06/13/2019 1022   EOSABS 0.1 05/21/2022 1506   BASOSABS 0.0 05/21/2022 1506    ***  Chest Imaging: ***  Pulmonary Functions Testing Results:     No data to display          FeNO: ***  Pathology: ***  Echocardiogram: ***  Heart Catheterization: ***    Assessment & Plan:    Plan:      Maryjane Hurter, MD [facility] 07/27/2022 2:12 PM

## 2022-07-29 ENCOUNTER — Ambulatory Visit (INDEPENDENT_AMBULATORY_CARE_PROVIDER_SITE_OTHER): Payer: Medicare Other | Admitting: Student

## 2022-07-29 ENCOUNTER — Encounter: Payer: Self-pay | Admitting: Student

## 2022-07-29 VITALS — BP 122/76 | HR 76 | Temp 98.5°F | Ht 63.0 in | Wt 145.0 lb

## 2022-07-29 DIAGNOSIS — R053 Chronic cough: Secondary | ICD-10-CM | POA: Diagnosis not present

## 2022-07-29 MED ORDER — PANTOPRAZOLE SODIUM 40 MG PO TBEC: 40.0000 mg | DELAYED_RELEASE_TABLET | Freq: Every day | ORAL | 0 refills | Status: DC

## 2022-07-29 NOTE — Patient Instructions (Addendum)
- try protonix 40 mg 30 minutes before dinner nightly - see conservative measures below for reflux and throat clearing  - PFTs we will schedule and I'll see you in clinic in 8 weeks!  You may be a chronic throat clearer! You are not alone! The causes of chronic throat clearing include acid reflux (laryngopharyngeal reflux), allergies, environmental irritants such as tobacco smoke and air pollution, and asthma. If present for a long time throat clearing can become habit forming. When you clear your throat, you are transferring mucus from your throat up into your mouth and nose. We all secrete up to 2 liters (imagine a big Coke bottle) of mucus a day. This saliva is usually swallowed and ends up in the toilet eventually. By clearing the mucus back into your mouth and nose you are sending the saliva in the wrong direction. This is counterproductive. Unless you are walking around spitting all day (which most throat clearers do not do), the mucus will work its way back down to the throat and eventually be swallowed. Get the mucus going in the right direction. Swallow! Swallow! Swallow! No throat clearing.  Chronic throat clearing is damaging. The trauma from the throat clearing can cause redness and swelling of your vocal cords. If the clearing is very excessive small growths (granulomas) can form. These granulomas can get so large that they can eventually affect your breathing. Surgical removal may be necessary. The irritation and swelling produced by the clearing can cause saliva to sit in your throat. This causes more throat clearing. More throat clearing causes more stagnant mucus which causes more throat clearing, which causes more mucus, etc. A vicious cycle will ensue and the habit can be very difficult to break. Without your help and a conscious effort on your part to break the cycle, the throat clearing will never stop.  Your doctor may prescribe medication and behavioral modifications to treat acid  reflux disease. Nose and throat sprays may be prescribed to treat underlying allergies or asthma. Avoiding possible irritants will be recommended. Without changes to your behavior these treatments will not be successful. The following alterations are recommended:  Do not clear your throat. Swallow instead. This gets the mucus going in the right direction towards the toilet.  Carry around some water to assist with swallowing and mucus clearance. When you feel the urge to clear your throat take a sip of the water. If you absolutely need to clear your throat perform a non-traumatic throat clear. To do this pant with your mouth open and say "[place], [place], [place]" with a powerful but very breathy voice. This will clear the secretions without causing damage. Increase your water intake. This will thin secretions and make it easier to swallow. Comply with the behavior recommendations for reflux disease. Chew baking soda gum. This can be found on the internet or in the tooth paste isle of your pharmacy. Gum chewing can help with swallowing, reflux, and throat clearing. Chew three pieces a day. If you develop jaw discomfort or headaches decrease the amount of gum chewing. Tell your friends and family to tell you to swallow when you clear your throat. Some people have been clearing so long that they don't even know when they are doing it. Be patient. The urge to clear your throat will not go away overnight. It may take 8 or 12 weeks for the medication and behavior modifications to work.    Gastroesophageal Reflux Disease, Adult Gastroesophageal reflux (GER) happens when acid from the stomach  flows up into the tube that connects the mouth and the stomach (esophagus). Normally, food travels down the esophagus and stays in the stomach to be digested. However, when a person has GER, food and stomach acid sometimes move back up into the esophagus. If this becomes a more serious problem, the person may be diagnosed with a  disease called gastroesophageal reflux disease (GERD). GERD occurs when the reflux: Happens often. Causes frequent or severe symptoms. Causes problems such as damage to the esophagus. When stomach acid comes in contact with the esophagus, the acid may cause inflammation in the esophagus. Over time, GERD may create small holes (ulcers) in the lining of the esophagus. What are the causes? This condition is caused by a problem with the muscle between the esophagus and the stomach (lower esophageal sphincter, or LES). Normally, the LES muscle closes after food passes through the esophagus to the stomach. When the LES is weakened or abnormal, it does not close properly, and that allows food and stomach acid to go back up into the esophagus. The LES can be weakened by certain dietary substances, medicines, and medical conditions, including: Tobacco use. Pregnancy. Having a hiatal hernia. Alcohol use. Certain foods and beverages, such as coffee, chocolate, onions, and peppermint. What increases the risk? You are more likely to develop this condition if you: Have an increased body weight. Have a connective tissue disorder. Take NSAIDs, such as ibuprofen. What are the signs or symptoms? Symptoms of this condition include: Heartburn. Difficult or painful swallowing and the feeling of having a lump in the throat. A bitter taste in the mouth. Bad breath and having a large amount of saliva. Having an upset or bloated stomach and belching. Chest pain. Different conditions can cause chest pain. Make sure you see your health care provider if you experience chest pain. Shortness of breath or wheezing. Ongoing (chronic) cough or a nighttime cough. Wearing away of tooth enamel. Weight loss. How is this diagnosed? This condition may be diagnosed based on a medical history and a physical exam. To determine if you have mild or severe GERD, your health care provider may also monitor how you respond to  treatment. You may also have tests, including: A test to examine your stomach and esophagus with a small camera (endoscopy). A test that measures the acidity level in your esophagus. A test that measures how much pressure is on your esophagus. A barium swallow or modified barium swallow test to show the shape, size, and functioning of your esophagus. How is this treated? Treatment for this condition may vary depending on how severe your symptoms are. Your health care provider may recommend: Changes to your diet. Medicine. Surgery. The goal of treatment is to help relieve your symptoms and to prevent complications. Follow these instructions at home: Eating and drinking  Follow a diet as recommended by your health care provider. This may involve avoiding foods and drinks such as: Coffee and tea, with or without caffeine. Drinks that contain alcohol. Energy drinks and sports drinks. Carbonated drinks or sodas. Chocolate and cocoa. Peppermint and mint flavorings. Garlic and onions. Horseradish. Spicy and acidic foods, including peppers, chili powder, curry powder, vinegar, hot sauces, and barbecue sauce. Citrus fruit juices and citrus fruits, such as oranges, lemons, and limes. Tomato-based foods, such as red sauce, chili, salsa, and pizza with red sauce. Fried and fatty foods, such as donuts, french fries, potato chips, and high-fat dressings. High-fat meats, such as hot dogs and fatty cuts of red and  white meats, such as rib eye steak, sausage, ham, and bacon. High-fat dairy items, such as whole milk, butter, and cream cheese. Eat small, frequent meals instead of large meals. Avoid drinking large amounts of liquid with your meals. Avoid eating meals during the 2-3 hours before bedtime. Avoid lying down right after you eat. Do not exercise right after you eat. Lifestyle  Do not use any products that contain nicotine or tobacco. These products include cigarettes, chewing tobacco, and  vaping devices, such as e-cigarettes. If you need help quitting, ask your health care provider. Try to reduce your stress by using methods such as yoga or meditation. If you need help reducing stress, ask your health care provider. If you are overweight, reduce your weight to an amount that is healthy for you. Ask your health care provider for guidance about a safe weight loss goal. General instructions Pay attention to any changes in your symptoms. Take over-the-counter and prescription medicines only as told by your health care provider. Do not take aspirin, ibuprofen, or other NSAIDs unless your health care provider told you to take these medicines. Wear loose-fitting clothing. Do not wear anything tight around your waist that causes pressure on your abdomen. Raise (elevate) the head of your bed about 6 inches (15 cm). You can use a wedge to do this. Avoid bending over if this makes your symptoms worse. Keep all follow-up visits. This is important. Contact a health care provider if: You have: New symptoms. Unexplained weight loss. Difficulty swallowing or it hurts to swallow. Wheezing or a persistent cough. A hoarse voice. Your symptoms do not improve with treatment. Get help right away if: You have sudden pain in your arms, neck, jaw, teeth, or back. You suddenly feel sweaty, dizzy, or light-headed. You have chest pain or shortness of breath. You vomit and the vomit is green, yellow, or black, or it looks like blood or coffee grounds. You faint. You have stool that is red, bloody, or black. You cannot swallow, drink, or eat. These symptoms may represent a serious problem that is an emergency. Do not wait to see if the symptoms will go away. Get medical help right away. Call your local emergency services (911 in the [place] Do not drive yourself to the hospital. Summary Gastroesophageal reflux happens when acid from the stomach flows up into the esophagus. GERD is a disease in which the  reflux happens often, causes frequent or severe symptoms, or causes problems such as damage to the esophagus. Treatment for this condition may vary depending on how severe your symptoms are. Your health care provider may recommend diet and lifestyle changes, medicine, or surgery. Contact a health care provider if you have new or worsening symptoms. Take over-the-counter and prescription medicines only as told by your health care provider. Do not take aspirin, ibuprofen, or other NSAIDs unless your health care provider told you to do so. Keep all follow-up visits as told by your health care provider. This is important. This information is not intended to replace advice given to you by your health care provider. Make sure you discuss any questions you have with your health care provider. Document Revised: 05/14/2020 Document Reviewed: 05/14/2020 [employer] Patient Education  [employer].

## 2022-08-12 DIAGNOSIS — Z01419 Encounter for gynecological examination (general) (routine) without abnormal findings: Secondary | ICD-10-CM | POA: Diagnosis not present

## 2022-08-14 DIAGNOSIS — Z1231 Encounter for screening mammogram for malignant neoplasm of breast: Secondary | ICD-10-CM | POA: Diagnosis not present

## 2022-08-14 LAB — HM MAMMOGRAPHY

## 2022-08-19 ENCOUNTER — Encounter: Payer: Self-pay | Admitting: Family Medicine

## 2022-08-21 DIAGNOSIS — Z23 Encounter for immunization: Secondary | ICD-10-CM | POA: Diagnosis not present

## 2022-08-26 ENCOUNTER — Encounter: Payer: Self-pay | Admitting: Internal Medicine

## 2022-09-08 ENCOUNTER — Encounter: Payer: Self-pay | Admitting: Internal Medicine

## 2022-09-18 DIAGNOSIS — L249 Irritant contact dermatitis, unspecified cause: Secondary | ICD-10-CM | POA: Diagnosis not present

## 2022-09-30 NOTE — Progress Notes (Unsigned)
Synopsis: Referred for Chronic cough by Denita Lung, MD  Subjective:   PATIENT ID: Jodi Duffy GENDER: female DOB: 11-19-1950, MRN: [id number]  No chief complaint on file.  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 Still had suspicion for LPR despite no response to 1 month trial ppi. MBS WNL. Esophagram with small hiatal hernia.   Seen by ENT 12/27/21 ago with normal FNL but did have purulent sinusitis given augmentin  She says she's had this cough for at least 4-5 months. Happens sometimes when she's eating or sometimes when she inhales through her mouth. Does have heightened throat sensitivity. At ngiht sometimes coughs up clearish sputum, otherwise dry. No heartburn or reflux. Some sinus congestion but not especially bothered by it and no postnasal drainage. She has not tried an inhaler. Has not tried steroids.   Tried nexium for a month over the counter without improvement, doesn't recall dose used.   No history of asthma, eczema  Regarding her panuveitis she does not follow with rheumatology, sees several ophthalmologists, and RF/CCP neg, no other AI serologies. She has joint pain but no morning stiffness, raynaud's, oral/nasal ulcers, rashes.   She has no family history of lung disease  She is an Forensic psychologist. She smoked 10 years 1ppd 40-50 years ago. No MJ, vaping. She has a dog. She grew up in [place] but otherwise has lived in [place]. Went to [place] a month or two ago, to [place] and then to [place].   Interval HPI  Started on ppi last visit  PFTs  Otherwise pertinent review of systems is negative.  Past Medical History:  Diagnosis Date   Allergy    RHINITIS   Arthritis    COVID-19    Depression    Family history of cancer of female genital organ    Family history of lung cancer    Family history of throat cancer    Glaucoma    Hyperlipidemia     Hypertension    Osteopenia    TMJ syndrome      Family History  Problem Relation Age of Onset   Heart disease Mother        valve replacement   Breast cancer Mother 21   Throat cancer Mother 54       smoker   Heart disease Father        died of brain embolism after hip surgery   Arthritis Father    Arthritis Brother    Breast cancer Maternal Aunt        dx in her 96s   Lung cancer Maternal Aunt        dx in her 50s/60s, non-smoker   Vaginal cancer Maternal Aunt        dx in her 68s   Breast cancer Other        dx in her 58s, maternal first cousin's daughter   Diabetes Neg Hx    Hypertension Neg Hx    Stroke Neg Hx    Jodi cancer Neg Hx      Past Surgical History:  Procedure Laterality Date   BIOPSY BREAST     BLEPHAROPLASTY     KNEE ARTHROSCOPY     right knee, 1990 and 2012, Dr. Ty Hilts JOINT SURGERY     TONSILLECTOMY     TOTAL KNEE ARTHROPLASTY  08/10/2012   Procedure: TOTAL KNEE ARTHROPLASTY;  Surgeon: Garald Balding, MD;  Location: [facility];  Service: Orthopedics;  Laterality: Right;  Right Total Knee Arthroplasty   TOTAL KNEE ARTHROPLASTY Left 06/14/2019   Procedure: LEFT TOTAL KNEE ARTHROPLASTY;  Surgeon: Garald Balding, MD;  Location: [facility];  Service: Orthopedics;  Laterality: Left;    Social History   Socioeconomic History   Marital status: Married    Spouse name: Not on file   Number of children: Not on file   Years of education: Not on file   Highest education level: Professional school degree (e.g., MD, DDS, DVM, JD)  Occupational History   Occupation: attorney    Employer: [employer]  Tobacco Use   Smoking status: Former    Packs/day: 1.00    Years: 12.00    Total pack years: 12.00    Types: Cigarettes    Quit date: 02/10/1980    Years since quitting: 42.6   Smokeless tobacco: Never  Vaping Use   Vaping Use: Never used  Substance and Sexual Activity   Alcohol use: Yes    Alcohol/week: 1.0 standard  drink of alcohol    Types: 1 Glasses of wine per week    Comment: daily   Drug use: No   Sexual activity: Yes  Other Topics Concern   Not on file  Social History Narrative   Exercises with pilates, golf, and has a trainer   Social Determinants of Health   Financial Resource Strain: Low Risk  (11/22/2021)   Overall Financial Resource Strain (CARDIA)    Difficulty of Paying Living Expenses: Not hard at all  Food Insecurity: No Food Insecurity (11/22/2021)   Hunger Vital Sign    Worried About Running Out of Food in the Last Year: Never true    Ran Out of Food in the Last Year: Never true  Transportation Needs: No Transportation Needs (11/22/2021)   PRAPARE - Hydrologist (Medical): No    Lack of Transportation (Non-Medical): No  Physical Activity: Insufficiently Active (11/22/2021)   Exercise Vital Sign    Days of Exercise per Week: 2 days    Minutes of Exercise per Session: 50 min  Stress: No Stress Concern Present (11/22/2021)   [place]    Feeling of Stress : Not at all  Social Connections: Not on file  Intimate Partner Violence: Not on file     No Known Allergies   Outpatient Medications Prior to Visit  Medication Sig Dispense Refill   atorvastatin (LIPITOR) 20 MG tablet Take 1 tablet (20 mg total) by mouth daily. 90 tablet 3   cetirizine (ZYRTEC) 10 MG chewable tablet Chew 10 mg by mouth daily as needed.     cholecalciferol (VITAMIN D) 1000 units tablet Take 1,000 Units by mouth daily.     cyanocobalamin 1000 MCG tablet Take by mouth.     Multiple Vitamins-Minerals (MULTIVITAMIN WITH MINERALS) tablet Take 1 tablet by mouth daily. Women's Multivitamin 50+     mycophenolate (CELLCEPT) 500 MG tablet      naproxen sodium (ALEVE) 220 MG tablet Take 440 mg by mouth every 8 (eight) hours.     olmesartan-hydrochlorothiazide (BENICAR HCT) 40-12.5 MG tablet TAKE 1 TABLET BY MOUTH DAILY 90 tablet  0   pantoprazole (PROTONIX) 40 MG tablet Take 1 tablet (40 mg total) by mouth daily. 90 tablet 0   venlafaxine XR (EFFEXOR-XR) 150 MG 24 hr capsule Take 1 capsule (150 mg total) by  mouth daily with breakfast. 90 capsule 3   No facility-administered medications prior to visit.       Objective:   Physical Exam:  General appearance: 70 y.o., female, NAD, conversant  Eyes: anicteric sclerae; PERRL, tracking appropriately HENT: NCAT; MMM Neck: Trachea midline; no lymphadenopathy, no JVD Lungs: CTAB, no crackles, no wheeze, with normal respiratory effort CV: RRR, no murmur  Abdomen: Soft, non-tender; non-distended, BS present  Extremities: No peripheral edema, warm Skin: Normal turgor and texture; no rash Psych: Appropriate affect Neuro: Alert and oriented to person and place, no focal deficit     There were no vitals filed for this visit.    on RA BMI Readings from Last 3 Encounters:  07/29/22 25.69 kg/m  06/26/22 25.88 kg/m  06/10/22 25.69 kg/m   Wt Readings from Last 3 Encounters:  07/29/22 145 lb (65.8 kg)  06/26/22 146 lb 2 oz (66.3 kg)  06/10/22 145 lb (65.8 kg)     CBC    Component Value Date/Time   WBC 7.0 05/21/2022 1506   WBC 7.8 06/16/2019 0524   RBC 4.14 05/21/2022 1506   RBC 2.73 (L) 06/16/2019 0524   HGB 12.6 05/21/2022 1506   HCT 36.9 05/21/2022 1506   PLT 279 05/21/2022 1506   MCV 89 05/21/2022 1506   MCH 30.4 05/21/2022 1506   MCH 30.0 06/16/2019 0524   MCHC 34.1 05/21/2022 1506   MCHC 32.4 06/16/2019 0524   RDW 13.5 05/21/2022 1506   LYMPHSABS 1.4 05/21/2022 1506   MONOABS 0.4 06/13/2019 1022   EOSABS 0.1 05/21/2022 1506   BASOSABS 0.0 05/21/2022 1506    Chest Imaging: CXR 05/22/22 reviewed by me unremarkable  Pulmonary Functions Testing Results:     No data to display          \ Assessment & Plan:   # Chronic cough LPR/reflux/irritable larynx remains possible, has only had brief course of Nexium. Panuveitis raises question  of systemic autoimmune process, possibly with pulmonary involvement but she has no other suggestive extraocular sx and CXR WNL. Otherwise consider COPD but smoking hx relatively limited, cough-variant asthma.  Plan: - try protonix 40 mg 30 minutes before dinner nightly - declines trial of albuterol inhaler - see conservative measures below for reflux and throat clearing  - PFTs we will schedule and I'll see you in clinic in 8 weeks!     Maryjane Hurter, MD [facility] 09/30/2022 4:56 PM

## 2022-10-01 ENCOUNTER — Ambulatory Visit (INDEPENDENT_AMBULATORY_CARE_PROVIDER_SITE_OTHER): Payer: Medicare Other | Admitting: Student

## 2022-10-01 ENCOUNTER — Encounter: Payer: Self-pay | Admitting: Student

## 2022-10-01 VITALS — BP 140/90 | HR 92 | Temp 98.3°F | Ht 63.0 in | Wt 146.6 lb

## 2022-10-01 DIAGNOSIS — R053 Chronic cough: Secondary | ICD-10-CM

## 2022-10-01 LAB — PULMONARY FUNCTION TEST
DL/VA % pred: 116 %
DL/VA: 4.82 ml/min/mmHg/L
DLCO cor % pred: 86 %
DLCO cor: 16.18 ml/min/mmHg
DLCO unc % pred: 86 %
DLCO unc: 16.18 ml/min/mmHg
FEF 25-75 Post: 1.83 L/sec
FEF 25-75 Pre: 1.77 L/sec
FEF2575-%Change-Post: 3 %
FEF2575-%Pred-Post: 107 %
FEF2575-%Pred-Pre: 104 %
FEV1-%Change-Post: 2 %
FEV1-%Pred-Post: 88 %
FEV1-%Pred-Pre: 85 %
FEV1-Post: 1.84 L
FEV1-Pre: 1.79 L
FEV1FVC-%Change-Post: -2 %
FEV1FVC-%Pred-Pre: 108 %
FEV6-%Change-Post: 0 %
FEV6-%Pred-Post: 82 %
FEV6-%Pred-Pre: 81 %
FEV6-Post: 2.16 L
FEV6-Pre: 2.14 L
FEV6FVC-%Pred-Post: 105 %
FEV6FVC-%Pred-Pre: 105 %
FVC-%Change-Post: 4 %
FVC-%Pred-Post: 83 %
FVC-%Pred-Pre: 79 %
FVC-Post: 2.3 L
FVC-Pre: 2.19 L
Post FEV1/FVC ratio: 80 %
Post FEV6/FVC ratio: 100 %
Pre FEV1/FVC ratio: 82 %
Pre FEV6/FVC Ratio: 100 %
RV % pred: 105 %
RV: 2.31 L
TLC % pred: 92 %
TLC: 4.51 L

## 2022-10-01 NOTE — Progress Notes (Signed)
Full PFT completed today ? ?

## 2022-10-01 NOTE — Patient Instructions (Signed)
- let us know if you develop productive cough, fever, unintentional weight loss, night sweats drenching the sheets - may need to consider getting CT Chest to look for infection - see conservative measures below for reflux and throat clearing    You may be a chronic throat clearer! You are not alone! The causes of chronic throat clearing include acid reflux (laryngopharyngeal reflux), allergies, environmental irritants such as tobacco smoke and air pollution, and asthma. If present for a long time throat clearing can become habit forming. When you clear your throat, you are transferring mucus from your throat up into your mouth and nose. We all secrete up to 2 liters (imagine a big Coke bottle) of mucus a day. This saliva is usually swallowed and ends up in the toilet eventually. By clearing the mucus back into your mouth and nose you are sending the saliva in the wrong direction. This is counterproductive. Unless you are walking around spitting all day (which most throat clearers do not do), the mucus will work its way back down to the throat and eventually be swallowed. Get the mucus going in the right direction. Swallow! Swallow! Swallow! No throat clearing.  Chronic throat clearing is damaging. The trauma from the throat clearing can cause redness and swelling of your vocal cords. If the clearing is very excessive small growths (granulomas) can form. These granulomas can get so large that they can eventually affect your breathing. Surgical removal may be necessary. The irritation and swelling produced by the clearing can cause saliva to sit in your throat. This causes more throat clearing. More throat clearing causes more stagnant mucus which causes more throat clearing, which causes more mucus, etc. A vicious cycle will ensue and the habit can be very difficult to break. Without your help and a conscious effort on your part to break the cycle, the throat clearing will never stop.  Your doctor may  prescribe medication and behavioral modifications to treat acid reflux disease. Nose and throat sprays may be prescribed to treat underlying allergies or asthma. Avoiding possible irritants will be recommended. Without changes to your behavior these treatments will not be successful. The following alterations are recommended:  Do not clear your throat. Swallow instead. This gets the mucus going in the right direction towards the toilet.  Carry around some water to assist with swallowing and mucus clearance. When you feel the urge to clear your throat take a sip of the water. If you absolutely need to clear your throat perform a non-traumatic throat clear. To do this pant with your mouth open and say "[place], [place], [place]" with a powerful but very breathy voice. This will clear the secretions without causing damage. Increase your water intake. This will thin secretions and make it easier to swallow. Comply with the behavior recommendations for reflux disease. Chew baking soda gum. This can be found on the internet or in the tooth paste isle of your pharmacy. Gum chewing can help with swallowing, reflux, and throat clearing. Chew three pieces a day. If you develop jaw discomfort or headaches decrease the amount of gum chewing. Tell your friends and family to tell you to swallow when you clear your throat. Some people have been clearing so long that they don't even know when they are doing it. Be patient. The urge to clear your throat will not go away overnight. It may take 8 or 12 weeks for the medication and behavior modifications to work.    Gastroesophageal Reflux Disease, Adult Gastroesophageal reflux (  GER) happens when acid from the stomach flows up into the tube that connects the mouth and the stomach (esophagus). Normally, food travels down the esophagus and stays in the stomach to be digested. However, when a person has GER, food and stomach acid sometimes move back up into the esophagus. If this  becomes a more serious problem, the person may be diagnosed with a disease called gastroesophageal reflux disease (GERD). GERD occurs when the reflux: Happens often. Causes frequent or severe symptoms. Causes problems such as damage to the esophagus. When stomach acid comes in contact with the esophagus, the acid may cause inflammation in the esophagus. Over time, GERD may create small holes (ulcers) in the lining of the esophagus. What are the causes? This condition is caused by a problem with the muscle between the esophagus and the stomach (lower esophageal sphincter, or LES). Normally, the LES muscle closes after food passes through the esophagus to the stomach. When the LES is weakened or abnormal, it does not close properly, and that allows food and stomach acid to go back up into the esophagus. The LES can be weakened by certain dietary substances, medicines, and medical conditions, including: Tobacco use. Pregnancy. Having a hiatal hernia. Alcohol use. Certain foods and beverages, such as coffee, chocolate, onions, and peppermint. What increases the risk? You are more likely to develop this condition if you: Have an increased body weight. Have a connective tissue disorder. Take NSAIDs, such as ibuprofen. What are the signs or symptoms? Symptoms of this condition include: Heartburn. Difficult or painful swallowing and the feeling of having a lump in the throat. A bitter taste in the mouth. Bad breath and having a large amount of saliva. Having an upset or bloated stomach and belching. Chest pain. Different conditions can cause chest pain. Make sure you see your health care provider if you experience chest pain. Shortness of breath or wheezing. Ongoing (chronic) cough or a nighttime cough. Wearing away of tooth enamel. Weight loss. How is this diagnosed? This condition may be diagnosed based on a medical history and a physical exam. To determine if you have mild or severe GERD,  your health care provider may also monitor how you respond to treatment. You may also have tests, including: A test to examine your stomach and esophagus with a small camera (endoscopy). A test that measures the acidity level in your esophagus. A test that measures how much pressure is on your esophagus. A barium swallow or modified barium swallow test to show the shape, size, and functioning of your esophagus. How is this treated? Treatment for this condition may vary depending on how severe your symptoms are. Your health care provider may recommend: Changes to your diet. Medicine. Surgery. The goal of treatment is to help relieve your symptoms and to prevent complications. Follow these instructions at home: Eating and drinking  Follow a diet as recommended by your health care provider. This may involve avoiding foods and drinks such as: Coffee and tea, with or without caffeine. Drinks that contain alcohol. Energy drinks and sports drinks. Carbonated drinks or sodas. Chocolate and cocoa. Peppermint and mint flavorings. Garlic and onions. Horseradish. Spicy and acidic foods, including peppers, chili powder, curry powder, vinegar, hot sauces, and barbecue sauce. Citrus fruit juices and citrus fruits, such as oranges, lemons, and limes. Tomato-based foods, such as red sauce, chili, salsa, and pizza with red sauce. Fried and fatty foods, such as donuts, french fries, potato chips, and high-fat dressings. High-fat meats, such as hot  dogs and fatty cuts of red and white meats, such as rib eye steak, sausage, ham, and bacon. High-fat dairy items, such as whole milk, butter, and cream cheese. Eat small, frequent meals instead of large meals. Avoid drinking large amounts of liquid with your meals. Avoid eating meals during the 2-3 hours before bedtime. Avoid lying down right after you eat. Do not exercise right after you eat. Lifestyle  Do not use any products that contain nicotine or  tobacco. These products include cigarettes, chewing tobacco, and vaping devices, such as e-cigarettes. If you need help quitting, ask your health care provider. Try to reduce your stress by using methods such as yoga or meditation. If you need help reducing stress, ask your health care provider. If you are overweight, reduce your weight to an amount that is healthy for you. Ask your health care provider for guidance about a safe weight loss goal. General instructions Pay attention to any changes in your symptoms. Take over-the-counter and prescription medicines only as told by your health care provider. Do not take aspirin, ibuprofen, or other NSAIDs unless your health care provider told you to take these medicines. Wear loose-fitting clothing. Do not wear anything tight around your waist that causes pressure on your abdomen. Raise (elevate) the head of your bed about 6 inches (15 cm). You can use a wedge to do this. Avoid bending over if this makes your symptoms worse. Keep all follow-up visits. This is important. Contact a health care provider if: You have: New symptoms. Unexplained weight loss. Difficulty swallowing or it hurts to swallow. Wheezing or a persistent cough. A hoarse voice. Your symptoms do not improve with treatment. Get help right away if: You have sudden pain in your arms, neck, jaw, teeth, or back. You suddenly feel sweaty, dizzy, or light-headed. You have chest pain or shortness of breath. You vomit and the vomit is green, yellow, or black, or it looks like blood or coffee grounds. You faint. You have stool that is red, bloody, or black. You cannot swallow, drink, or eat. These symptoms may represent a serious problem that is an emergency. Do not wait to see if the symptoms will go away. Get medical help right away. Call your local emergency services (911 in the [place] Do not drive yourself to the hospital. Summary Gastroesophageal reflux happens when acid from the  stomach flows up into the esophagus. GERD is a disease in which the reflux happens often, causes frequent or severe symptoms, or causes problems such as damage to the esophagus. Treatment for this condition may vary depending on how severe your symptoms are. Your health care provider may recommend diet and lifestyle changes, medicine, or surgery. Contact a health care provider if you have new or worsening symptoms. Take over-the-counter and prescription medicines only as told by your health care provider. Do not take aspirin, ibuprofen, or other NSAIDs unless your health care provider told you to do so. Keep all follow-up visits as told by your health care provider. This is important. This information is not intended to replace advice given to you by your health care provider. Make sure you discuss any questions you have with your health care provider. Document Revised: 05/14/2020 Document Reviewed: 05/14/2020 Elsevier Patient Education  [employer].

## 2022-10-11 ENCOUNTER — Other Ambulatory Visit: Payer: Self-pay | Admitting: Student

## 2022-10-15 DIAGNOSIS — L814 Other melanin hyperpigmentation: Secondary | ICD-10-CM | POA: Diagnosis not present

## 2022-10-15 DIAGNOSIS — L821 Other seborrheic keratosis: Secondary | ICD-10-CM | POA: Diagnosis not present

## 2022-10-15 DIAGNOSIS — D225 Melanocytic nevi of trunk: Secondary | ICD-10-CM | POA: Diagnosis not present

## 2022-10-17 DIAGNOSIS — Z23 Encounter for immunization: Secondary | ICD-10-CM | POA: Diagnosis not present

## 2022-10-31 ENCOUNTER — Telehealth: Payer: Self-pay | Admitting: Family Medicine

## 2022-10-31 NOTE — Telephone Encounter (Signed)
Left message for patient to call back and schedule Medicare Annual Wellness Visit (AWV) either virtually or in office. Left  my Jodi Duffy number [phone]   Last AWV  11/22/21 please schedule with Nurse Health Adviser   45 min for awv-i and in office appointments 30 min for awv-s  phone/virtual appointments

## 2022-11-03 DIAGNOSIS — L249 Irritant contact dermatitis, unspecified cause: Secondary | ICD-10-CM | POA: Diagnosis not present

## 2022-11-25 ENCOUNTER — Ambulatory Visit (INDEPENDENT_AMBULATORY_CARE_PROVIDER_SITE_OTHER): Payer: Medicare Other

## 2022-11-25 VITALS — Ht 63.0 in | Wt 148.0 lb

## 2022-11-25 DIAGNOSIS — Z0000 Encounter for general adult medical examination without abnormal findings: Secondary | ICD-10-CM | POA: Diagnosis not present

## 2022-11-25 NOTE — Progress Notes (Signed)
I connected with Jodi Duffy today by telephone and verified that I am speaking with the correct person using two identifiers. Location patient: home Location provider: work Persons participating in the virtual visit: Jodi Duffy, [place] LPN.   I discussed the limitations, risks, security and privacy concerns of performing an evaluation and management service by telephone and the availability of in person appointments. I also discussed with the patient that there may be a patient responsible charge related to this service. The patient expressed understanding and verbally consented to this telephonic visit.    Interactive audio and video telecommunications were attempted between this provider and patient, however failed, due to patient having technical difficulties OR patient did not have access to video capability.  We continued and completed visit with audio only.     Vital signs may be patient reported or missing.  Subjective:   Jodi Duffy is a 71 y.o. female who presents for Medicare Annual (Subsequent) preventive examination.  Review of Systems     Cardiac Risk Factors include: advanced age (>30mn, >>54women);dyslipidemia;hypertension     Objective:    Today's Vitals   11/25/22 1028  Weight: 148 lb (67.1 kg)  Height: '5\' 3"'$  (1.6 m)   Body mass index is 26.22 kg/m.     11/25/2022   10:32 AM 11/22/2021    8:15 AM 08/21/2020   11:41 AM 08/02/2019   10:07 AM 06/14/2019   11:30 AM 06/13/2019    9:30 AM 05/16/2019    8:43 AM  Advanced Directives  Does Patient Have a Medical Advance Directive? Yes Yes Yes Yes Yes Yes Yes  Type of AParamedicof AMo[place]fieldLiving will HN[place]Living will Healthcare Power of [place]at[place]Living will H[place]Living will HWalkerLiving will Living will;Healthcare Power of Attorney  Does patient want to make changes to medical  advance directive?   No - Patient declined No - Patient declined No - Patient declined No - Patient declined   Copy of HLos [place] Chart? Yes - validated most recent copy scanned in chart (See row information) Yes - validated most recent copy scanned in chart (See row information) Yes - validated most recent copy scanned in chart (See row information)  No - copy requested No - copy requested     Current Medications (verified) Outpatient Encounter Medications as of 11/25/2022  Medication Sig   atorvastatin (LIPITOR) 20 MG tablet Take 1 tablet (20 mg total) by mouth daily.   cetirizine (ZYRTEC) 10 MG chewable tablet Chew 10 mg by mouth daily as needed.   cholecalciferol (VITAMIN D) 1000 units tablet Take 1,000 Units by mouth daily.   cyanocobalamin 1000 MCG tablet Take by mouth.   Multiple Vitamins-Minerals (MULTIVITAMIN WITH MINERALS) tablet Take 1 tablet by mouth daily. Women's Multivitamin 50+   mycophenolate (CELLCEPT) 500 MG tablet    naproxen sodium (ALEVE) 220 MG tablet Take 440 mg by mouth every 8 (eight) hours.   olmesartan-hydrochlorothiazide (BENICAR HCT) 40-12.5 MG tablet TAKE 1 TABLET BY MOUTH DAILY   pantoprazole (PROTONIX) 40 MG tablet TAKE 1 TABLET(40 MG) BY MOUTH DAILY   venlafaxine XR (EFFEXOR-XR) 150 MG 24 hr capsule Take 1 capsule (150 mg total) by mouth daily with breakfast.   No facility-administered encounter medications on file as of 11/25/2022.    Allergies (verified) Patient has no known allergies.   History: Past Medical History:  Diagnosis Date   Allergy  RHINITIS   Arthritis    COVID-19    Depression    Family history of cancer of female genital organ    Family history of lung cancer    Family history of throat cancer    Glaucoma    Hyperlipidemia    Hypertension    Osteopenia    TMJ syndrome    Past Surgical History:  Procedure Laterality Date   BIOPSY BREAST     BLEPHAROPLASTY     KNEE ARTHROSCOPY     right knee, 1990 and  2012, Dr. Ty Hilts JOINT SURGERY     TONSILLECTOMY     TOTAL KNEE ARTHROPLASTY  08/10/2012   Procedure: TOTAL KNEE ARTHROPLASTY;  Surgeon: Garald Balding, MD;  Location: [facility];  Service: Orthopedics;  Laterality: Right;  Right Total Knee Arthroplasty   TOTAL KNEE ARTHROPLASTY Left 06/14/2019   Procedure: LEFT TOTAL KNEE ARTHROPLASTY;  Surgeon: Garald Balding, MD;  Location: [facility];  Service: Orthopedics;  Laterality: Left;   Family History  Problem Relation Age of Onset   Heart disease Mother        valve replacement   Breast cancer Mother 54   Throat cancer Mother 8       smoker   Heart disease Father        died of brain embolism after hip surgery   Arthritis Father    Arthritis Brother    Breast cancer Maternal Aunt        dx in her 74s   Lung cancer Maternal Aunt        dx in her 50s/60s, non-smoker   Vaginal cancer Maternal Aunt        dx in her 22s   Breast cancer Other        dx in her 38s, maternal first cousin's daughter   Diabetes Neg Hx    Hypertension Neg Hx    Stroke Neg Hx    Colon cancer Neg Hx    Social History   Socioeconomic History   Marital status: Married    Spouse name: Not on file   Number of children: Not on file   Years of education: Not on file   Highest education level: Professional school degree (e.g., MD, DDS, DVM, JD)  Occupational History   Occupation: attorney    Employer: [employer]  Tobacco Use   Smoking status: Former    Packs/day: 1.00    Years: 12.00    Total pack years: 12.00    Types: Cigarettes    Quit date: 02/10/1980    Years since quitting: 42.8   Smokeless tobacco: Never  Vaping Use   Vaping Use: Never used  Substance and Sexual Activity   Alcohol use: Yes    Alcohol/week: 1.0 standard drink of alcohol    Types: 1 Glasses of wine per week    Comment: daily   Drug use: No   Sexual activity: Yes  Other Topics Concern   Not on file  Social History Narrative   Exercises with  pilates, golf, and has a trainer   Social Determinants of Health   Financial Resource Strain: Low Risk  (11/25/2022)   Overall Financial Resource Strain (CARDIA)    Difficulty of Paying Living Expenses: Not hard at all  Food Insecurity: No Food Insecurity (11/25/2022)   Hunger Vital Sign    Worried About Running Out of Food in the Last Year: Never true    Ran Out of Food  in the Last Year: Never true  Transportation Needs: No Transportation Needs (11/25/2022)   PRAPARE - Hydrologist (Medical): No    Lack of Transportation (Non-Medical): No  Physical Activity: Insufficiently Active (11/25/2022)   Exercise Vital Sign    Days of Exercise per Week: 2 days    Minutes of Exercise per Session: 30 min  Stress: No Stress Concern Present (11/25/2022)   [place]    Feeling of Stress : Not at all  Social Connections: Not on file    Tobacco Counseling Counseling given: Not Answered   Clinical Intake:  Pre-visit preparation completed: Yes  Pain : No/denies pain     Nutritional Status: BMI 25 -29 Overweight Nutritional Risks: None Diabetes: No  How often do you need to have someone help you when you read instructions, pamphlets, or other written materials from your doctor or pharmacy?: 1 - Never  Diabetic? no  Interpreter Needed?: No  Information entered by :: NAllen LPN   Activities of Daily Living    11/25/2022   10:33 AM 11/24/2022   12:01 PM  In your present state of health, do you have any difficulty performing the following activities:  Hearing? 0 0  Vision? 0 0  Difficulty concentrating or making decisions? 0 0  Walking or climbing stairs? 0 0  Dressing or bathing? 0 0  Doing errands, shopping? 0   Preparing Food and eating ? N N  Using the Toilet? N N  In the past six months, have you accidently leaked urine? N N  Do you have problems with loss of bowel control? N N  Managing  your Medications? N N  Managing your Finances? N   Housekeeping or managing your Housekeeping? N N    Patient Care Team: Denita Lung, MD as PCP - General (Family Medicine)  Indicate any recent Medical Services you may have received from other than [facility] providers in the past year (date may be approximate).     Assessment:   This is a routine wellness examination for Jodi Duffy.  Hearing/Vision screen Vision Screening - Comments:: Regular eye exams, [facility]  Dietary issues and exercise activities discussed: Current Exercise Habits: Home exercise routine, Type of exercise: Other - see comments (personal trainer), Time (Minutes): 30, Frequency (Times/Week): 2, Weekly Exercise (Minutes/Week): 60   Goals Addressed             This Visit's Progress    Patient Stated       11/25/2022, wants to lose weight       Depression Screen    11/25/2022   10:33 AM 03/27/2022   11:14 AM 02/27/2022    3:23 PM 11/22/2021    8:16 AM 08/21/2020   11:43 AM 08/02/2019    9:46 AM 02/10/2018    9:45 AM  PHQ 2/9 Scores  PHQ - 2 Score 0 0 0 0 0 0 0  PHQ- 9 Score 2 0 3        Fall Risk    11/25/2022   10:33 AM 11/24/2022   12:01 PM 11/22/2021    8:16 AM 08/21/2020   11:42 AM 08/02/2019    9:46 AM  [place] in the past year? 0 0 0 0 0  Number falls in past yr: 0 0     Injury with Fall? 0 0     Risk for fall due to : Medication side effect  Medication side effect No Fall Risks   Follow up Falls prevention discussed;Education provided;Falls evaluation completed  Falls evaluation completed;Education provided;Falls prevention discussed      FALL RISK PREVENTION PERTAINING TO THE HOME:  Any stairs in or around the home? Yes  If so, are there any without handrails? No  Home free of loose throw rugs in walkways, pet beds, electrical cords, etc? Yes  Adequate lighting in your home to reduce risk of falls? Yes   ASSISTIVE DEVICES UTILIZED TO PREVENT FALLS:  Life alert? No  Use of a cane,  walker or w/c? No  Grab bars in the bathroom? No  Shower chair or bench in shower? No  Elevated toilet seat or a handicapped toilet? Yes   TIMED UP AND GO:  Was the test performed? No .      Cognitive Function:        11/25/2022   10:34 AM 11/22/2021    8:17 AM  6CIT Screen  What Year? 0 points 0 points  What month? 0 points 0 points  What time? 0 points 0 points  Count back from 20 0 points 0 points  Months in reverse 0 points 0 points  Repeat phrase 2 points 0 points  Total Score 2 points 0 points    Immunizations Immunization History  Administered Date(s) Administered   DTaP 08/02/1998   Fluad Quad(high Dose 65+) 08/02/2019, 08/21/2020   Influenza Split 08/11/2012   Influenza Whole 09/04/2008, 08/27/2010   Influenza, High Dose Seasonal PF 09/06/2014, 08/07/2018, 08/23/2021   Influenza,inj,Quad PF,6+ Mos 08/08/2013   Influenza-Unspecified 08/17/2014, 08/18/2015, 08/17/2016, 08/17/2017, 08/18/2019   PFIZER(Purple Top)SARS-COV-2 Vaccination 12/07/2019, 12/25/2019, 08/15/2020   Pfizer Covid-19 Vaccine Bivalent Booster 81yr & up 09/19/2021   Pneumococcal Conjugate-13 11/15/2015   Pneumococcal Polysaccharide-23 08/02/1998, 11/15/2012   Tdap 10/04/2008, 06/21/2018   Zoster Recombinat (Shingrix) 02/09/2017, 04/30/2017   Zoster, Live 08/27/2010    TDAP status: Up to date  Flu Vaccine status: Up to date  Pneumococcal vaccine status: Up to date  Covid-19 vaccine status: Completed vaccines  Qualifies for Shingles Vaccine? Yes   Zostavax completed Yes   Shingrix Completed?: Yes  Screening Tests Health Maintenance  Topic Date Due   COVID-19 Vaccine (5 - 2023-24 season) 07/18/2022   Medicare Annual Wellness (AWV)  11/22/2022   INFLUENZA VACCINE  02/15/2023 (Originally 06/17/2022)   MAMMOGRAM  08/15/2023   Fecal DNA (Cologuard)  09/25/2024   DTaP/Tdap/Td (4 - Td or Tdap) 06/21/2028   DEXA SCAN  Completed   Hepatitis C Screening  Completed   Zoster Vaccines-  Shingrix  Completed   HPV VACCINES  Aged Out   Pneumonia Vaccine 74 Years old  Discontinued   COLONOSCOPY (Pts 45-468yrInsurance coverage will need to be confirmed)  Discontinued    Health Maintenance  Health Maintenance Due  Topic Date Due   COVID-19 Vaccine (5 - 2023-24 season) 07/18/2022   Medicare Annual Wellness (AWV)  11/22/2022    Colorectal cancer screening: Type of screening: Cologuard. Completed 09/25/2021. Repeat every 3 years  Mammogram status: Completed 08/14/2022. Repeat every year  Bone Density status: Completed 07/19/2019.   Lung Cancer Screening: (Low Dose CT Chest recommended if Age 74-80ears, 30 pack-year currently smoking OR have quit w/in 15years.) does not qualify.   Lung Cancer Screening Referral: no  Additional Screening:  Hepatitis C Screening: does qualify; Completed 10/16/2015  Vision Screening: Recommended annual ophthalmology exams for early detection of glaucoma and other disorders of the eye. Is the patient up to  date with their annual eye exam?  Yes  Who is the provider or what is the name of the office in which the patient attends annual eye exams? [facility] If pt is not established with a provider, would they like to be referred to a provider to establish care? No .   Dental Screening: Recommended annual dental exams for proper oral hygiene  Community Resource Referral / Chronic Care Management: CRR required this visit?  No   CCM required this visit?  No      Plan:     I have personally reviewed and noted the following in the patient's chart:   Medical and social history Use of alcohol, tobacco or illicit drugs  Current medications and supplements including opioid prescriptions. Patient is not currently taking opioid prescriptions. Functional ability and status Nutritional status Physical activity Advanced directives List of other physicians Hospitalizations, surgeries, and ER visits in previous 12  months Vitals Screenings to include cognitive, depression, and falls Referrals and appointments  In addition, I have reviewed and discussed with patient certain preventive protocols, quality metrics, and best practice recommendations. A written personalized care plan for preventive services as well as general preventive health recommendations were provided to patient.     Kellie Simmering, LPN   02/20/6255   Nurse Notes: none  Due to this being a virtual visit, the after visit summary with patients personalized plan was offered to patient via mail or my-chart.  Patient would like to access on my-chart

## 2022-11-25 NOTE — Patient Instructions (Signed)
Jodi Duffy , Thank you for taking time to come for your Medicare Wellness Visit. I appreciate your ongoing commitment to your health goals. Please review the following plan we discussed and let me know if I can assist you in the future.   These are the goals we discussed:  Goals      Patient Stated     11/22/2021, stay healthy     Patient Stated     11/25/2022, wants to lose weight        This is a list of the screening recommended for you and due dates:  Health Maintenance  Topic Date Due   COVID-19 Vaccine (5 - 2023-24 season) 07/18/2022   Flu Shot  02/15/2023*   Mammogram  08/15/2023   Medicare Annual Wellness Visit  11/26/2023   Cologuard (Stool DNA test)  09/25/2024   DTaP/Tdap/Td vaccine (4 - Td or Tdap) 06/21/2028   DEXA scan (bone density measurement)  Completed   Hepatitis C Screening: USPSTF Recommendation to screen - Ages 32-79 yo.  Completed   Zoster (Shingles) Vaccine  Completed   HPV Vaccine  Aged Out   Pneumonia Vaccine  Discontinued   Colon Cancer Screening  Discontinued  *Topic was postponed. The date shown is not the original due date.    Advanced directives: copy in chart  Conditions/risks identified: none  Next appointment: Follow up in one year for your annual wellness visit    Preventive Care 65 Years and Older, Female Preventive care refers to lifestyle choices and visits with your health care provider that can promote health and wellness. What does preventive care include? A yearly physical exam. This is also called an annual well check. Dental exams once or twice a year. Routine eye exams. Ask your health care provider how often you should have your eyes checked. Personal lifestyle choices, including: Daily care of your teeth and gums. Regular physical activity. Eating a healthy diet. Avoiding tobacco and drug use. Limiting alcohol use. Practicing safe sex. Taking low-dose aspirin every day. Taking vitamin and mineral supplements as  recommended by your health care provider. What happens during an annual well check? The services and screenings done by your health care provider during your annual well check will depend on your age, overall health, lifestyle risk factors, and family history of disease. Counseling  Your health care provider may ask you questions about your: Alcohol use. Tobacco use. Drug use. Emotional well-being. Home and relationship well-being. Sexual activity. Eating habits. History of falls. Memory and ability to understand (cognition). Work and work Statistician. Reproductive health. Screening  You may have the following tests or measurements: Height, weight, and BMI. Blood pressure. Lipid and cholesterol levels. These may be checked every 5 years, or more frequently if you are over 64 years old. Skin check. Lung cancer screening. You may have this screening every year starting at age 10 if you have a 30-pack-year history of smoking and currently smoke or have quit within the past 15 years. Fecal occult blood test (FOBT) of the stool. You may have this test every year starting at age 66. Flexible sigmoidoscopy or colonoscopy. You may have a sigmoidoscopy every 5 years or a colonoscopy every 10 years starting at age 79. Hepatitis C blood test. Hepatitis B blood test. Sexually transmitted disease (STD) testing. Diabetes screening. This is done by checking your blood sugar (glucose) after you have not eaten for a while (fasting). You may have this done every 1-3 years. Bone density scan. This is done  to screen for osteoporosis. You may have this done starting at age 78. Mammogram. This may be done every 1-2 years. Talk to your health care provider about how often you should have regular mammograms. Talk with your health care provider about your test results, treatment options, and if necessary, the need for more tests. Vaccines  Your health care provider may recommend certain vaccines, such  as: Influenza vaccine. This is recommended every year. Tetanus, diphtheria, and acellular pertussis (Tdap, Td) vaccine. You may need a Td booster every 10 years. Zoster vaccine. You may need this after age 72. Pneumococcal 13-valent conjugate (PCV13) vaccine. One dose is recommended after age 5. Pneumococcal polysaccharide (PPSV23) vaccine. One dose is recommended after age 40. Talk to your health care provider about which screenings and vaccines you need and how often you need them. This information is not intended to replace advice given to you by your health care provider. Make sure you discuss any questions you have with your health care provider. Document Released: 11/30/2015 Document Revised: 07/23/2016 Document Reviewed: 09/04/2015 Elsevier Interactive Patient Education  2017 [place] Prevention in the Home Falls can cause injuries. They can happen to people of all ages. There are many things you can do to make your home safe and to help prevent falls. What can I do on the outside of my home? Regularly fix the edges of walkways and driveways and fix any cracks. Remove anything that might make you trip as you walk through a door, such as a raised step or threshold. Trim any bushes or trees on the path to your home. Use bright outdoor lighting. Clear any walking paths of anything that might make someone trip, such as rocks or tools. Regularly check to see if handrails are loose or broken. Make sure that both sides of any steps have handrails. Any raised decks and porches should have guardrails on the edges. Have any leaves, snow, or ice cleared regularly. Use sand or salt on walking paths during winter. Clean up any spills in your garage right away. This includes oil or grease spills. What can I do in the bathroom? Use night lights. Install grab bars by the toilet and in the tub and shower. Do not use towel bars as grab bars. Use non-skid mats or decals in the tub or  shower. If you need to sit down in the shower, use a plastic, non-slip stool. Keep the floor dry. Clean up any water that spills on the floor as soon as it happens. Remove soap buildup in the tub or shower regularly. Attach bath mats securely with double-sided non-slip rug tape. Do not have throw rugs and other things on the floor that can make you trip. What can I do in the bedroom? Use night lights. Make sure that you have a light by your bed that is easy to reach. Do not use any sheets or blankets that are too big for your bed. They should not hang down onto the floor. Have a firm chair that has side arms. You can use this for support while you get dressed. Do not have throw rugs and other things on the floor that can make you trip. What can I do in the kitchen? Clean up any spills right away. Avoid walking on wet floors. Keep items that you use a lot in easy-to-reach places. If you need to reach something above you, use a strong step stool that has a grab bar. Keep electrical cords out of the  way. Do not use floor polish or wax that makes floors slippery. If you must use wax, use non-skid floor wax. Do not have throw rugs and other things on the floor that can make you trip. What can I do with my stairs? Do not leave any items on the stairs. Make sure that there are handrails on both sides of the stairs and use them. Fix handrails that are broken or loose. Make sure that handrails are as long as the stairways. Check any carpeting to make sure that it is firmly attached to the stairs. Fix any carpet that is loose or worn. Avoid having throw rugs at the top or bottom of the stairs. If you do have throw rugs, attach them to the floor with carpet tape. Make sure that you have a light switch at the top of the stairs and the bottom of the stairs. If you do not have them, ask someone to add them for you. What else can I do to help prevent falls? Wear shoes that: Do not have high heels. Have  rubber bottoms. Are comfortable and fit you well. Are closed at the toe. Do not wear sandals. If you use a stepladder: Make sure that it is fully opened. Do not climb a closed stepladder. Make sure that both sides of the stepladder are locked into place. Ask someone to hold it for you, if possible. Clearly mark and make sure that you can see: Any grab bars or handrails. First and last steps. Where the edge of each step is. Use tools that help you move around (mobility aids) if they are needed. These include: Canes. Walkers. Scooters. Crutches. Turn on the lights when you go into a dark area. Replace any light bulbs as soon as they burn out. Set up your furniture so you have a clear path. Avoid moving your furniture around. If any of your floors are uneven, fix them. If there are any pets around you, be aware of where they are. Review your medicines with your doctor. Some medicines can make you feel dizzy. This can increase your chance of falling. Ask your doctor what other things that you can do to help prevent falls. This information is not intended to replace advice given to you by your health care provider. Make sure you discuss any questions you have with your health care provider. Document Released: 08/30/2009 Document Revised: 04/10/2016 Document Reviewed: 12/08/2014 Elsevier Interactive Patient Education  2017 [employer].

## 2022-12-16 DIAGNOSIS — G510 Bell's palsy: Secondary | ICD-10-CM | POA: Diagnosis not present

## 2022-12-16 DIAGNOSIS — H40033 Anatomical narrow angle, bilateral: Secondary | ICD-10-CM | POA: Diagnosis not present

## 2022-12-16 DIAGNOSIS — H04123 Dry eye syndrome of bilateral lacrimal glands: Secondary | ICD-10-CM | POA: Diagnosis not present

## 2022-12-16 DIAGNOSIS — H209 Unspecified iridocyclitis: Secondary | ICD-10-CM | POA: Diagnosis not present

## 2022-12-16 DIAGNOSIS — H04221 Epiphora due to insufficient drainage, right lacrimal gland: Secondary | ICD-10-CM | POA: Diagnosis not present

## 2022-12-16 DIAGNOSIS — H1013 Acute atopic conjunctivitis, bilateral: Secondary | ICD-10-CM | POA: Diagnosis not present

## 2022-12-16 DIAGNOSIS — Z961 Presence of intraocular lens: Secondary | ICD-10-CM | POA: Diagnosis not present

## 2022-12-16 DIAGNOSIS — H40053 Ocular hypertension, bilateral: Secondary | ICD-10-CM | POA: Diagnosis not present

## 2023-03-10 DIAGNOSIS — M79642 Pain in left hand: Secondary | ICD-10-CM | POA: Diagnosis not present

## 2023-03-10 DIAGNOSIS — M654 Radial styloid tenosynovitis [de Quervain]: Secondary | ICD-10-CM | POA: Diagnosis not present

## 2023-04-14 NOTE — Progress Notes (Unsigned)
No chief complaint on file.    ASSESSMENT/PLAN:   Last seen for med check with JCL in 03/2022, was supposed to schedule a med check + for 1 year, never scheduled. Should be out of her lipitor and effexor. ?still taking benicar HCT? Same for that one, not sure what she has left If needing refills, MUST schedule visit with JCL when leaving, and 90d only (or less?)

## 2023-04-15 ENCOUNTER — Other Ambulatory Visit: Payer: Self-pay | Admitting: Family Medicine

## 2023-04-15 ENCOUNTER — Ambulatory Visit (INDEPENDENT_AMBULATORY_CARE_PROVIDER_SITE_OTHER): Payer: Medicare Other | Admitting: Family Medicine

## 2023-04-15 ENCOUNTER — Encounter: Payer: Self-pay | Admitting: Family Medicine

## 2023-04-15 VITALS — BP 150/80 | HR 100 | Temp 98.8°F | Ht 63.0 in | Wt 152.2 lb

## 2023-04-15 DIAGNOSIS — R10816 Epigastric abdominal tenderness: Secondary | ICD-10-CM | POA: Diagnosis not present

## 2023-04-15 DIAGNOSIS — F341 Dysthymic disorder: Secondary | ICD-10-CM

## 2023-04-15 DIAGNOSIS — R2689 Other abnormalities of gait and mobility: Secondary | ICD-10-CM

## 2023-04-15 DIAGNOSIS — R5383 Other fatigue: Secondary | ICD-10-CM | POA: Diagnosis not present

## 2023-04-15 DIAGNOSIS — E785 Hyperlipidemia, unspecified: Secondary | ICD-10-CM

## 2023-04-15 DIAGNOSIS — R635 Abnormal weight gain: Secondary | ICD-10-CM | POA: Diagnosis not present

## 2023-04-15 DIAGNOSIS — R110 Nausea: Secondary | ICD-10-CM | POA: Diagnosis not present

## 2023-04-15 NOTE — Telephone Encounter (Signed)
Pt has an acute appointment today. I will refill for 30 days but pt is due for a visit with Dr. Susann Givens

## 2023-04-15 NOTE — Patient Instructions (Signed)
We are checking labs today to evaluate your fatigue, nausea, and balance concerns. Given the tenderness in your upper stomach, and your history of some inflammation in the stomach (noted on prior CT) as well as history of anemia in 2022 at the same time, I think it would be wise to restart Prilosec OTC (or the Nexium 24 hour that is over-the-counter). Take this once daily. You will see your lab results through MyChart in 1-2 days.   We referred you to physical therapy to help with your balance.

## 2023-04-16 LAB — COMPREHENSIVE METABOLIC PANEL
ALT: 18 IU/L (ref 0–32)
AST: 15 IU/L (ref 0–40)
Albumin/Globulin Ratio: 2 (ref 1.2–2.2)
Albumin: 4.5 g/dL (ref 3.8–4.8)
Alkaline Phosphatase: 82 IU/L (ref 44–121)
BUN/Creatinine Ratio: 25 (ref 12–28)
BUN: 21 mg/dL (ref 8–27)
Bilirubin Total: 0.2 mg/dL (ref 0.0–1.2)
CO2: 23 mmol/L (ref 20–29)
Calcium: 9.9 mg/dL (ref 8.7–10.3)
Chloride: 99 mmol/L (ref 96–106)
Creatinine, Ser: 0.83 mg/dL (ref 0.57–1.00)
Globulin, Total: 2.3 g/dL (ref 1.5–4.5)
Glucose: 96 mg/dL (ref 70–99)
Potassium: 4.2 mmol/L (ref 3.5–5.2)
Sodium: 138 mmol/L (ref 134–144)
Total Protein: 6.8 g/dL (ref 6.0–8.5)
eGFR: 74 mL/min/{1.73_m2} (ref >59)

## 2023-04-16 LAB — CBC WITH DIFFERENTIAL/PLATELET
Basophils Absolute: 0.1 10*3/uL (ref 0.0–0.2)
Basos: 1 %
EOS (ABSOLUTE): 0.1 10*3/uL (ref 0.0–0.4)
Eos: 2 %
Hematocrit: 40.1 % (ref 34.0–46.6)
Hemoglobin: 13.6 g/dL (ref 11.1–15.9)
Immature Grans (Abs): 0 10*3/uL (ref 0.0–0.1)
Immature Granulocytes: 0 %
Lymphocytes Absolute: 1.2 10*3/uL (ref 0.7–3.1)
Lymphs: 20 %
MCH: 30.2 pg (ref 26.6–33.0)
MCHC: 33.9 g/dL (ref 31.5–35.7)
MCV: 89 fL (ref 79–97)
Monocytes Absolute: 0.5 10*3/uL (ref 0.1–0.9)
Monocytes: 8 %
Neutrophils Absolute: 4.1 10*3/uL (ref 1.4–7.0)
Neutrophils: 69 %
Platelets: 313 10*3/uL (ref 150–450)
RBC: 4.5 x10E6/uL (ref 3.77–5.28)
RDW: 13.3 % (ref 11.7–15.4)
WBC: 6 10*3/uL (ref 3.4–10.8)

## 2023-04-16 LAB — TSH: TSH: 1.95 u[IU]/mL (ref 0.450–4.500)

## 2023-05-25 ENCOUNTER — Other Ambulatory Visit: Payer: Self-pay | Admitting: Family Medicine

## 2023-05-25 DIAGNOSIS — F341 Dysthymic disorder: Secondary | ICD-10-CM

## 2023-05-25 DIAGNOSIS — E785 Hyperlipidemia, unspecified: Secondary | ICD-10-CM

## 2023-05-27 ENCOUNTER — Other Ambulatory Visit: Payer: Self-pay | Admitting: Family Medicine

## 2023-05-27 DIAGNOSIS — I10 Essential (primary) hypertension: Secondary | ICD-10-CM

## 2023-05-27 NOTE — Telephone Encounter (Signed)
Last prescribed more than 1 year ago on 05/12/2022. Is it ok to refill?

## 2023-06-04 ENCOUNTER — Ambulatory Visit: Payer: Medicare Other | Attending: Family Medicine

## 2023-06-04 DIAGNOSIS — R2681 Unsteadiness on feet: Secondary | ICD-10-CM | POA: Diagnosis not present

## 2023-06-04 DIAGNOSIS — R2689 Other abnormalities of gait and mobility: Secondary | ICD-10-CM | POA: Insufficient documentation

## 2023-06-04 DIAGNOSIS — M6281 Muscle weakness (generalized): Secondary | ICD-10-CM | POA: Diagnosis not present

## 2023-06-04 NOTE — Therapy (Signed)
OUTPATIENT PHYSICAL THERAPY LOWER EXTREMITY EVALUATION   Patient Name: Jodi Duffy MRN: [id number] DOB:February 12, 1951, 72 y.o., female Today's Date: 06/04/2023  END OF SESSION:  PT End of Session - 06/04/23 1459     Visit Number 1    Number of Visits 12    Date for PT Re-Evaluation 07/30/23    Authorization Type MCR/MCD    PT Start Time 1447    PT Stop Time 1525    PT Time Calculation (min) 38 min    Activity Tolerance Patient tolerated treatment well    Behavior During Therapy WFL for tasks assessed/performed             Past Medical History:  Diagnosis Date   Allergy    RHINITIS   Arthritis    COVID-19    Depression    Family history of cancer of female genital organ    Family history of lung cancer    Family history of throat cancer    Glaucoma    Hyperlipidemia    Hypertension    Osteopenia    TMJ syndrome    Past Surgical History:  Procedure Laterality Date   BIOPSY BREAST     BLEPHAROPLASTY     KNEE ARTHROSCOPY     right knee, 1990 and 2012, Dr. Nat Math JOINT SURGERY     TONSILLECTOMY     TOTAL KNEE ARTHROPLASTY  08/10/2012   Procedure: TOTAL KNEE ARTHROPLASTY;  Surgeon: Valeria Batman, MD;  Location: [facility];  Service: Orthopedics;  Laterality: Right;  Right Total Knee Arthroplasty   TOTAL KNEE ARTHROPLASTY Left 06/14/2019   Procedure: LEFT TOTAL KNEE ARTHROPLASTY;  Surgeon: Valeria Batman, MD;  Location: [facility];  Service: Orthopedics;  Laterality: Left;   Patient Active Problem List   Diagnosis Date Noted   History of cataract surgery, unspecified laterality 09/19/2021   Genetic testing 11/06/2020   Family history of throat cancer    Family history of lung cancer    Family history of cancer of female genital organ    Cotton wool spots 09/29/2018   Epidermoid cyst of skin 03/17/2018   Essential hypertension 02/10/2018   Other seasonal allergic rhinitis 11/15/2015   Family history of breast cancer in mother 09/06/2014    Dysthymia 09/06/2014   Glaucoma 09/06/2014   Hyperlipidemia LDL goal <100 09/06/2014   S/P TKR (total knee replacement) 08/12/2012   Family history of heart disease in female family member before age 69 07/22/2012    PCP: Ronnald Nian, MD  REFERRING PROVIDER: Joselyn Arrow, MD  REFERRING DIAG: Balance problems [R26.89]   THERAPY DIAG:  Unsteadiness on feet  Muscle weakness (generalized)  Rationale for Evaluation and Treatment: Rehabilitation  ONSET DATE: 3-4 years   SUBJECTIVE:   SUBJECTIVE STATEMENT: Patient reports that she has had frequent falls and near falls related to her balance. "Sometimes I drag my feet when I'm walking. I can't stand on one foot." She states that she tried to play pickleball, but the surface was too hard. She stays active by working with a Systems analyst (balance) and golfing.   She has been told she shuffles when she walks.  PERTINENT HISTORY: PMHx includes Arthritis, Depression, HTN, Osteopenia  4 year ago - Lt TKR  10 year ago - Rt TKR  PAIN:  Are you having pain? No  PRECAUTIONS: None  RED FLAGS: None   WEIGHT BEARING RESTRICTIONS: No  FALLS:  Has patient fallen in last 6 months? Yes. Number of  falls frequently near falls "then I stop myself."   LIVING ENVIRONMENT: Lives with: lives with their spouse Lives in: House/apartment Stairs: Yes: Internal: 12-15 steps; on right going up and External: 4 steps; on right going up Has following equipment at home: None  OCCUPATION: retired   PLOF: Independent  PATIENT GOALS: "To work on my balance"   NEXT MD VISIT: 06/08/23  OBJECTIVE:    PATIENT SURVEYS:  FOTO 53 current, 57 predicted   COGNITION: Overall cognitive status: Within functional limits for tasks assessed     LOWER EXTREMITY MMT: to be assessed at follow up   MMT Right eval Left eval  Hip flexion    Hip extension    Hip abduction    Hip adduction    Hip internal rotation    Hip external rotation    Knee  flexion    Knee extension    Ankle dorsiflexion    Ankle plantarflexion    Ankle inversion    Ankle eversion     (Blank rows = not tested)   FUNCTIONAL TESTS:  Functional gait assessment: 14 total      Balance Tests Eval    Romberg on even ground  30 sec   Romberg on uneven ground  30 sec   Lt Tandem on even ground 15 sec Mod-max sway   Rt Tandem on even ground  3 sec   Romberg on even ground EC 30 sec    Romberg on uneven ground EC  30 sec  Mod-max sway     GAIT: Distance walked: 150 ft  Assistive device utilized: None Level of assistance: Complete Independence Comments: slow gait with wide BOS    TODAY'S TREATMENT:                                                                                                                               [facility] Adult PT Treatment:                                                DATE: 06/04/2023  Therapeutic Activity: Patient education as noted below     PATIENT EDUCATION:  Education details: discussion of POC, prognosis and goals for skilled PT; discussion of components of balance and her related deficits    Person educated: Patient Education method: Medical illustrator Education comprehension: verbalized understanding and needs further education  HOME EXERCISE PROGRAM: To be initiated at follow up visit.   ASSESSMENT:  CLINICAL IMPRESSION: Patient is a 72 y.o. female  who was seen today for physical therapy evaluation and treatment for impaired balance and frequent falling. She is demonstrating decreased standing balance in narrow stance, on compliant surfaces, difficulty with dynamic balance with ambulation. She has related difficulty with navigating uneven surfaces, recreational activities, stair navigation, and overall safety with ambulation. She  requires skilled PT services at this time to address relevant deficits and improve overall function.     OBJECTIVE IMPAIRMENTS: Abnormal gait, decreased activity tolerance,  decreased balance, decreased endurance, decreased mobility, and decreased safety awareness.   ACTIVITY LIMITATIONS: carrying, lifting, bending, squatting, stairs, and locomotion level  PARTICIPATION LIMITATIONS: cleaning and shopping  PERSONAL FACTORS: Age, Past/current experiences, Time since onset of injury/illness/exacerbation, and 1-2 comorbidities: PMHx includes Arthritis, Depression, HTN, Osteopenia, BIL TKR  are also affecting patient's functional outcome.   REHAB POTENTIAL: Fair    CLINICAL DECISION MAKING: Stable/uncomplicated  EVALUATION COMPLEXITY: Low   GOALS: Goals reviewed with patient? Yes  SHORT TERM GOALS: Target date: 07/02/2023    Patient will be independent with initial home program for balance retraining and LE strengthening as indicated.  Baseline: provided at eval  Goal status: INITIAL  2.  Patient will demonstrate improved Rt tandem stance to at least 15 sec.  Baseline: see objective measures  Goal status: INITIAL   LONG TERM GOALS: Target date: 07/30/2023   Patient will report improved overall functional ability with FOTO score of 57 or greater.  Baseline: 53 Goal status: INITIAL  2.  Patient will report less than 3 near falls over past 7 days.  Baseline: very frequent  Goal status: INITIAL  3.  Patient will demonstrate ability to perform at least 10 steps of tandem walking in order to have improved navigation of narrow spaces.  Baseline: 4-5 steps in a row.  Goal status: INITIAL  4.  Patient will demonstrate ability to safely pick up items from 1-10# without LOB.  Baseline: unsteady with bending forward.  Goal status: INITIAL  5.  Patient will demonstrate improved FGA score to at least 23 or greater in order to demonstrate decreased fall risk.  Baseline: 14 Goal status: INITIAL    PLAN:  PT FREQUENCY: 1-2x/week  PT DURATION: 8 weeks  PLANNED INTERVENTIONS: Therapeutic exercises, Therapeutic activity, Neuromuscular re-education,  Balance training, Gait training, Patient/Family education, Self Care, Joint mobilization, Stair training, Manual therapy, and Re-evaluation  PLAN FOR NEXT SESSION: static balance training on even/uneven surfaces, dynamic balance and gait training, balance with EC, balance with head nods/turns, create and review initial HEP, complete LE MMT  Mauri Reading, PT, DPT 06/04/2023, 6:17 PM

## 2023-06-08 ENCOUNTER — Encounter: Payer: Self-pay | Admitting: Family Medicine

## 2023-06-08 ENCOUNTER — Ambulatory Visit (INDEPENDENT_AMBULATORY_CARE_PROVIDER_SITE_OTHER): Payer: Medicare Other | Admitting: Family Medicine

## 2023-06-08 VITALS — BP 130/70 | HR 92 | Temp 98.4°F | Resp 16 | Ht 63.0 in | Wt 151.2 lb

## 2023-06-08 DIAGNOSIS — Z9849 Cataract extraction status, unspecified eye: Secondary | ICD-10-CM | POA: Diagnosis not present

## 2023-06-08 DIAGNOSIS — J302 Other seasonal allergic rhinitis: Secondary | ICD-10-CM

## 2023-06-08 DIAGNOSIS — F341 Dysthymic disorder: Secondary | ICD-10-CM

## 2023-06-08 DIAGNOSIS — Z96651 Presence of right artificial knee joint: Secondary | ICD-10-CM

## 2023-06-08 DIAGNOSIS — E785 Hyperlipidemia, unspecified: Secondary | ICD-10-CM | POA: Diagnosis not present

## 2023-06-08 DIAGNOSIS — H409 Unspecified glaucoma: Secondary | ICD-10-CM | POA: Diagnosis not present

## 2023-06-08 DIAGNOSIS — Z8249 Family history of ischemic heart disease and other diseases of the circulatory system: Secondary | ICD-10-CM

## 2023-06-08 DIAGNOSIS — I10 Essential (primary) hypertension: Secondary | ICD-10-CM | POA: Diagnosis not present

## 2023-06-08 MED ORDER — ATORVASTATIN CALCIUM 20 MG PO TABS: 20.0000 mg | ORAL_TABLET | Freq: Every day | ORAL | 3 refills | Status: DC

## 2023-06-08 MED ORDER — OLMESARTAN MEDOXOMIL-HCTZ 40-12.5 MG PO TABS: 1.0000 | ORAL_TABLET | Freq: Every day | ORAL | 0 refills | Status: DC

## 2023-06-08 MED ORDER — VENLAFAXINE HCL ER 150 MG PO CP24: 150.0000 mg | ORAL_CAPSULE | Freq: Every day | ORAL | 3 refills | Status: DC

## 2023-06-08 NOTE — Progress Notes (Signed)
   Subjective:    Patient ID: Jodi Duffy, female    DOB: [birth year], 72 y.o.   MRN: [id number]  HPI She is here for a med check appointment.  She is now retired and enjoying her retirement.  She and her husband are planning a trip to [place] in the fall.  She has had some balance issues but is getting physical therapy and working with her trainer.  Recently she did have de Quervain's tenosynovitis with injection.  She does have a mammogram set up for later this year.  She continues to do nicely on Effexor and has no plans to want to stop that.  Continues on Benicar as well as Lipitor and having no difficulty with that.  Her allergies seem to be under good control.  She follows up concerning her glaucoma and previous history of cataracts.She has not concerns or complaint.   Review of Systems  All other systems reviewed and are negative.      Objective:    Physical Exam Alert and in no distress. Tympanic membranes and canals are normal. Pharyngeal area is normal. Neck is supple without adenopathy or thyromegaly. Cardiac exam shows a regular sinus rhythm without murmurs or gallops. Lungs are clear to auscultation.        Assessment & Plan:   Problem List Items Addressed This Visit     Dysthymia   Relevant Medications   venlafaxine XR (EFFEXOR-XR) 150 MG 24 hr capsule   Essential hypertension   Relevant Medications   atorvastatin (LIPITOR) 20 MG tablet   olmesartan-hydrochlorothiazide (BENICAR HCT) 40-12.5 MG tablet   Family history of heart disease in female family member before age 70   Glaucoma   History of cataract surgery, unspecified laterality   Hyperlipidemia LDL goal <100 - Primary   Relevant Medications   atorvastatin (LIPITOR) 20 MG tablet   olmesartan-hydrochlorothiazide (BENICAR HCT) 40-12.5 MG tablet   Other Relevant Orders   Lipid panel   Other seasonal allergic rhinitis   S/P TKR (total knee replacement)

## 2023-06-09 LAB — LIPID PANEL
Chol/HDL Ratio: 3.9 ratio (ref 0.0–4.4)
Cholesterol, Total: 205 mg/dL — ABNORMAL HIGH (ref 100–199)
HDL: 52 mg/dL (ref >39)
LDL Chol Calc (NIH): 115 mg/dL — ABNORMAL HIGH (ref 0–99)
Triglycerides: 221 mg/dL — ABNORMAL HIGH (ref 0–149)
VLDL Cholesterol Cal: 38 mg/dL (ref 5–40)

## 2023-06-15 ENCOUNTER — Telehealth: Payer: Self-pay | Admitting: Family Medicine

## 2023-06-15 NOTE — Telephone Encounter (Signed)
Pt states she lost your number and if you can give her another call.

## 2023-06-17 ENCOUNTER — Encounter: Payer: Medicare Other | Admitting: Physical Therapy

## 2023-06-18 DIAGNOSIS — Z23 Encounter for immunization: Secondary | ICD-10-CM | POA: Diagnosis not present

## 2023-06-19 ENCOUNTER — Ambulatory Visit: Payer: Medicare Other | Attending: Family Medicine

## 2023-06-19 DIAGNOSIS — R2681 Unsteadiness on feet: Secondary | ICD-10-CM | POA: Diagnosis not present

## 2023-06-19 DIAGNOSIS — M6281 Muscle weakness (generalized): Secondary | ICD-10-CM | POA: Insufficient documentation

## 2023-06-19 NOTE — Therapy (Signed)
OUTPATIENT PHYSICAL THERAPY LOWER EXTREMITY EVALUATION   Patient Name: Jodi Duffy MRN: [id number] DOB:[birth year], 72 y.o., female Today's Date: 06/19/2023  END OF SESSION:  PT End of Session - 06/19/23 0924     Visit Number 2    Number of Visits 12    Date for PT Re-Evaluation 07/30/23    Authorization Type MCR/MCD    PT Start Time 0920    PT Stop Time 0958    PT Time Calculation (min) 38 min    Activity Tolerance Patient tolerated treatment well    Behavior During Therapy [facility] for tasks assessed/performed              Past Medical History:  Diagnosis Date   Allergy    RHINITIS   Arthritis    COVID-19    Depression    Family history of cancer of female genital organ    Family history of lung cancer    Family history of throat cancer    Glaucoma    Hyperlipidemia    Hypertension    Osteopenia    TMJ syndrome    Past Surgical History:  Procedure Laterality Date   BIOPSY BREAST     BLEPHAROPLASTY     KNEE ARTHROSCOPY     right knee, 1990 and 2012, Dr. Nat Math JOINT SURGERY     TONSILLECTOMY     TOTAL KNEE ARTHROPLASTY  08/10/2012   Procedure: TOTAL KNEE ARTHROPLASTY;  Surgeon: Valeria Batman, MD;  Location: [facility];  Service: Orthopedics;  Laterality: Right;  Right Total Knee Arthroplasty   TOTAL KNEE ARTHROPLASTY Left 06/14/2019   Procedure: LEFT TOTAL KNEE ARTHROPLASTY;  Surgeon: Valeria Batman, MD;  Location: [facility];  Service: Orthopedics;  Laterality: Left;   Patient Active Problem List   Diagnosis Date Noted   History of cataract surgery, unspecified laterality 09/19/2021   Genetic testing 11/06/2020   Family history of throat cancer    Family history of lung cancer    Family history of cancer of female genital organ    Cotton wool spots 09/29/2018   Epidermoid cyst of skin 03/17/2018   Essential hypertension 02/10/2018   Other seasonal allergic rhinitis 11/15/2015   Family history of breast cancer in mother 09/06/2014    Dysthymia 09/06/2014   Glaucoma 09/06/2014   Hyperlipidemia LDL goal <100 09/06/2014   S/P TKR (total knee replacement) 08/12/2012   Family history of heart disease in female family member before age 31 07/22/2012    PCP: Ronnald Nian, MD  REFERRING PROVIDER: Joselyn Arrow, MD  REFERRING DIAG: Balance problems [R26.89]   THERAPY DIAG:  Unsteadiness on feet  Muscle weakness (generalized)  Rationale for Evaluation and Treatment: Rehabilitation  ONSET DATE: 3-4 years   SUBJECTIVE:   SUBJECTIVE STATEMENT: 06/19/23: Bird denies any recent falls since evaluation.   Evaluation: Patient reports that she has had frequent falls and near falls related to her balance. "Sometimes I drag my feet when I'm walking. I can't stand on one foot." She states that she tried to play pickleball, but the surface was too hard. She stays active by working with a Systems analyst (balance) and golfing. She has been told she shuffles when she walks.  PERTINENT HISTORY: PMHx includes Arthritis, Depression, HTN, Osteopenia  4 year ago - Lt TKR  10 year ago - Rt TKR  PAIN:  Are you having pain? No  PRECAUTIONS: None  RED FLAGS: None   WEIGHT BEARING RESTRICTIONS: No  FALLS:  Has patient fallen in last 6 months? Yes. Number of falls frequently near falls "then I stop myself."   LIVING ENVIRONMENT: Lives with: lives with their spouse Lives in: House/apartment Stairs: Yes: Internal: 12-15 steps; on right going up and External: 4 steps; on right going up Has following equipment at home: None  OCCUPATION: retired   PLOF: Independent  PATIENT GOALS: "To work on my balance"   NEXT MD VISIT: 06/08/23  OBJECTIVE:    PATIENT SURVEYS:  FOTO 53 current, 57 predicted   COGNITION: Overall cognitive status: Within functional limits for tasks assessed     LOWER EXTREMITY MMT: to be assessed at follow up   MMT Right eval Left eval  Hip flexion    Hip extension    Hip abduction    Hip  adduction    Hip internal rotation    Hip external rotation    Knee flexion    Knee extension    Ankle dorsiflexion    Ankle plantarflexion    Ankle inversion    Ankle eversion     (Blank rows = not tested)   FUNCTIONAL TESTS:  Functional gait assessment: 14 total      Balance Tests Eval    Romberg on even ground  30 sec   Romberg on uneven ground  30 sec   Lt Tandem on even ground 15 sec Mod-max sway   Rt Tandem on even ground  3 sec   Romberg on even ground EC 30 sec    Romberg on uneven ground EC  30 sec  Mod-max sway     GAIT: Distance walked: 150 ft  Assistive device utilized: None Level of assistance: Complete Independence Comments: slow gait with wide BOS    TODAY'S TREATMENT:          static balance training on even/uneven surfaces, dynamic balance and gait training, balance with EC, balance with head nods/turns,  create and review initial HEP, complete LE MMT   [facility] Adult PT Treatment:                                                DATE: 06/19/2023  Therapeutic Exercise: // bars: Walking Marches x 2 laps  Standing Heel-Toe Raises, 2 x 10  Hurdles (2) Stepover with airex pad in between lateral x 3 laps Hurdles (2) Stepover fwd x 2 laps Lt/x 2 laps Rt NuStep level 4 x 5 minutes at end of session    Neuromuscular re-ed: //bars:  Tandem stance, 2 x 30 sec each on floor Romberg EC, 2 x 30 sec on floor, 2 x 30 sec on airex  Romberg EO head turns/nods x1 min on floor, x 1 min on airex  Rockerboard forward/back rocking x 1 min, hold x 1 min   Rockerboard lateral rocking x 1 min, hold x 1 min    Assess LE strength at next visit.  [facility] Adult PT Treatment:                                                DATE: 06/04/2023  Therapeutic Activity: Patient education as noted below     PATIENT EDUCATION:  Education details: discussion of POC,  prognosis and goals for skilled PT; discussion of components of balance and her related deficits    Person educated: Patient Education method: Explanation and Demonstration Education comprehension: verbalized understanding and needs further education  HOME EXERCISE PROGRAM: Access Code: [email or web address] URL: [email or web address] Date: 06/19/2023 Prepared by: Mauri Reading  Exercises - Standing Tandem Balance with Counter Support  - 2 x daily - 7 x weekly - 3 sets - 20-30 sec hold - Heel Toe Raises with Counter Support  - 2 x daily - 7 x weekly - 2 sets - 10 reps - Mini Squat with Counter Support  - 2 x daily - 7 x weekly - 2 sets - 10 reps - Standing Marching  - 2 x daily - 7 x weekly - 2 sets - 10 reps  ASSESSMENT:  CLINICAL IMPRESSION: Liane did well today with static standing balance on floor. She was most challenge with tandem stance, hurdle navigation, and static balance on uneven surfaces. She had a couple instances of LOB with use of railings during tandem stance on floor, head nods/head turning on airex pad, and hurdle navigation. We will continue with balance training activities and LE strengthening. Created and provided initial home program today.    OBJECTIVE IMPAIRMENTS: Abnormal gait, decreased activity tolerance, decreased balance, decreased endurance, decreased mobility, and decreased safety awareness.   ACTIVITY LIMITATIONS: carrying, lifting, bending, squatting, stairs, and locomotion level  PARTICIPATION LIMITATIONS: cleaning and shopping  PERSONAL FACTORS: Age, Past/current experiences, Time since onset of injury/illness/exacerbation, and 1-2 comorbidities: PMHx includes Arthritis, Depression, HTN, Osteopenia, BIL TKR  are also affecting patient's functional outcome.   REHAB POTENTIAL: Fair    CLINICAL DECISION MAKING: Stable/uncomplicated  EVALUATION COMPLEXITY: Low   GOALS: Goals reviewed with patient? Yes  SHORT TERM GOALS: Target date:  07/02/2023    Patient will be independent with initial home program for balance retraining and LE strengthening as indicated.  Baseline: provided at eval  Goal status: INITIAL  2.  Patient will demonstrate improved Rt tandem stance to at least 15 sec.  Baseline: see objective measures  Goal status: INITIAL   LONG TERM GOALS: Target date: 07/30/2023   Patient will report improved overall functional ability with FOTO score of 57 or greater.  Baseline: 53 Goal status: INITIAL  2.  Patient will report less than 3 near falls over past 7 days.  Baseline: very frequent  Goal status: INITIAL  3.  Patient will demonstrate ability to perform at least 10 steps of tandem walking in order to have improved navigation of narrow spaces.  Baseline: 4-5 steps in a row.  Goal status: INITIAL  4.  Patient will demonstrate ability to safely pick up items from 1-10# without LOB.  Baseline: unsteady with bending forward.  Goal status: INITIAL  5.  Patient will demonstrate improved FGA score to at least 23 or greater in order to demonstrate decreased fall risk.  Baseline: 14 Goal status: INITIAL    PLAN:  PT FREQUENCY: 1-2x/week  PT DURATION: 8 weeks  PLANNED INTERVENTIONS: Therapeutic exercises, Therapeutic activity, Neuromuscular re-education, Balance training, Gait  training, Patient/Family education, Self Care, Joint mobilization, Stair training, Manual therapy, and Re-evaluation  PLAN FOR NEXT SESSION: static balance training on even/uneven surfaces, dynamic balance and gait training, balance with EC, balance with head nods/turns, create and review initial HEP, complete LE MMT  Mauri Reading, PT, DPT 06/19/2023, 1:16 PM

## 2023-06-29 DIAGNOSIS — H209 Unspecified iridocyclitis: Secondary | ICD-10-CM | POA: Diagnosis not present

## 2023-06-29 DIAGNOSIS — H40033 Anatomical narrow angle, bilateral: Secondary | ICD-10-CM | POA: Diagnosis not present

## 2023-06-29 DIAGNOSIS — Z961 Presence of intraocular lens: Secondary | ICD-10-CM | POA: Diagnosis not present

## 2023-06-29 DIAGNOSIS — H04221 Epiphora due to insufficient drainage, right lacrimal gland: Secondary | ICD-10-CM | POA: Diagnosis not present

## 2023-06-29 DIAGNOSIS — H04123 Dry eye syndrome of bilateral lacrimal glands: Secondary | ICD-10-CM | POA: Diagnosis not present

## 2023-06-29 DIAGNOSIS — G510 Bell's palsy: Secondary | ICD-10-CM | POA: Diagnosis not present

## 2023-06-29 DIAGNOSIS — H40053 Ocular hypertension, bilateral: Secondary | ICD-10-CM | POA: Diagnosis not present

## 2023-06-29 DIAGNOSIS — H1013 Acute atopic conjunctivitis, bilateral: Secondary | ICD-10-CM | POA: Diagnosis not present

## 2023-06-30 ENCOUNTER — Encounter: Payer: Self-pay | Admitting: Orthopaedic Surgery

## 2023-06-30 ENCOUNTER — Other Ambulatory Visit: Payer: Self-pay

## 2023-06-30 ENCOUNTER — Ambulatory Visit (INDEPENDENT_AMBULATORY_CARE_PROVIDER_SITE_OTHER): Payer: Medicare Other | Admitting: Orthopaedic Surgery

## 2023-06-30 ENCOUNTER — Other Ambulatory Visit (INDEPENDENT_AMBULATORY_CARE_PROVIDER_SITE_OTHER): Payer: Medicare Other

## 2023-06-30 DIAGNOSIS — Z96652 Presence of left artificial knee joint: Secondary | ICD-10-CM

## 2023-06-30 DIAGNOSIS — M25562 Pain in left knee: Secondary | ICD-10-CM

## 2023-06-30 MED ORDER — LIDOCAINE HCL 1 % IJ SOLN
3.0000 mL | INTRAMUSCULAR | Status: AC | PRN
  Administered 2023-06-30: 3 mL

## 2023-06-30 MED ORDER — METHYLPREDNISOLONE ACETATE 40 MG/ML IJ SUSP
40.0000 mg | INTRAMUSCULAR | Status: AC | PRN
  Administered 2023-06-30: 40 mg via INTRA_ARTICULAR

## 2023-06-30 NOTE — Progress Notes (Signed)
Jodi Duffy is well-known to me.  She has my formal next-door neighbor.  She has a history of a left knee replacement by Dr. Cleophas Dunker back in 2020.  He actually replaced her right knee remotely.  The right knee is doing well.  For last year she has had problems with her left knee in terms of getting down on her patella and a lot of pain around the patella.  She says has been tender to the touch and it is definitely affecting her activities of daily living.  Examination of her left knee shows no warmth.  There is no effusion.  She has good range of motion of the knee but there is a lot of pain all around the patella and the quad tendon.  X-rays of the left knee show well-seated total knee arthroplasty.  I see some calcifications at the inferior pole of patella that were not present immediately postoperative after her knee replacement.  The question is whether or not there is issues with the patellar component.  I did place a steroid injection in her left knee to see if this would calm down any inflammation.  We need to obtain a three-phase bone scan to rule out prosthetic loosening or any issues around the prosthesis or the patellar component.  We will see her back in 2 weeks to hopefully go over that bone scan with her.  She agrees with this treatment plan and did tolerate the steroid injection well.    Procedure Note  Patient: Jodi Duffy             Date of Birth: 1951-08-05           MRN: [id number]             Visit Date: 06/30/2023  Procedures: Visit Diagnoses:  1. Left knee pain, unspecified chronicity   2. History of total left knee replacement     Large Joint Inj: L knee on 06/30/2023 11:42 AM Indications: diagnostic evaluation and pain Details: 22 G 1.5 in needle, superolateral approach  Arthrogram: No  Medications: 3 mL lidocaine 1 %; 40 mg methylPREDNISolone acetate 40 MG/ML Outcome: tolerated well, no immediate complications Procedure, treatment alternatives, risks and  benefits explained, specific risks discussed. Consent was given by the patient. Immediately prior to procedure a time out was called to verify the correct patient, procedure, equipment, support staff and site/side marked as required. Patient was prepped and draped in the usual sterile fashion.

## 2023-07-01 ENCOUNTER — Ambulatory Visit: Payer: Medicare Other

## 2023-07-01 ENCOUNTER — Ambulatory Visit: Payer: Medicare Other | Admitting: Physical Therapy

## 2023-07-01 DIAGNOSIS — R2681 Unsteadiness on feet: Secondary | ICD-10-CM

## 2023-07-01 DIAGNOSIS — M6281 Muscle weakness (generalized): Secondary | ICD-10-CM | POA: Diagnosis not present

## 2023-07-01 NOTE — Therapy (Signed)
OUTPATIENT PHYSICAL THERAPY LOWER EXTREMITY EVALUATION   Patient Name: Jodi Duffy MRN: [id number] DOB:16-Sep-1951, 72 y.o., female Today's Date: 07/01/2023  END OF SESSION:  PT End of Session - 07/01/23 1404     Visit Number 3    Number of Visits 12    Date for PT Re-Evaluation 07/30/23    Authorization Type MCR/MCD    PT Start Time 1401    PT Stop Time 1445    PT Time Calculation (min) 44 min    Activity Tolerance Patient tolerated treatment well    Behavior During Therapy WFL for tasks assessed/performed               Past Medical History:  Diagnosis Date   Allergy    RHINITIS   Arthritis    COVID-19    Depression    Family history of cancer of female genital organ    Family history of lung cancer    Family history of throat cancer    Glaucoma    Hyperlipidemia    Hypertension    Osteopenia    TMJ syndrome    Past Surgical History:  Procedure Laterality Date   BIOPSY BREAST     BLEPHAROPLASTY     KNEE ARTHROSCOPY     right knee, 1990 and 2012, Dr. Nat Math JOINT SURGERY     TONSILLECTOMY     TOTAL KNEE ARTHROPLASTY  08/10/2012   Procedure: TOTAL KNEE ARTHROPLASTY;  Surgeon: Valeria Batman, MD;  Location: [facility];  Service: Orthopedics;  Laterality: Right;  Right Total Knee Arthroplasty   TOTAL KNEE ARTHROPLASTY Left 06/14/2019   Procedure: LEFT TOTAL KNEE ARTHROPLASTY;  Surgeon: Valeria Batman, MD;  Location: [facility];  Service: Orthopedics;  Laterality: Left;   Patient Active Problem List   Diagnosis Date Noted   History of cataract surgery, unspecified laterality 09/19/2021   Genetic testing 11/06/2020   Family history of throat cancer    Family history of lung cancer    Family history of cancer of female genital organ    Cotton wool spots 09/29/2018   Epidermoid cyst of skin 03/17/2018   Essential hypertension 02/10/2018   Other seasonal allergic rhinitis 11/15/2015   Family history of breast cancer in mother  09/06/2014   Dysthymia 09/06/2014   Glaucoma 09/06/2014   Hyperlipidemia LDL goal <100 09/06/2014   S/P TKR (total knee replacement) 08/12/2012   Family history of heart disease in female family member before age 54 07/22/2012    PCP: Ronnald Nian, MD  REFERRING PROVIDER: Joselyn Arrow, MD  REFERRING DIAG: Balance problems [R26.89]   THERAPY DIAG:  Unsteadiness on feet  Muscle weakness (generalized)  Rationale for Evaluation and Treatment: Rehabilitation  ONSET DATE: 3-4 years   SUBJECTIVE:   SUBJECTIVE STATEMENT: Jodi Duffy denies any recent falls. She recently had an injection in her Lt knee and states that it is feeling much better right now.   PERTINENT HISTORY: PMHx includes Arthritis, Depression, HTN, Osteopenia  4 year ago - Lt TKR  10 year ago - Rt TKR Are you having pain? No  PRECAUTIONS: None  RED FLAGS: None   WEIGHT BEARING RESTRICTIONS: No  FALLS:  Has patient fallen in last 6 months? Yes. Number of falls frequently near falls "then I stop myself."   LIVING ENVIRONMENT: Lives with: lives with their spouse Lives in: House/apartment Stairs: Yes: Internal: 12-15 steps; on right going up and External: 4 steps; on right going up Has following equipment at  home: None  OCCUPATION: retired   PLOF: Independent  PATIENT GOALS: "To work on my balance"   NEXT MD VISIT: 06/08/23  OBJECTIVE:    PATIENT SURVEYS:  FOTO 53 current, 57 predicted   COGNITION: Overall cognitive status: Within functional limits for tasks assessed     LOWER EXTREMITY MMT: to be assessed at follow up   MMT Right eval Left eval  Hip flexion 4+ 4+  Hip extension    Hip abduction 5 5  Hip adduction 5 5  Hip internal rotation 4+ 4+  Hip external rotation 4+ 4+  Knee flexion 5 5  Knee extension    Ankle dorsiflexion 5 5  Ankle plantarflexion    Ankle inversion    Ankle eversion     (Blank rows = not tested)   FUNCTIONAL TESTS:  Functional gait assessment: 14  total      Balance Tests Eval   Romberg on even ground  30 sec  Romberg on uneven ground  30 sec  Lt Tandem on even ground 15 sec Mod-max sway  Rt Tandem on even ground  3 sec  Romberg on even ground EC 30 sec   Romberg on uneven ground EC  30 sec  Mod-max sway    GAIT: Distance walked: 150 ft  Assistive device utilized: None Level of assistance: Complete Independence Comments: slow gait with wide BOS    TODAY'S TREATMENT:          static balance training on even/uneven surfaces, dynamic balance and gait training, balance with EC, balance with head nods/turns,   [facility] Adult PT Treatment:                                                DATE: 07/01/2023  Therapeutic Exercise: // bars: Walking Marches x 2 laps  Standing Heel-Toe Raises, 2 x 10  Sidestepping x 2 laps   Neuromuscular re-ed: //bars:  Semi Tandem stance, 2 x 30 sec each on airex Romberg EC, 2 x 30 sec on floor, 2 x 30 sec on airex  Romberg EO head turns/nods x1 min, x 1 min on airex  EC head turns/nods Rockerboard forward/back rocking x 1 min, hold x 1 min   Rockerboard lateral rocking x 1 min, hold x 1 min  Lateral Hurdles (3) Stepover with 1 airex pad at end x 3 laps Fwd Hurdles (3) Stepover 1airex pad at end x 2 laps Lt, x 2 laps Rt    [facility] Adult PT Treatment:                                                DATE: 06/19/2023  Therapeutic Exercise: // bars: Walking Marches x 2 laps  Standing Heel-Toe Raises, 2 x 10  Hurdles (2) Stepover with airex pad in between lateral x 3 laps Hurdles (2) Stepover fwd x 2 laps Lt/x 2 laps Rt NuStep level 4 x 5 minutes at end of session    Neuromuscular re-ed: //bars:  Tandem stance, 2 x 30 sec each on floor Romberg EC, 2 x 30 sec on floor, 2 x 30 sec on airex  Romberg EO head turns/nods x1 min on floor, x 1 min on airex  Rockerboard forward/back rocking x  1 min, hold x 1 min   Rockerboard lateral rocking x 1 min, hold x 1 min                                                                                                                  [facility] Adult PT Treatment:                                                DATE: 06/04/2023  Therapeutic Activity: Patient education as noted below     PATIENT EDUCATION:  Education details: discussion of POC, prognosis and goals for skilled PT; discussion of components of balance and her related deficits    Person educated: Patient Education method: Medical illustrator Education comprehension: verbalized understanding and needs further education  HOME EXERCISE PROGRAM: Access Code: [email or web address] URL: [email or web address] Date: 06/19/2023 Prepared by: Mauri Reading  Exercises - Standing Tandem Balance with Counter Support  - 2 x daily - 7 x weekly - 3 sets - 20-30 sec hold - Heel Toe Raises with Counter Support  - 2 x daily - 7 x weekly - 2 sets - 10 reps - Mini Squat with Counter Support  - 2 x daily - 7 x weekly - 2 sets - 10 reps - Standing Marching  - 2 x daily - 7 x weekly - 2 sets - 10 reps  ASSESSMENT:  CLINICAL IMPRESSION: Jodi Duffy was able to tolerate progression of balance activities, including increased time with eyes closed while on uneven surface. We will continue to progress with dynamic LE strength and balance activities in order to maximize overall safe and independent function.    OBJECTIVE IMPAIRMENTS: Abnormal gait, decreased activity tolerance, decreased balance, decreased endurance, decreased mobility, and decreased safety awareness.   ACTIVITY LIMITATIONS: carrying, lifting, bending, squatting, stairs, and locomotion level  PARTICIPATION LIMITATIONS: cleaning and shopping  PERSONAL FACTORS: Age, Past/current experiences, Time since onset of injury/illness/exacerbation, and 1-2 comorbidities: PMHx includes Arthritis, Depression, HTN, Osteopenia, BIL TKR  are also affecting patient's functional outcome.   REHAB POTENTIAL: Fair    CLINICAL DECISION MAKING:  Stable/uncomplicated  EVALUATION COMPLEXITY: Low   GOALS: Goals reviewed with patient? Yes  SHORT TERM GOALS: Target date: 07/02/2023    Patient will be independent with initial home program for balance retraining and LE strengthening as indicated.  Baseline: provided at eval  Goal status: INITIAL  2.  Patient will demonstrate improved Rt tandem stance to at least 15 sec.  Baseline: see objective measures  Goal status: INITIAL   LONG TERM GOALS: Target date: 07/30/2023   Patient will report improved overall functional ability with FOTO score of 57 or greater.  Baseline: 53 Goal status: INITIAL  2.  Patient will report less than 3 near falls over past 7 days.  Baseline: very frequent  Goal status: INITIAL  3.  Patient will demonstrate ability to perform at least 10  steps of tandem walking in order to have improved navigation of narrow spaces.  Baseline: 4-5 steps in a row.  Goal status: INITIAL  4.  Patient will demonstrate ability to safely pick up items from 1-10# without LOB.  Baseline: unsteady with bending forward.  Goal status: INITIAL  5.  Patient will demonstrate improved FGA score to at least 23 or greater in order to demonstrate decreased fall risk.  Baseline: 14 Goal status: INITIAL    PLAN:  PT FREQUENCY: 1-2x/week  PT DURATION: 8 weeks  PLANNED INTERVENTIONS: Therapeutic exercises, Therapeutic activity, Neuromuscular re-education, Balance training, Gait training, Patient/Family education, Self Care, Joint mobilization, Stair training, Manual therapy, and Re-evaluation  PLAN FOR NEXT SESSION: static balance training on even/uneven surfaces, dynamic balance and gait training, balance with EC, balance with head nods/turns,   Mauri Reading, PT, DPT 07/01/2023, 2:45 PM

## 2023-07-08 ENCOUNTER — Ambulatory Visit: Payer: Medicare Other

## 2023-07-08 DIAGNOSIS — M6281 Muscle weakness (generalized): Secondary | ICD-10-CM

## 2023-07-08 DIAGNOSIS — R2681 Unsteadiness on feet: Secondary | ICD-10-CM

## 2023-07-08 NOTE — Therapy (Signed)
OUTPATIENT PHYSICAL THERAPY LOWER EXTREMITY EVALUATION   Patient Name: Jodi Duffy MRN: [id number] DOB:1951-07-24, 72 y.o., female Today's Date: 07/08/2023  END OF SESSION:  PT End of Session - 07/08/23 1003     Visit Number 4    Number of Visits 12    Date for PT Re-Evaluation 07/30/23    Authorization Type MCR/MCD    PT Start Time 1005    PT Stop Time 1045    PT Time Calculation (min) 40 min    Activity Tolerance Patient tolerated treatment well    Behavior During Therapy WFL for tasks assessed/performed                Past Medical History:  Diagnosis Date   Allergy    RHINITIS   Arthritis    COVID-19    Depression    Family history of cancer of female genital organ    Family history of lung cancer    Family history of throat cancer    Glaucoma    Hyperlipidemia    Hypertension    Osteopenia    TMJ syndrome    Past Surgical History:  Procedure Laterality Date   BIOPSY BREAST     BLEPHAROPLASTY     KNEE ARTHROSCOPY     right knee, 1990 and 2012, Dr. Nat Math JOINT SURGERY     TONSILLECTOMY     TOTAL KNEE ARTHROPLASTY  08/10/2012   Procedure: TOTAL KNEE ARTHROPLASTY;  Surgeon: Valeria Batman, MD;  Location: [facility];  Service: Orthopedics;  Laterality: Right;  Right Total Knee Arthroplasty   TOTAL KNEE ARTHROPLASTY Left 06/14/2019   Procedure: LEFT TOTAL KNEE ARTHROPLASTY;  Surgeon: Valeria Batman, MD;  Location: [facility];  Service: Orthopedics;  Laterality: Left;   Patient Active Problem List   Diagnosis Date Noted   History of cataract surgery, unspecified laterality 09/19/2021   Genetic testing 11/06/2020   Family history of throat cancer    Family history of lung cancer    Family history of cancer of female genital organ    Cotton wool spots 09/29/2018   Epidermoid cyst of skin 03/17/2018   Essential hypertension 02/10/2018   Other seasonal allergic rhinitis 11/15/2015   Family history of breast cancer in mother  09/06/2014   Dysthymia 09/06/2014   Glaucoma 09/06/2014   Hyperlipidemia LDL goal <100 09/06/2014   S/P TKR (total knee replacement) 08/12/2012   Family history of heart disease in female family member before age 71 07/22/2012    PCP: Ronnald Nian, MD  REFERRING PROVIDER: Joselyn Arrow, MD  REFERRING DIAG: Balance problems [R26.89]   THERAPY DIAG:  Unsteadiness on feet  Muscle weakness (generalized)  Rationale for Evaluation and Treatment: Rehabilitation  ONSET DATE: 3-4 years   SUBJECTIVE:   SUBJECTIVE STATEMENT: Patient reporting no recent falls or LOB. She had a massage and states that her massage therapist said her L LE was more tight."   PERTINENT HISTORY: PMHx includes Arthritis, Depression, HTN, Osteopenia  4 year ago - Lt TKR  10 year ago - Rt TKR Are you having pain? No  PRECAUTIONS: None  RED FLAGS: None   WEIGHT BEARING RESTRICTIONS: No  FALLS:  Has patient fallen in last 6 months? Yes. Number of falls frequently near falls "then I stop myself."   LIVING ENVIRONMENT: Lives with: lives with their spouse Lives in: House/apartment Stairs: Yes: Internal: 12-15 steps; on right going up and External: 4 steps; on right going up Has following equipment  at home: None  OCCUPATION: retired   PLOF: Independent  PATIENT GOALS: "To work on my balance"   NEXT MD VISIT: 06/08/23  OBJECTIVE:    PATIENT SURVEYS:  FOTO 53 current, 57 predicted   COGNITION: Overall cognitive status: Within functional limits for tasks assessed     LOWER EXTREMITY MMT: to be assessed at follow up   MMT Right eval Left eval  Hip flexion 4+ 4+  Hip extension    Hip abduction 5 5  Hip adduction 5 5  Hip internal rotation 4+ 4+  Hip external rotation 4+ 4+  Knee flexion 5 5  Knee extension    Ankle dorsiflexion 5 5  Ankle plantarflexion    Ankle inversion    Ankle eversion     (Blank rows = not tested)   FUNCTIONAL TESTS:  Functional gait assessment: 14  total     Balance Tests Eval   Romberg on even ground  30 sec  Romberg on uneven ground  30 sec  Lt Tandem on even ground 15 sec Mod-max sway  Rt Tandem on even ground  3 sec  Romberg on even ground EC 30 sec   Romberg on uneven ground EC  30 sec  Mod-max sway    GAIT: Distance walked: 150 ft  Assistive device utilized: None Level of assistance: Complete Independence Comments: slow gait with wide BOS    TODAY'S TREATMENT:           [facility] Adult PT Treatment:                                                DATE: 07/08/2023  Neuromuscular re-ed: //bars:  Tandem stance on airex beam, 3 x 30 sec each LE Romberg EO head turns/nods x1 min, x 1 min on airex  Romberg EC head turns/nods x1 min, x 1 min on airex  Intermittent UE use to control swaying  Ball pass 1000g x 15 on airex  Ball overhead with head nods, 2 x 10 on airex   Therapeutic Activity:   Standing Heel-Toe Raises, 2 x 10  Standing 3-way hip, 2 x 5 each LE  // bars x 4 laps each  Obstacle course fwd: 3 hurdles, 1 airex, and 6" step  Lateral walking on airex with gap between airex beam and airex pad to encourage motor planning    [facility] Adult PT Treatment:                                                DATE: 07/01/2023  Therapeutic Exercise: // bars: Walking Marches x 2 laps  Standing Heel-Toe Raises, 2 x 10  Sidestepping x 2 laps   Neuromuscular re-ed: //bars:  Semi Tandem stance, 2 x 30 sec each on airex Romberg EC, 2 x 30 sec on floor, 2 x 30 sec on airex  Romberg EO head turns/nods x1 min, x 1 min on airex  EC head turns/nods Rockerboard forward/back rocking x 1 min, hold x 1 min   Rockerboard lateral rocking x 1 min, hold x 1 min  Lateral Hurdles (3) Stepover with 1 airex pad at end x 3 laps Fwd Hurdles (3) Stepover 1airex pad at end x 2 laps Lt, x 2  laps Rt    [facility] Adult PT Treatment:                                                DATE: 06/19/2023  Therapeutic Exercise: // bars: Walking Marches x 2  laps  Standing Heel-Toe Raises, 2 x 10  Hurdles (2) Stepover with airex pad in between lateral x 3 laps Hurdles (2) Stepover fwd x 2 laps Lt/x 2 laps Rt NuStep level 4 x 5 minutes at end of session    Neuromuscular re-ed: //bars:  Tandem stance, 2 x 30 sec each on floor Romberg EC, 2 x 30 sec on floor, 2 x 30 sec on airex  Romberg EO head turns/nods x1 min on floor, x 1 min on airex  Rockerboard forward/back rocking x 1 min, hold x 1 min   Rockerboard lateral rocking x 1 min, hold x 1 min                                                                                                                 [facility] Adult PT Treatment:                                                DATE: 06/04/2023  Therapeutic Activity: Patient education as noted below     PATIENT EDUCATION:  Education details: discussion of POC, prognosis and goals for skilled PT; discussion of components of balance and her related deficits    Person educated: Patient Education method: Explanation and Demonstration Education comprehension: verbalized understanding and needs further education  HOME EXERCISE PROGRAM: Access Code: [email or web address] URL: [email or web address] Date: 06/19/2023 Prepared by: Mauri Reading  Exercises - Standing Tandem Balance with Counter Support  - 2 x daily - 7 x weekly - 3 sets - 20-30 sec hold - Heel Toe Raises with Counter Support  - 2 x daily - 7 x weekly - 2 sets - 10 reps - Mini Squat with Counter Support  - 2 x daily - 7 x weekly - 2 sets - 10 reps - Standing Marching  - 2 x daily - 7 x weekly - 2 sets - 10 reps  ASSESSMENT:  CLINICAL IMPRESSION: Bhuvi has demonstrating improved balance overall, with less instance of LOB. She will benefit from increase challenge of dynamic balance including reactionary balance and navigating uneven surfaces.   OBJECTIVE IMPAIRMENTS: Abnormal gait, decreased activity tolerance, decreased balance, decreased endurance, decreased mobility, and  decreased safety awareness.   ACTIVITY LIMITATIONS: carrying, lifting, bending, squatting, stairs, and locomotion level  PARTICIPATION LIMITATIONS: cleaning and shopping  PERSONAL FACTORS: Age, Past/current experiences, Time since onset of injury/illness/exacerbation, and 1-2 comorbidities: PMHx includes Arthritis, Depression, HTN, Osteopenia, BIL TKR  are also affecting patient's functional outcome.   REHAB POTENTIAL:  Fair    CLINICAL DECISION MAKING: Stable/uncomplicated  EVALUATION COMPLEXITY: Low   GOALS: Goals reviewed with patient? Yes  SHORT TERM GOALS: Target date: 07/02/2023    Patient will be independent with initial home program for balance retraining and LE strengthening as indicated.  Baseline: provided at eval  Goal status: INITIAL  2.  Patient will demonstrate improved Rt tandem stance to at least 15 sec.  Baseline: see objective measures  Goal status: INITIAL   LONG TERM GOALS: Target date: 07/30/2023   Patient will report improved overall functional ability with FOTO score of 57 or greater.  Baseline: 53 Goal status: INITIAL  2.  Patient will report less than 3 near falls over past 7 days.  Baseline: very frequent  Goal status: INITIAL  3.  Patient will demonstrate ability to perform at least 10 steps of tandem walking in order to have improved navigation of narrow spaces.  Baseline: 4-5 steps in a row.  Goal status: INITIAL  4.  Patient will demonstrate ability to safely pick up items from 1-10# without LOB.  Baseline: unsteady with bending forward.  Goal status: INITIAL  5.  Patient will demonstrate improved FGA score to at least 23 or greater in order to demonstrate decreased Duffy risk.  Baseline: 14 Goal status: INITIAL    PLAN:  PT FREQUENCY: 1-2x/week  PT DURATION: 8 weeks  PLANNED INTERVENTIONS: Therapeutic exercises, Therapeutic activity, Neuromuscular re-education, Balance training, Gait training, Patient/Family education, Self  Care, Joint mobilization, Stair training, Manual therapy, and Re-evaluation  PLAN FOR NEXT SESSION: static balance training on even/uneven surfaces, dynamic balance and gait training, balance with EC, balance with head nods/turns,   Mauri Reading, PT, DPT 07/08/2023, 11:05 AM

## 2023-07-09 ENCOUNTER — Encounter (HOSPITAL_COMMUNITY)
Admission: RE | Admit: 2023-07-09 | Discharge: 2023-07-09 | Disposition: A | Discharge status: Home / Self Care | Payer: Medicare Other | Source: Ambulatory Visit | Attending: Orthopaedic Surgery | Admitting: Orthopaedic Surgery

## 2023-07-09 DIAGNOSIS — Z96652 Presence of left artificial knee joint: Secondary | ICD-10-CM | POA: Insufficient documentation

## 2023-07-09 MED ORDER — TECHNETIUM TC 99M MEDRONATE IV KIT
20.0000 | PACK | Freq: Once | INTRAVENOUS | Status: AC | PRN
  Administered 2023-07-09: 20 via INTRAVENOUS

## 2023-07-14 ENCOUNTER — Ambulatory Visit: Payer: Medicare Other | Admitting: Orthopaedic Surgery

## 2023-07-14 DIAGNOSIS — H44113 Panuveitis, bilateral: Secondary | ICD-10-CM | POA: Diagnosis not present

## 2023-07-14 DIAGNOSIS — H43821 Vitreomacular adhesion, right eye: Secondary | ICD-10-CM | POA: Diagnosis not present

## 2023-07-14 DIAGNOSIS — H30033 Focal chorioretinal inflammation, peripheral, bilateral: Secondary | ICD-10-CM | POA: Diagnosis not present

## 2023-07-14 DIAGNOSIS — Z79899 Other long term (current) drug therapy: Secondary | ICD-10-CM | POA: Diagnosis not present

## 2023-07-14 DIAGNOSIS — Z961 Presence of intraocular lens: Secondary | ICD-10-CM | POA: Diagnosis not present

## 2023-07-15 ENCOUNTER — Encounter: Payer: Self-pay | Admitting: Orthopaedic Surgery

## 2023-07-15 ENCOUNTER — Ambulatory Visit: Payer: Medicare Other

## 2023-07-15 ENCOUNTER — Ambulatory Visit: Payer: Medicare Other | Admitting: Orthopaedic Surgery

## 2023-07-15 DIAGNOSIS — R2681 Unsteadiness on feet: Secondary | ICD-10-CM

## 2023-07-15 DIAGNOSIS — Z96652 Presence of left artificial knee joint: Secondary | ICD-10-CM | POA: Diagnosis not present

## 2023-07-15 DIAGNOSIS — M6281 Muscle weakness (generalized): Secondary | ICD-10-CM

## 2023-07-15 NOTE — Therapy (Signed)
OUTPATIENT PHYSICAL THERAPY TREATMENT NOTE  Patient Name: Jodi Duffy MRN: [id number] DOB:June 13, 1951, 72 y.o., female Today's Date: 07/15/2023  END OF SESSION:  PT End of Session - 07/15/23 1126     Visit Number 5    Number of Visits 12    Date for PT Re-Evaluation 07/30/23    Authorization Type MCR/MCD    PT Start Time 1130    PT Stop Time 1208    PT Time Calculation (min) 38 min    Activity Tolerance Patient tolerated treatment well    Behavior During Therapy WFL for tasks assessed/performed             Past Medical History:  Diagnosis Date   Allergy    RHINITIS   Arthritis    COVID-19    Depression    Family history of cancer of female genital organ    Family history of lung cancer    Family history of throat cancer    Glaucoma    Hyperlipidemia    Hypertension    Osteopenia    TMJ syndrome    Past Surgical History:  Procedure Laterality Date   BIOPSY BREAST     BLEPHAROPLASTY     KNEE ARTHROSCOPY     right knee, 1990 and 2012, Dr. Nat Math JOINT SURGERY     TONSILLECTOMY     TOTAL KNEE ARTHROPLASTY  08/10/2012   Procedure: TOTAL KNEE ARTHROPLASTY;  Surgeon: Valeria Batman, MD;  Location: [facility];  Service: Orthopedics;  Laterality: Right;  Right Total Knee Arthroplasty   TOTAL KNEE ARTHROPLASTY Left 06/14/2019   Procedure: LEFT TOTAL KNEE ARTHROPLASTY;  Surgeon: Valeria Batman, MD;  Location: [facility];  Service: Orthopedics;  Laterality: Left;   Patient Active Problem List   Diagnosis Date Noted   History of cataract surgery, unspecified laterality 09/19/2021   Genetic testing 11/06/2020   Family history of throat cancer    Family history of lung cancer    Family history of cancer of female genital organ    Cotton wool spots 09/29/2018   Epidermoid cyst of skin 03/17/2018   Essential hypertension 02/10/2018   Other seasonal allergic rhinitis 11/15/2015   Family history of breast cancer in mother 09/06/2014   Dysthymia  09/06/2014   Glaucoma 09/06/2014   Hyperlipidemia LDL goal <100 09/06/2014   S/P TKR (total knee replacement) 08/12/2012   Family history of heart disease in female family member before age 14 07/22/2012    PCP: Ronnald Nian, MD  REFERRING PROVIDER: Joselyn Arrow, MD  REFERRING DIAG: Balance problems [R26.89]   THERAPY DIAG:  Unsteadiness on feet  Muscle weakness (generalized)  Rationale for Evaluation and Treatment: Rehabilitation  ONSET DATE: 3-4 years   SUBJECTIVE:   SUBJECTIVE STATEMENT: Patient reports that she felt like her knee buckled on her 3x yesterday, no falls.  PERTINENT HISTORY: PMHx includes Arthritis, Depression, HTN, Osteopenia  4 year ago - Lt TKR  10 year ago - Rt TKR Are you having pain? No  PRECAUTIONS: None  RED FLAGS: None   WEIGHT BEARING RESTRICTIONS: No  FALLS:  Has patient fallen in last 6 months? Yes. Number of falls frequently near falls "then I stop myself."   LIVING ENVIRONMENT: Lives with: lives with their spouse Lives in: House/apartment Stairs: Yes: Internal: 12-15 steps; on right going up and External: 4 steps; on right going up Has following equipment at home: None  OCCUPATION: retired   PLOF: Independent  PATIENT GOALS: "To work  on my balance"   NEXT MD VISIT: 06/08/23  OBJECTIVE:    PATIENT SURVEYS:  FOTO 53 current, 57 predicted   COGNITION: Overall cognitive status: Within functional limits for tasks assessed     LOWER EXTREMITY MMT: to be assessed at follow up   MMT Right eval Left eval  Hip flexion 4+ 4+  Hip extension    Hip abduction 5 5  Hip adduction 5 5  Hip internal rotation 4+ 4+  Hip external rotation 4+ 4+  Knee flexion 5 5  Knee extension    Ankle dorsiflexion 5 5  Ankle plantarflexion    Ankle inversion    Ankle eversion     (Blank rows = not tested)   FUNCTIONAL TESTS:  Functional gait assessment: 14 total     Balance Tests Eval   Romberg on even ground  30 sec  Romberg on  uneven ground  30 sec  Lt Tandem on even ground 15 sec Mod-max sway  Rt Tandem on even ground  3 sec  Romberg on even ground EC 30 sec   Romberg on uneven ground EC  30 sec  Mod-max sway    GAIT: Distance walked: 150 ft  Assistive device utilized: None Level of assistance: Complete Independence Comments: slow gait with wide BOS    TODAY'S TREATMENT:         [facility] Adult PT Treatment:                                                DATE: 07/15/23 Neuromuscular re-ed: //bars:  Tandem stance on floor x30" BIL Romberg EO head turns/nod x 1 min on airex  Romberg EC head turns/nods x 1 min on airex  Intermittent UE use to control swaying  Ball pass 1000g x 15 on airex  Ball overhead with head nods, 2 x 10 on airex  SLS with circles on orange 5000g ball x30" CW/CCW BIL - fingertip support Therapeutic Activity:  Step ups 6" x10 fwd/lat BIL Sidestepping x 4 laps Standing Heel-Toe Raises, 2 x 10  Standing 3-way hip, 2 x 5 each LE  // bars x 2 laps each  Obstacle course fwd: 3 hurdles, balance beam - occasional fingertip support to maintain balance  [facility] Adult PT Treatment:                                                DATE: 07/08/2023 Neuromuscular re-ed: //bars:  Tandem stance on airex beam, 3 x 30 sec each LE Romberg EO head turns/nods x1 min, x 1 min on airex  Romberg EC head turns/nods x1 min, x 1 min on airex  Intermittent UE use to control swaying  Ball pass 1000g x 15 on airex  Ball overhead with head nods, 2 x 10 on airex  Therapeutic Activity:  Standing Heel-Toe Raises, 2 x 10  Standing 3-way hip, 2 x 5 each LE  // bars x 4 laps each  Obstacle course fwd: 3 hurdles, 1 airex, and 6" step  Lateral walking on airex with gap between airex beam and airex pad to encourage motor planning    [facility] Adult PT Treatment:  DATE: 07/01/2023  Therapeutic Exercise: // bars: Walking Marches x 2 laps  Standing Heel-Toe Raises, 2 x 10   Sidestepping x 2 laps   Neuromuscular re-ed: //bars:  Semi Tandem stance, 2 x 30 sec each on airex Romberg EC, 2 x 30 sec on floor, 2 x 30 sec on airex  Romberg EO head turns/nods x1 min, x 1 min on airex  EC head turns/nods Rockerboard forward/back rocking x 1 min, hold x 1 min   Rockerboard lateral rocking x 1 min, hold x 1 min  Lateral Hurdles (3) Stepover with 1 airex pad at end x 3 laps Fwd Hurdles (3) Stepover 1airex pad at end x 2 laps Lt, x 2 laps Rt     PATIENT EDUCATION:  Education details: discussion of POC, prognosis and goals for skilled PT; discussion of components of balance and her related deficits    Person educated: Patient Education method: Medical illustrator Education comprehension: verbalized understanding and needs further education  HOME EXERCISE PROGRAM: Access Code: [email or web address] URL: [email or web address] Date: 06/19/2023 Prepared by: Mauri Reading  Exercises - Standing Tandem Balance with Counter Support  - 2 x daily - 7 x weekly - 3 sets - 20-30 sec hold - Heel Toe Raises with Counter Support  - 2 x daily - 7 x weekly - 2 sets - 10 reps - Mini Squat with Counter Support  - 2 x daily - 7 x weekly - 2 sets - 10 reps - Standing Marching  - 2 x daily - 7 x weekly - 2 sets - 10 reps  ASSESSMENT:  CLINICAL IMPRESSION: Patient presents to PT reporting 3 incidences yesterday of her Lt knee buckling. Session today continued to focus on dynamic balance tasks on stable and uneven surfaces to challenge proprioception decrease fall risk. Patient was able to tolerate all prescribed exercises with no adverse effects. Patient continues to benefit from skilled PT services and should be progressed as able to improve functional independence.    OBJECTIVE IMPAIRMENTS: Abnormal gait, decreased activity tolerance, decreased balance, decreased endurance, decreased mobility, and decreased safety awareness.   ACTIVITY LIMITATIONS: carrying, lifting,  bending, squatting, stairs, and locomotion level  PARTICIPATION LIMITATIONS: cleaning and shopping  PERSONAL FACTORS: Age, Past/current experiences, Time since onset of injury/illness/exacerbation, and 1-2 comorbidities: PMHx includes Arthritis, Depression, HTN, Osteopenia, BIL TKR  are also affecting patient's functional outcome.   REHAB POTENTIAL: Fair    CLINICAL DECISION MAKING: Stable/uncomplicated  EVALUATION COMPLEXITY: Low   GOALS: Goals reviewed with patient? Yes  SHORT TERM GOALS: Target date: 07/02/2023    Patient will be independent with initial home program for balance retraining and LE strengthening as indicated.  Baseline: provided at eval  Goal status: Ongoing Pt reports occasional compliance 07/15/23  2.  Patient will demonstrate improved Rt tandem stance to at least 15 sec.  Baseline: see objective measures  Goal status: MET    LONG TERM GOALS: Target date: 07/30/2023   Patient will report improved overall functional ability with FOTO score of 57 or greater.  Baseline: 53 Goal status: INITIAL  2.  Patient will report less than 3 near falls over past 7 days.  Baseline: very frequent  Goal status: INITIAL  3.  Patient will demonstrate ability to perform at least 10 steps of tandem walking in order to have improved navigation of narrow spaces.  Baseline: 4-5 steps in a row.  Goal status: INITIAL  4.  Patient will demonstrate ability to safely pick up items from 1-10# without LOB.  Baseline: unsteady with bending forward.  Goal status: INITIAL  5.  Patient will demonstrate improved FGA score to at least 23 or greater in order to demonstrate decreased fall risk.  Baseline: 14 Goal status: INITIAL    PLAN:  PT FREQUENCY: 1-2x/week  PT DURATION: 8 weeks  PLANNED INTERVENTIONS: Therapeutic exercises, Therapeutic activity, Neuromuscular re-education, Balance training, Gait training, Patient/Family education, Self Care, Joint mobilization, Stair  training, Manual therapy, and Re-evaluation  PLAN FOR NEXT SESSION: static balance training on even/uneven surfaces, dynamic balance and gait training, balance with EC, balance with head nods/turns,   Berta Minor, PTA 07/15/2023, 12:09 PM

## 2023-07-15 NOTE — Progress Notes (Signed)
Jodi Duffy comes in today to go over a three-phase bone scan to rule out prosthetic loosening.  She has a history of both her knees being replaced by Dr. Cleophas Dunker sometime ago.  She has never had any issues with the right knee but her left knee has been having some pain around the patella and asked what brought her in recently for me to see her for a left knee.  Her x-rays did not show any gross findings or worrisome findings.  There was no knee joint effusion but there was a little bit of calcifications just inferior to the pole the patella that were not present immediately after surgery.  Some of this is just chronic changes from surgery.  However I did send her for a three-phase bone scan to rule out prosthetic loosening.  She is here to go over that scan today.  She does report that she has episodes where she feels like that left knee is unstable and just gives out on her.  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  She is getting ready to travel out of the country for 2 weeks.  Examination of her left knee today shows no effusion at all.  The knee is nice and straight.  The knee feels ligamentously stable to me throughout the arc of motion.  The patella seems to track well.  I did examine her right knee and there is actually slight laxity in the right knee but that is not bothersome to her like the left knee is.  The three-phase bone scan did not show any evidence of prosthetic loosening.  I would actually like to send her to my partner Dr. August Saucer for a second opinion as it relates to her her left knee given the symptoms that she feels as if that knee is giving out on her.  She may end up needing upsizing of her polyliner but I am really not sure based on the my examination of her knee.  For her trip I have recommended she at least try a knee sleeve like a copper fit knee sleeve just to give her some stability while she is on a trip to [place].  We will work on getting an  appointment with Dr. August Saucer for his opinion.  She agrees with this as well.

## 2023-07-16 ENCOUNTER — Encounter: Payer: Self-pay | Admitting: Orthopedic Surgery

## 2023-07-16 ENCOUNTER — Ambulatory Visit: Payer: Medicare Other | Admitting: Orthopedic Surgery

## 2023-07-16 DIAGNOSIS — Z96652 Presence of left artificial knee joint: Secondary | ICD-10-CM | POA: Diagnosis not present

## 2023-07-16 NOTE — Progress Notes (Signed)
Office Visit Note   Patient: Jodi Duffy           Date of Birth: [birth year]           MRN: [id number] Visit Date: 07/16/2023 Requested by: Ronnald Nian, MD [address] [place],  [place] [address] PCP: Ronnald Nian, MD  Subjective: Chief Complaint  Patient presents with   Left Knee - Pain    2nd opinion    HPI: Jodi Duffy is a 72 y.o. female who presents to the office reporting left knee giving way.  She is here for second opinion.  Has a history of left knee replacement cemented cruciate retaining done about 5 years ago.  She really does not report any pain but she states that the knee does give way with walking.  Stairs are actually okay for her.  Right leg is doing well which has had a knee replacement done about 7 or 8 years ago.  She is going to [place] in 2 weeks for a vacation which does not involve a lot of walking.  Symptoms ongoing for 4 weeks.  She is going to physical therapy for strengthening.  She does also workout with a trainer.  Has not tried a brace yet.  Has not fallen but that is a concern.  Of hers..                ROS: All systems reviewed are negative as they relate to the chief complaint within the history of present illness.  Patient denies fevers or chills.  Assessment & Plan: Visit Diagnoses: No diagnosis found.  Plan: Impression is left knee giving way with likely attritional wear of the PCL increasing her flexion gap and giving her mid flexion instability.  Radiographically the knee appears intact.  There is some tracer uptake from the bone scan along the medial aspect of that left tibial plateau which appears to be more related to cement mantle irregularity as opposed to actual loosening.  Whether or not a bigger spacer will solve this problem is debatable.  She may need revision to a more constrained knee.  However it is also possible that with a lot of quad strengthening that the giving way could improved to the point where extensive  revision surgery is not necessary.  She will follow-up with [place] as needed.  Follow-Up Instructions: No follow-ups on file.   Orders:  No orders of the defined types were placed in this encounter.  No orders of the defined types were placed in this encounter.     Procedures: No procedures performed   Clinical Data: No additional findings.  Objective: Vital Signs: There were no vitals taken for this visit.  Physical Exam:  Constitutional: Patient appears well-developed HEENT:  Head: Normocephalic Eyes:EOM are normal Neck: Normal range of motion Cardiovascular: Normal rate Pulmonary/chest: Effort normal Neurologic: Patient is alert Skin: Skin is warm Psychiatric: Patient has normal mood and affect  Ortho Exam: Ortho exam demonstrates normal gait alignment.  Right left knee has no warmth or effusion.  Collaterals are stable.  She does have more flexion laxity on the left compared to the right.  With the knee flexed and relaxed and suspended the tibia has to engage and moved proximally before actually extending and that is much more prominent on the left than the right.  Patella is stable.  No nerve root tension signs.  Specialty Comments:  No specialty comments available.  Imaging: No results found.   PMFS History:  Patient Active Problem List   Diagnosis Date Noted   History of cataract surgery, unspecified laterality 09/19/2021   Genetic testing 11/06/2020   Family history of throat cancer    Family history of lung cancer    Family history of cancer of female genital organ    Cotton wool spots 09/29/2018   Epidermoid cyst of skin 03/17/2018   Essential hypertension 02/10/2018   Other seasonal allergic rhinitis 11/15/2015   Family history of breast cancer in mother 09/06/2014   Dysthymia 09/06/2014   Glaucoma 09/06/2014   Hyperlipidemia LDL goal <100 09/06/2014   S/P TKR (total knee replacement) 08/12/2012   Family history of heart disease in female family member  before age 9 07/22/2012   Past Medical History:  Diagnosis Date   Allergy    RHINITIS   Arthritis    COVID-19    Depression    Family history of cancer of female genital organ    Family history of lung cancer    Family history of throat cancer    Glaucoma    Hyperlipidemia    Hypertension    Osteopenia    TMJ syndrome     Family History  Problem Relation Age of Onset   Heart disease Mother        valve replacement   Breast cancer Mother 40   Throat cancer Mother 7       smoker   Heart disease Father        died of brain embolism after hip surgery   Arthritis Father    Arthritis Brother    Breast cancer Maternal Aunt        dx in her 50s   Lung cancer Maternal Aunt        dx in her 50s/60s, non-smoker   Vaginal cancer Maternal Aunt        dx in her 45s   Breast cancer Other        dx in her 93s, maternal first cousin's daughter   Diabetes Neg Hx    Hypertension Neg Hx    Stroke Neg Hx    Colon cancer Neg Hx     Past Surgical History:  Procedure Laterality Date   BIOPSY BREAST     BLEPHAROPLASTY     KNEE ARTHROSCOPY     right knee, 1990 and 2012, Dr. Nat Math JOINT SURGERY     TONSILLECTOMY     TOTAL KNEE ARTHROPLASTY  08/10/2012   Procedure: TOTAL KNEE ARTHROPLASTY;  Surgeon: Valeria Batman, MD;  Location: [facility];  Service: Orthopedics;  Laterality: Right;  Right Total Knee Arthroplasty   TOTAL KNEE ARTHROPLASTY Left 06/14/2019   Procedure: LEFT TOTAL KNEE ARTHROPLASTY;  Surgeon: Valeria Batman, MD;  Location: [facility];  Service: Orthopedics;  Laterality: Left;   Social History   Occupational History   Occupation: Programme researcher, broadcasting/film/video: [employer]  Tobacco Use   Smoking status: Former    Current packs/day: 0.00    Average packs/day: 1 pack/day for 12.0 years (12.0 ttl pk-yrs)    Types: Cigarettes    Start date: 02/10/1968    Quit date: 02/10/1980    Years since quitting: 43.4   Smokeless tobacco: Never  Vaping Use    Vaping status: Never Used  Substance and Sexual Activity   Alcohol use: Yes    Alcohol/week: 2.0 standard drinks of alcohol    Types: 2 Glasses of wine per week    Comment:  daily   Drug use: No   Sexual activity: Yes

## 2023-07-18 ENCOUNTER — Encounter: Payer: Self-pay | Admitting: Orthopedic Surgery

## 2023-07-22 ENCOUNTER — Ambulatory Visit: Payer: Medicare Other | Admitting: Family Medicine

## 2023-07-22 ENCOUNTER — Ambulatory Visit: Payer: Medicare Other | Attending: Family Medicine

## 2023-07-22 DIAGNOSIS — R2681 Unsteadiness on feet: Secondary | ICD-10-CM | POA: Insufficient documentation

## 2023-07-22 DIAGNOSIS — Z23 Encounter for immunization: Secondary | ICD-10-CM | POA: Diagnosis not present

## 2023-07-22 DIAGNOSIS — M6281 Muscle weakness (generalized): Secondary | ICD-10-CM | POA: Diagnosis not present

## 2023-07-22 NOTE — Therapy (Signed)
OUTPATIENT PHYSICAL THERAPY TREATMENT NOTE  Patient Name: Jodi Duffy MRN: [id number] DOB:1951/09/17, 72 y.o., female Today's Date: 07/22/2023  END OF SESSION:  PT End of Session - 07/22/23 1145     Visit Number 6    Number of Visits 12    Date for PT Re-Evaluation 07/30/23    Authorization Type MCR/MCD    PT Start Time 1145    PT Stop Time 1225    PT Time Calculation (min) 40 min    Activity Tolerance Patient tolerated treatment well    Behavior During Therapy WFL for tasks assessed/performed              Past Medical History:  Diagnosis Date   Allergy    RHINITIS   Arthritis    COVID-19    Depression    Family history of cancer of female genital organ    Family history of lung cancer    Family history of throat cancer    Glaucoma    Hyperlipidemia    Hypertension    Osteopenia    TMJ syndrome    Past Surgical History:  Procedure Laterality Date   BIOPSY BREAST     BLEPHAROPLASTY     KNEE ARTHROSCOPY     right knee, 1990 and 2012, Dr. Nat Math JOINT SURGERY     TONSILLECTOMY     TOTAL KNEE ARTHROPLASTY  08/10/2012   Procedure: TOTAL KNEE ARTHROPLASTY;  Surgeon: Valeria Batman, MD;  Location: [facility];  Service: Orthopedics;  Laterality: Right;  Right Total Knee Arthroplasty   TOTAL KNEE ARTHROPLASTY Left 06/14/2019   Procedure: LEFT TOTAL KNEE ARTHROPLASTY;  Surgeon: Valeria Batman, MD;  Location: [facility];  Service: Orthopedics;  Laterality: Left;   Patient Active Problem List   Diagnosis Date Noted   History of cataract surgery, unspecified laterality 09/19/2021   Genetic testing 11/06/2020   Family history of throat cancer    Family history of lung cancer    Family history of cancer of female genital organ    Cotton wool spots 09/29/2018   Epidermoid cyst of skin 03/17/2018   Essential hypertension 02/10/2018   Other seasonal allergic rhinitis 11/15/2015   Family history of breast cancer in mother 09/06/2014   Dysthymia  09/06/2014   Glaucoma 09/06/2014   Hyperlipidemia LDL goal <100 09/06/2014   S/P TKR (total knee replacement) 08/12/2012   Family history of heart disease in female family member before age 13 07/22/2012    PCP: Ronnald Nian, MD  REFERRING PROVIDER: Joselyn Arrow, MD  REFERRING DIAG: Balance problems [R26.89]   THERAPY DIAG:  Unsteadiness on feet  Muscle weakness (generalized)  Rationale for Evaluation and Treatment: Rehabilitation  ONSET DATE: 3-4 years   SUBJECTIVE:   SUBJECTIVE STATEMENT:  Patient reports that she picked up a knee brace/sleeve yesterday. She noted some pain and difficulty with kneeling.   PERTINENT HISTORY: PMHx includes Arthritis, Depression, HTN, Osteopenia  4 year ago - Lt TKR  10 year ago - Rt TKR Are you having pain? No  PRECAUTIONS: None  RED FLAGS: None   WEIGHT BEARING RESTRICTIONS: No  FALLS:  Has patient fallen in last 6 months? Yes. Number of falls frequently near falls "then I stop myself."   LIVING ENVIRONMENT: Lives with: lives with their spouse Lives in: House/apartment Stairs: Yes: Internal: 12-15 steps; on right going up and External: 4 steps; on right going up Has following equipment at home: None  OCCUPATION: retired   PLOF:  Independent  PATIENT GOALS: "To work on my balance"   NEXT MD VISIT: 06/08/23  OBJECTIVE:    PATIENT SURVEYS:  FOTO 53 current, 57 predicted   COGNITION: Overall cognitive status: Within functional limits for tasks assessed     LOWER EXTREMITY MMT: to be assessed at follow up   MMT Right eval Left eval  Hip flexion 4+ 4+  Hip extension    Hip abduction 5 5  Hip adduction 5 5  Hip internal rotation 4+ 4+  Hip external rotation 4+ 4+  Knee flexion 5 5  Knee extension    Ankle dorsiflexion 5 5  Ankle plantarflexion    Ankle inversion    Ankle eversion     (Blank rows = not tested)   FUNCTIONAL TESTS:  Functional gait assessment: 14 total     Balance Tests Eval   Romberg  on even ground  30 sec  Romberg on uneven ground  30 sec  Lt Tandem on even ground 15 sec Mod-max sway  Rt Tandem on even ground  3 sec  Romberg on even ground EC 30 sec   Romberg on uneven ground EC  30 sec  Mod-max sway    GAIT: Distance walked: 150 ft  Assistive device utilized: None Level of assistance: Complete Independence Comments: slow gait with wide BOS    TODAY'S TREATMENT:          [facility] Adult PT Treatment:                                                DATE: 07/22/23 Neuromuscular re-ed: //bars:  Tandem stance on floor x30" BIL Romberg EO head turns/nod x 1 min on airex  Romberg EC head turns/nods x 1 min on airex  Intermittent UE use to control swaying  Ball pass 1000g x 15 on airex  Ball overhead with head nods, 2 x 10 on airex  SLS with circles on orange 5000g ball x30" CW/CCW BIL - fingertip support  Therapeutic Activity:  Step ups 6" x10 fwd/lat BIL Sidestepping x 4 laps Standing Heel-Toe Raises, 2 x 10  Standing 3-way hip, x 10 each LE  // bars x 2 laps each  Obstacle course fwd: 3 hurdles, balance beam - occasional fingertip support to maintain balance   [facility] Adult PT Treatment:                                                DATE: 07/08/2023 Neuromuscular re-ed: //bars:  Tandem stance on airex beam, 3 x 30 sec each LE Romberg EO head turns/nods x1 min, x 1 min on airex  Romberg EC head turns/nods x1 min, x 1 min on airex  Intermittent UE use to control swaying  Ball pass 1000g x 15 on airex  Ball overhead with head nods, 2 x 10 on airex  Therapeutic Activity:  Standing Heel-Toe Raises, 2 x 10  Standing 3-way hip, 2 x 5 each LE  // bars x 4 laps each  Obstacle course fwd: 3 hurdles, 1 airex, and 6" step  Lateral walking on airex with gap between airex beam and airex pad to encourage motor planning     PATIENT EDUCATION:  Education details: discussion of POC, prognosis  and goals for skilled PT; discussion of components of balance and her related  deficits    Person educated: Patient Education method: Explanation and Demonstration Education comprehension: verbalized understanding and needs further education  HOME EXERCISE PROGRAM: Access Code: [email or web address] URL: [email or web address] Date: 06/19/2023 Prepared by: Mauri Reading  Exercises - Standing Tandem Balance with Counter Support  - 2 x daily - 7 x weekly - 3 sets - 20-30 sec hold - Heel Toe Raises with Counter Support  - 2 x daily - 7 x weekly - 2 sets - 10 reps - Mini Squat with Counter Support  - 2 x daily - 7 x weekly - 2 sets - 10 reps - Standing Marching  - 2 x daily - 7 x weekly - 2 sets - 10 reps  ASSESSMENT:  CLINICAL IMPRESSION: Patient is demonstrating good improvement with stair navigation and balance recovery today. She still benefits from some cueing for improved overall strategy. Plan is to updated home exercise program at next visit and include LE strengthening activities in preparation for 3 week break from PT d/t travel.      OBJECTIVE IMPAIRMENTS: Abnormal gait, decreased activity tolerance, decreased balance, decreased endurance, decreased mobility, and decreased safety awareness.   ACTIVITY LIMITATIONS: carrying, lifting, bending, squatting, stairs, and locomotion level  PARTICIPATION LIMITATIONS: cleaning and shopping  PERSONAL FACTORS: Age, Past/current experiences, Time since onset of injury/illness/exacerbation, and 1-2 comorbidities: PMHx includes Arthritis, Depression, HTN, Osteopenia, BIL TKR  are also affecting patient's functional outcome.   REHAB POTENTIAL: Fair    CLINICAL DECISION MAKING: Stable/uncomplicated  EVALUATION COMPLEXITY: Low   GOALS: Goals reviewed with patient? Yes  SHORT TERM GOALS: Target date: 07/02/2023    Patient will be independent with initial home program for balance retraining and LE strengthening as indicated.  Baseline: provided at eval  Goal status: Ongoing Pt reports occasional compliance  07/15/23  2.  Patient will demonstrate improved Rt tandem stance to at least 15 sec.  Baseline: see objective measures  Goal status: MET    LONG TERM GOALS: Target date: 07/30/2023   Patient will report improved overall functional ability with FOTO score of 57 or greater.  Baseline: 53 Goal status: INITIAL  2.  Patient will report less than 3 near falls over past 7 days.  Baseline: very frequent  Goal status: INITIAL  3.  Patient will demonstrate ability to perform at least 10 steps of tandem walking in order to have improved navigation of narrow spaces.  Baseline: 4-5 steps in a row.  Goal status: INITIAL  4.  Patient will demonstrate ability to safely pick up items from 1-10# without LOB.  Baseline: unsteady with bending forward.  Goal status: INITIAL  5.  Patient will demonstrate improved FGA score to at least 23 or greater in order to demonstrate decreased fall risk.  Baseline: 14 Goal status: INITIAL    PLAN:  PT FREQUENCY: 1-2x/week  PT DURATION: 8 weeks  PLANNED INTERVENTIONS: Therapeutic exercises, Therapeutic activity, Neuromuscular re-education, Balance training, Gait training, Patient/Family education, Self Care, Joint mobilization, Stair training, Manual therapy, and Re-evaluation  PLAN FOR NEXT SESSION: static balance training on even/uneven surfaces, dynamic balance and gait training, balance with EC, balance with head nods/turns,   Mauri Reading, PT, DPT 07/22/2023, 1:00 PM

## 2023-07-24 ENCOUNTER — Ambulatory Visit: Payer: Medicare Other

## 2023-07-24 DIAGNOSIS — M6281 Muscle weakness (generalized): Secondary | ICD-10-CM

## 2023-07-24 DIAGNOSIS — R2681 Unsteadiness on feet: Secondary | ICD-10-CM

## 2023-07-24 NOTE — Therapy (Signed)
OUTPATIENT PHYSICAL THERAPY TREATMENT NOTE  Patient Name: Jodi Duffy MRN: [id number] DOB:February 13, 1951, 72 y.o., female Today's Date: 07/25/2023  END OF SESSION:  PT End of Session - 07/24/23 1006     Visit Number 7    Number of Visits 12    Date for PT Re-Evaluation 07/30/23    Authorization Type MCR/MCD    PT Start Time 1005    PT Stop Time 1043    PT Time Calculation (min) 38 min    Activity Tolerance Patient tolerated treatment well    Behavior During Therapy WFL for tasks assessed/performed               Past Medical History:  Diagnosis Date   Allergy    RHINITIS   Arthritis    COVID-19    Depression    Family history of cancer of female genital organ    Family history of lung cancer    Family history of throat cancer    Glaucoma    Hyperlipidemia    Hypertension    Osteopenia    TMJ syndrome    Past Surgical History:  Procedure Laterality Date   BIOPSY BREAST     BLEPHAROPLASTY     KNEE ARTHROSCOPY     right knee, 1990 and 2012, Dr. Nat Math JOINT SURGERY     TONSILLECTOMY     TOTAL KNEE ARTHROPLASTY  08/10/2012   Procedure: TOTAL KNEE ARTHROPLASTY;  Surgeon: Valeria Batman, MD;  Location: [facility];  Service: Orthopedics;  Laterality: Right;  Right Total Knee Arthroplasty   TOTAL KNEE ARTHROPLASTY Left 06/14/2019   Procedure: LEFT TOTAL KNEE ARTHROPLASTY;  Surgeon: Valeria Batman, MD;  Location: [facility];  Service: Orthopedics;  Laterality: Left;   Patient Active Problem List   Diagnosis Date Noted   History of cataract surgery, unspecified laterality 09/19/2021   Genetic testing 11/06/2020   Family history of throat cancer    Family history of lung cancer    Family history of cancer of female genital organ    Cotton wool spots 09/29/2018   Epidermoid cyst of skin 03/17/2018   Essential hypertension 02/10/2018   Other seasonal allergic rhinitis 11/15/2015   Family history of breast cancer in mother 09/06/2014   Dysthymia  09/06/2014   Glaucoma 09/06/2014   Hyperlipidemia LDL goal <100 09/06/2014   S/P TKR (total knee replacement) 08/12/2012   Family history of heart disease in female family member before age 47 07/22/2012    PCP: Ronnald Nian, MD  REFERRING PROVIDER: Joselyn Arrow, MD  REFERRING DIAG: Balance problems [R26.89]   THERAPY DIAG:  Unsteadiness on feet  Muscle weakness (generalized)  Rationale for Evaluation and Treatment: Rehabilitation  ONSET DATE: 3-4 years   SUBJECTIVE:   SUBJECTIVE STATEMENT:  Patient reports that she feels she is doing better with her balance since starting PT. She using her knee brace/sleeve for high level activities. She has pain with kneeling. She will be traveling for 3 weeks and would like to resume PT when she returns.   PERTINENT HISTORY: PMHx includes Arthritis, Depression, HTN, Osteopenia  4 year ago - Lt TKR  10 year ago - Rt TKR Are you having pain? No  PRECAUTIONS: None  RED FLAGS: None   WEIGHT BEARING RESTRICTIONS: No  FALLS:  Has patient fallen in last 6 months? Yes. Number of falls frequently near falls "then I stop myself."   LIVING ENVIRONMENT: Lives with: lives with their spouse Lives in: House/apartment Stairs:  Yes: Internal: 12-15 steps; on right going up and External: 4 steps; on right going up Has following equipment at home: None  OCCUPATION: retired   PLOF: Independent  PATIENT GOALS: "To work on my balance"   NEXT MD VISIT: 06/08/23  OBJECTIVE:    PATIENT SURVEYS:  FOTO 53 current, 57 predicted   COGNITION: Overall cognitive status: Within functional limits for tasks assessed     LOWER EXTREMITY MMT: to be assessed at follow up   MMT Right eval Left eval  Hip flexion 4+ 4+  Hip extension    Hip abduction 5 5  Hip adduction 5 5  Hip internal rotation 4+ 4+  Hip external rotation 4+ 4+  Knee flexion 5 5  Knee extension    Ankle dorsiflexion 5 5  Ankle plantarflexion    Ankle inversion    Ankle  eversion     (Blank rows = not tested)   FUNCTIONAL TESTS:  Functional gait assessment: 14 total     Balance Tests Eval   Romberg on even ground  30 sec  Romberg on uneven ground  30 sec  Lt Tandem on even ground 15 sec Mod-max sway  Rt Tandem on even ground  3 sec  Romberg on even ground EC 30 sec   Romberg on uneven ground EC  30 sec  Mod-max sway    GAIT: Distance walked: 150 ft  Assistive device utilized: None Level of assistance: Complete Independence Comments: slow gait with wide BOS    TODAY'S TREATMENT:        [facility] Adult PT Treatment:                                                DATE: 07/25/2023  Neuromuscular re-ed: //bars:  Tandem stance on floor x30" BIL Romberg EO head turns/nod x 1 min on airex  Romberg EC head turns/nods x 1 min on airex  Intermittent UE use to control swaying  Ball pass 1000g x 15 on airex  Ball overhead with head nods, 2 x 10 on airex   Therapeutic Activity:  Step ups 6" x10 fwd/lat BIL, 2# ankle weight  Standing Heel-Toe Raises, 2 x 10, 2# ankle weight  Standing 3-way hip, x 10 each LE  // bars x 4 laps fwd, x 2 lateral  Obstacle course fwd: 3 hurdles, balance beam - occasional fingertip support to maintain balance Updated and review HEP for additional LE strengthening activities   [facility] Adult PT Treatment:                                                DATE: 07/22/23 Neuromuscular re-ed: //bars:  Tandem stance on floor x30" BIL Romberg EO head turns/nod x 1 min on airex  Romberg EC head turns/nods x 1 min on airex  Intermittent UE use to control swaying  Ball pass 1000g x 15 on airex  Ball overhead with head nods, 2 x 10 on airex  SLS with circles on orange 5000g ball x30" CW/CCW BIL - fingertip support  Therapeutic Activity:  Step ups 6" x10 fwd/lat BIL, 2# ankle weight  Standing Heel-Toe Raises, 2 x 10, 2# ankle weight   Standing 3-way hip, x 10 each LE  //  bars x 4 laps fwd, x 2 lateral  Obstacle course fwd: 3  hurdles, balance beam - occasional fingertip support to maintain balance   [facility] Adult PT Treatment:                                                DATE: 07/08/2023 Neuromuscular re-ed: //bars:  Tandem stance on airex beam, 3 x 30 sec each LE Romberg EO head turns/nods x1 min, x 1 min on airex  Romberg EC head turns/nods x1 min, x 1 min on airex  Intermittent UE use to control swaying  Ball pass 1000g x 15 on airex  Ball overhead with head nods, 2 x 10 on airex  Therapeutic Activity:  Standing Heel-Toe Raises, 2 x 10  Standing 3-way hip, 2 x 5 each LE  // bars x 4 laps each  Obstacle course fwd: 3 hurdles, 1 airex, and 6" step  Lateral walking on airex with gap between airex beam and airex pad to encourage motor planning     PATIENT EDUCATION:  Education details: discussion of POC, prognosis and goals for skilled PT; discussion of components of balance and her related deficits    Person educated: Patient Education method: Explanation and Demonstration Education comprehension: verbalized understanding and needs further education  HOME EXERCISE PROGRAM: Access Code: [email or web address] URL: [email or web address] Date: 06/19/2023 Prepared by: Mauri Reading  Exercises - Standing Tandem Balance with Counter Support  - 2 x daily - 7 x weekly - 3 sets - 20-30 sec hold - Heel Toe Raises with Counter Support  - 2 x daily - 7 x weekly - 2 sets - 10 reps - Mini Squat with Counter Support  - 2 x daily - 7 x weekly - 2 sets - 10 reps - Standing Marching  - 2 x daily - 7 x weekly - 2 sets - 10 reps  ASSESSMENT:  CLINICAL IMPRESSION: Brittlynn is responded well to skilled PT intervention, and is demonstrating improved navigation of stairs and obstacles. However, she continues to require CGA or UE support for safe performance of high level activities. Provided patient with updated HEP for additional LE strengthening. She will be traveling for 3 weeks is expected to return for reassessment  about 4 weeks from today's visit.     OBJECTIVE IMPAIRMENTS: Abnormal gait, decreased activity tolerance, decreased balance, decreased endurance, decreased mobility, and decreased safety awareness.   ACTIVITY LIMITATIONS: carrying, lifting, bending, squatting, stairs, and locomotion level  PARTICIPATION LIMITATIONS: cleaning and shopping  PERSONAL FACTORS: Age, Past/current experiences, Time since onset of injury/illness/exacerbation, and 1-2 comorbidities: PMHx includes Arthritis, Depression, HTN, Osteopenia, BIL TKR  are also affecting patient's functional outcome.   REHAB POTENTIAL: Fair    CLINICAL DECISION MAKING: Stable/uncomplicated  EVALUATION COMPLEXITY: Low   GOALS: Goals reviewed with patient? Yes  SHORT TERM GOALS: Target date: 07/02/2023   Patient will be independent with initial home program for balance retraining and LE strengthening as indicated.  Baseline: provided at eval  Goal status: Ongoing Pt reports occasional compliance 07/15/23  2.  Patient will demonstrate improved Rt tandem stance to at least 15 sec.  Baseline: see objective measures  Goal status: MET    LONG TERM GOALS: Target date: 07/30/2023   Patient will report improved overall functional ability with FOTO score of 57 or greater.  Baseline: 53 07/24/23: 56 Goal status: Nearly Met  2.  Patient will report less than 3 near falls over past 7 days.  Baseline: very frequent  07/24/23: None  Goal status: MET  3.  Patient will demonstrate ability to perform at least 10 steps of tandem walking in order to have improved navigation of narrow spaces.  Baseline: 4-5 steps in a row.  Goal status: INITIAL  4.  Patient will demonstrate ability to safely pick up items from 1-10# without LOB.  Baseline: unsteady with bending forward.  Goal status: INITIAL  5.  Patient will demonstrate improved FGA score to at least 23 or greater in order to demonstrate decreased fall risk.  Baseline: 14 Goal status:  INITIAL    PLAN:  PT FREQUENCY: 1-2x/week  PT DURATION: 8 weeks  PLANNED INTERVENTIONS: Therapeutic exercises, Therapeutic activity, Neuromuscular re-education, Balance training, Gait training, Patient/Family education, Self Care, Joint mobilization, Stair training, Manual therapy, and Re-evaluation  PLAN FOR NEXT SESSION: static balance training on even/uneven surfaces, dynamic balance and gait training, balance with EC, balance with head nods/turns,   Mauri Reading, PT, DPT 07/25/2023, 9:27 AM

## 2023-08-17 DIAGNOSIS — Z1231 Encounter for screening mammogram for malignant neoplasm of breast: Secondary | ICD-10-CM | POA: Diagnosis not present

## 2023-08-17 LAB — HM MAMMOGRAPHY

## 2023-08-19 ENCOUNTER — Ambulatory Visit: Payer: Medicare Other | Attending: Family Medicine

## 2023-08-19 DIAGNOSIS — R2681 Unsteadiness on feet: Secondary | ICD-10-CM | POA: Insufficient documentation

## 2023-08-19 DIAGNOSIS — M6281 Muscle weakness (generalized): Secondary | ICD-10-CM | POA: Insufficient documentation

## 2023-08-19 NOTE — Therapy (Signed)
OUTPATIENT PHYSICAL THERAPY NOTE  Progress Note  Reporting Period 06/04/23 to 08/19/23  See note below for Objective Data and Assessment of Progress/Goals.      Patient Name: Jodi Duffy MRN: [id number] DOB:[birth year], 72 y.o., female Today's Date: 08/19/2023  END OF SESSION:  PT End of Session - 08/19/23 1850     Visit Number 8    Number of Visits 12    Date for PT Re-Evaluation 07/30/23    Authorization Type MCR/MCD    PT Start Time 1402    PT Stop Time 1444    PT Time Calculation (min) 42 min    Activity Tolerance Patient tolerated treatment well    Behavior During Therapy WFL for tasks assessed/performed                Past Medical History:  Diagnosis Date   Allergy    RHINITIS   Arthritis    COVID-19    Depression    Family history of cancer of female genital organ    Family history of lung cancer    Family history of throat cancer    Glaucoma    Hyperlipidemia    Hypertension    Osteopenia    TMJ syndrome    Past Surgical History:  Procedure Laterality Date   BIOPSY BREAST     BLEPHAROPLASTY     KNEE ARTHROSCOPY     right knee, 1990 and 2012, Dr. Nat Math JOINT SURGERY     TONSILLECTOMY     TOTAL KNEE ARTHROPLASTY  08/10/2012   Procedure: TOTAL KNEE ARTHROPLASTY;  Surgeon: Valeria Batman, MD;  Location: [facility];  Service: Orthopedics;  Laterality: Right;  Right Total Knee Arthroplasty   TOTAL KNEE ARTHROPLASTY Left 06/14/2019   Procedure: LEFT TOTAL KNEE ARTHROPLASTY;  Surgeon: Valeria Batman, MD;  Location: [facility];  Service: Orthopedics;  Laterality: Left;   Patient Active Problem List   Diagnosis Date Noted   History of cataract surgery, unspecified laterality 09/19/2021   Genetic testing 11/06/2020   Family history of throat cancer    Family history of lung cancer    Family history of cancer of female genital organ    Cotton wool spots 09/29/2018   Epidermoid cyst of skin 03/17/2018   Essential hypertension  02/10/2018   Other seasonal allergic rhinitis 11/15/2015   Family history of breast cancer in mother 09/06/2014   Dysthymia 09/06/2014   Glaucoma 09/06/2014   Hyperlipidemia LDL goal <100 09/06/2014   S/P TKR (total knee replacement) 08/12/2012   Family history of heart disease in female family member before age 82 07/22/2012    PCP: Ronnald Nian, MD  REFERRING PROVIDER: Joselyn Arrow, MD  REFERRING DIAG: Balance problems [R26.89]   THERAPY DIAG:  Unsteadiness on feet  Muscle weakness (generalized)  Rationale for Evaluation and Treatment: Rehabilitation  ONSET DATE: 3-4 years   SUBJECTIVE:   SUBJECTIVE STATEMENT:  Patient reports that "I [place] in the airport because it was uneven and I wasn't paying attention." She was otherwise able to navigate many different surfaces and uneven ground. She is able to kneel if there is a pillow under her knees but has some difficulty getting up. "I can do it, but doesn't feel real good."   She states that she wants to feel more confident in her left leg. "I had to get help from my husband a lot."   PERTINENT HISTORY: PMHx includes Arthritis, Depression, HTN, Osteopenia  4 year  ago - Lt TKR  10 year ago - Rt TKR Are you having pain? No  PRECAUTIONS: None  RED FLAGS: None   WEIGHT BEARING RESTRICTIONS: No  FALLS:  Has patient fallen in last 6 months? Yes. Number of falls frequently near falls "then I stop myself."   LIVING ENVIRONMENT: Lives with: lives with their spouse Lives in: House/apartment Stairs: Yes: Internal: 12-15 steps; on right going up and External: 4 steps; on right going up Has following equipment at home: None  OCCUPATION: retired   PLOF: Independent  PATIENT GOALS: "To work on my balance"   NEXT MD VISIT: 06/08/23  OBJECTIVE:    PATIENT SURVEYS:  FOTO 53 current, 57 predicted   COGNITION: Overall cognitive status: Within functional limits for tasks assessed     LOWER EXTREMITY MMT:    MMT Right eval Left eval Right 08/19/23 Left 08/19/23  Hip flexion 4+ 4+ 5 4+  Hip extension      Hip abduction 5 5    Hip adduction 5 5    Hip internal rotation 4+ 4+ 5 4+  Hip external rotation 4+ 4+ 5 5  Knee flexion 5 5    Knee extension      Ankle dorsiflexion 5 5    Ankle plantarflexion      Ankle inversion      Ankle eversion       (Blank rows = not tested)   FUNCTIONAL TESTS:  Functional gait assessment: 14 total     Balance Tests Eval   Romberg on even ground  30 sec  Romberg on uneven ground  30 sec  Lt Tandem on even ground 15 sec Mod-max sway  Rt Tandem on even ground  3 sec  Romberg on even ground EC 30 sec   Romberg on uneven ground EC  30 sec  Mod-max sway    GAIT: Distance walked: 150 ft  Assistive device utilized: None Level of assistance: Complete Independence Comments: slow gait with wide BOS    TODAY'S TREATMENT:        [facility] Adult PT Treatment:                                                DATE: 08/19/2023   Therapeutic Activity:  Reassessment of objective measures and subjective assessment regarding progress towards established goals and plan for extended POC to further address remaining functional deficits.   [facility] Adult PT Treatment:                                                DATE: 07/25/2023  Neuromuscular re-ed: //bars:  Tandem stance on floor x30" BIL Romberg EO head turns/nod x 1 min on airex  Romberg EC head turns/nods x 1 min on airex  Intermittent UE use to control swaying  Ball pass 1000g x 15 on airex  Ball overhead with head nods, 2 x 10 on airex   Therapeutic Activity:  Step ups 6" x10 fwd/lat BIL, 2# ankle weight  Standing Heel-Toe Raises, 2 x 10, 2# ankle weight  Standing 3-way hip, x 10 each LE  // bars x 4 laps fwd, x 2 lateral  Obstacle course fwd: 3 hurdles, balance beam - occasional fingertip support  to maintain balance Updated and review HEP for additional LE strengthening activities   [facility] Adult PT  Treatment:                                                DATE: 07/22/23 Neuromuscular re-ed: //bars:  Tandem stance on floor x30" BIL Romberg EO head turns/nod x 1 min on airex  Romberg EC head turns/nods x 1 min on airex  Intermittent UE use to control swaying  Ball pass 1000g x 15 on airex  Ball overhead with head nods, 2 x 10 on airex  SLS with circles on orange 5000g ball x30" CW/CCW BIL - fingertip support  Therapeutic Activity:  Step ups 6" x10 fwd/lat BIL, 2# ankle weight  Standing Heel-Toe Raises, 2 x 10, 2# ankle weight   Standing 3-way hip, x 10 each LE  // bars x 4 laps fwd, x 2 lateral  Obstacle course fwd: 3 hurdles, balance beam - occasional fingertip support to maintain balance    PATIENT EDUCATION:  Education details: discussion of POC, prognosis and goals for skilled PT; discussion of components of balance and her related deficits    Person educated: Patient Education method: Explanation and Demonstration Education comprehension: verbalized understanding and needs further education  HOME EXERCISE PROGRAM: Access Code: [email or web address] URL: [email or web address] Date: 06/19/2023 Prepared by: Mauri Reading  Exercises - Standing Tandem Balance with Counter Support  - 2 x daily - 7 x weekly - 3 sets - 20-30 sec hold - Heel Toe Raises with Counter Support  - 2 x daily - 7 x weekly - 2 sets - 10 reps - Mini Squat with Counter Support  - 2 x daily - 7 x weekly - 2 sets - 10 reps - Standing Marching  - 2 x daily - 7 x weekly - 2 sets - 10 reps  ASSESSMENT:  CLINICAL IMPRESSION: Reagyn has made good progress with current plan of care.  She is demonstrating improved lower extremity strength, increased score on functional gait assessment, and was able to navigate uneven terrain on her vacation without falls.  However she continues to be reliant on upper extremity support from her husband or assistive device.  She has most difficulty at this time navigating narrow  spaces/tandem walking, balance and gait activities without visual input, navigating stairs without reliance on upper extremity support.  She will benefit from ongoing skilled physical therapy once a week for 4 weeks to address these remaining functional deficits, and maximization of safe and independent function.    OBJECTIVE IMPAIRMENTS: Abnormal gait, decreased activity tolerance, decreased balance, decreased endurance, decreased mobility, and decreased safety awareness.   ACTIVITY LIMITATIONS: carrying, lifting, bending, squatting, stairs, and locomotion level  PARTICIPATION LIMITATIONS: cleaning and shopping  PERSONAL FACTORS: Age, Past/current experiences, Time since onset of injury/illness/exacerbation, and 1-2 comorbidities: PMHx includes Arthritis, Depression, HTN, Osteopenia, BIL TKR  are also affecting patient's functional outcome.   REHAB POTENTIAL: Fair    CLINICAL DECISION MAKING: Stable/uncomplicated  EVALUATION COMPLEXITY: Low   GOALS: Goals reviewed with patient? Yes  SHORT TERM GOALS: Target date: 07/02/2023   Patient will be independent with initial home program for balance retraining and LE strengthening as indicated.  Baseline: provided at eval  Goal status: MET  2.  Patient will demonstrate improved Rt tandem stance to at least 15 sec.  Baseline: see objective measures  Goal status: MET    LONG TERM GOALS: Target date: 09/16/2023, last updated 08/19/23    Patient will report improved overall functional ability with FOTO score of 57 or greater.  Baseline: 53 07/24/23: 56 08/19/23: 59 Goal status: MET  2.  Patient will report less than 3 near falls over past 7 days.  Baseline: very frequent  07/24/23: None  Goal status: MET  3.  Patient will demonstrate ability to perform at least 10 steps of tandem walking in order to have improved navigation of narrow spaces.  Baseline: 4-5 steps in a row.  08/19/23: 5-6 steps in a row  Goal status: PROGRESSING  4.   Patient will demonstrate ability to safely pick up items from 1-10# without LOB.  Baseline: unsteady with bending forward.  Goal status: MET  5.  Patient will demonstrate improved FGA score to at least 23 or greater in order to demonstrate decreased fall risk.  Baseline: 14 08/19/23: 19 Goal status: PROGRESSING    PLAN:  PT FREQUENCY: 1x/week  PT DURATION: 4 weeks, last updated 08/19/23  PLANNED INTERVENTIONS: Therapeutic exercises, Therapeutic activity, Neuromuscular re-education, Balance training, Gait training, Patient/Family education, Self Care, Joint mobilization, Stair training, Manual therapy, and Re-evaluation  PLAN FOR NEXT SESSION: dynamic balance, reactionary balance, stair navigation with and w/o UE support, obstacle navigation, eyes closed balance and gait activities.   Mauri Reading, PT, DPT 08/19/2023, 6:53 PM

## 2023-08-20 ENCOUNTER — Ambulatory Visit: Payer: Medicare Other

## 2023-08-20 DIAGNOSIS — R2681 Unsteadiness on feet: Secondary | ICD-10-CM | POA: Diagnosis not present

## 2023-08-20 DIAGNOSIS — M6281 Muscle weakness (generalized): Secondary | ICD-10-CM | POA: Diagnosis not present

## 2023-08-20 NOTE — Therapy (Signed)
OUTPATIENT PHYSICAL THERAPY NOTE     Patient Name: Jodi Duffy MRN: [id number] DOB:Oct 02, 1951, 72 y.o., female Today's Date: 08/20/2023  END OF SESSION:  PT End of Session - 08/20/23 0959     Visit Number 9    Number of Visits 12    Date for PT Re-Evaluation 09/16/23    Authorization Type MCR/MCD    PT Start Time 0915    PT Stop Time 0953    PT Time Calculation (min) 38 min    Equipment Utilized During Treatment Gait belt    Activity Tolerance Patient tolerated treatment well    Behavior During Therapy WFL for tasks assessed/performed                 Past Medical History:  Diagnosis Date   Allergy    RHINITIS   Arthritis    COVID-19    Depression    Family history of cancer of female genital organ    Family history of lung cancer    Family history of throat cancer    Glaucoma    Hyperlipidemia    Hypertension    Osteopenia    TMJ syndrome    Past Surgical History:  Procedure Laterality Date   BIOPSY BREAST     BLEPHAROPLASTY     KNEE ARTHROSCOPY     right knee, 1990 and 2012, Dr. Nat Math JOINT SURGERY     TONSILLECTOMY     TOTAL KNEE ARTHROPLASTY  08/10/2012   Procedure: TOTAL KNEE ARTHROPLASTY;  Surgeon: Valeria Batman, MD;  Location: [facility];  Service: Orthopedics;  Laterality: Right;  Right Total Knee Arthroplasty   TOTAL KNEE ARTHROPLASTY Left 06/14/2019   Procedure: LEFT TOTAL KNEE ARTHROPLASTY;  Surgeon: Valeria Batman, MD;  Location: [facility];  Service: Orthopedics;  Laterality: Left;   Patient Active Problem List   Diagnosis Date Noted   History of cataract surgery, unspecified laterality 09/19/2021   Genetic testing 11/06/2020   Family history of throat cancer    Family history of lung cancer    Family history of cancer of female genital organ    Cotton wool spots 09/29/2018   Epidermoid cyst of skin 03/17/2018   Essential hypertension 02/10/2018   Other seasonal allergic rhinitis 11/15/2015   Family history  of breast cancer in mother 09/06/2014   Dysthymia 09/06/2014   Glaucoma 09/06/2014   Hyperlipidemia LDL goal <100 09/06/2014   S/P TKR (total knee replacement) 08/12/2012   Family history of heart disease in female family member before age 21 07/22/2012    PCP: Ronnald Nian, MD  REFERRING PROVIDER: Joselyn Arrow, MD  REFERRING DIAG: Balance problems [R26.89]   THERAPY DIAG:  Unsteadiness on feet  Muscle weakness (generalized)  Rationale for Evaluation and Treatment: Rehabilitation  ONSET DATE: 3-4 years   SUBJECTIVE:   SUBJECTIVE STATEMENT:  No new complaints since progress note yesterday. Denies any new falls.   PERTINENT HISTORY: PMHx includes Arthritis, Depression, HTN, Osteopenia  4 year ago - Lt TKR  10 year ago - Rt TKR Are you having pain? No  PRECAUTIONS: None  RED FLAGS: None   WEIGHT BEARING RESTRICTIONS: No  FALLS:  Has patient fallen in last 6 months? Yes. Number of falls frequently near falls "then I stop myself."   LIVING ENVIRONMENT: Lives with: lives with their spouse Lives in: House/apartment Stairs: Yes: Internal: 12-15 steps; on right going up and External: 4 steps; on right going up Has following equipment at home:  None  OCCUPATION: retired   PLOF: Independent  PATIENT GOALS: "To work on my balance"   NEXT MD VISIT: 06/08/23  OBJECTIVE:    PATIENT SURVEYS:  FOTO 53 current, 57 predicted   COGNITION: Overall cognitive status: Within functional limits for tasks assessed     LOWER EXTREMITY MMT:   MMT Right eval Left eval Right 08/19/23 Left 08/19/23  Hip flexion 4+ 4+ 5 4+  Hip extension      Hip abduction 5 5    Hip adduction 5 5    Hip internal rotation 4+ 4+ 5 4+  Hip external rotation 4+ 4+ 5 5  Knee flexion 5 5    Knee extension      Ankle dorsiflexion 5 5    Ankle plantarflexion      Ankle inversion      Ankle eversion       (Blank rows = not tested)   FUNCTIONAL TESTS:  Functional gait assessment: 14  total     Balance Tests Eval   Romberg on even ground  30 sec  Romberg on uneven ground  30 sec  Lt Tandem on even ground 15 sec Mod-max sway  Rt Tandem on even ground  3 sec  Romberg on even ground EC 30 sec   Romberg on uneven ground EC  30 sec  Mod-max sway    GAIT: Distance walked: 150 ft  Assistive device utilized: None Level of assistance: Complete Independence Comments: slow gait with wide BOS    TODAY'S TREATMENT:         [facility] Adult PT Treatment:                                                DATE: 08/20/2023  Therapeutic Activity:  Standing Heel-Toe Raises, 2 x 10 on airex  Squats with cueing for glute activation for hip extension with return to stand  Standing 3-way hip, x 10 each LE  Lateral Step up to TB foam pad, x 15 each LE SLS cone taps from TB foam pad (5 cones, varied pattern provide via clinician cueing) 2 x 20 sec each side  1 hand on // bar  // bars: 3 laps x Obstacle course 1: FORWARD 3 hurdles, airex balance beam, 1 airex pad- occasional single arm support with // bars to maintain balance 2 laps x Obstacle course 2: LATERAL  Reactionary Balance Activities Semi Tandem on airex, varied long-duration perturbations x 1 min each LE Feet apart on airex, vaired long-duration perturbation x 1 min eyes open  Feet apart on airex, vaired long-duration perturbation 2 x 1 min eyes closed     [facility] Adult PT Treatment:                                                DATE: 08/19/2023   Therapeutic Activity:  Reassessment of objective measures and subjective assessment regarding progress towards established goals and plan for extended POC to further address remaining functional deficits.   [facility] Adult PT Treatment:  DATE: 07/25/2023  Neuromuscular re-ed: //bars:  Tandem stance on floor x30" BIL Romberg EO head turns/nod x 1 min on airex  Romberg EC head turns/nods x 1 min on airex  Intermittent UE use to control  swaying  Ball pass 1000g x 15 on airex  Ball overhead with head nods, 2 x 10 on airex   Therapeutic Activity:  Step ups 6" x10 fwd/lat BIL, 2# ankle weight  Standing Heel-Toe Raises, 2 x 10, 2# ankle weight  Standing 3-way hip, x 10 each LE  // bars x 4 laps fwd, x 2 lateral  Obstacle course fwd: 3 hurdles, balance beam - occasional fingertip support to maintain balance Updated and review HEP for additional LE strengthening activities   [facility] Adult PT Treatment:                                                DATE: 07/22/23 Neuromuscular re-ed: //bars:  Tandem stance on floor x30" BIL Romberg EO head turns/nod x 1 min on airex  Romberg EC head turns/nods x 1 min on airex  Intermittent UE use to control swaying  Ball pass 1000g x 15 on airex  Ball overhead with head nods, 2 x 10 on airex  SLS with circles on orange 5000g ball x30" CW/CCW BIL - fingertip support  Therapeutic Activity:  Step ups 6" x10 fwd/lat BIL, 2# ankle weight  Standing Heel-Toe Raises, 2 x 10, 2# ankle weight   Standing 3-way hip, x 10 each LE  // bars x 4 laps fwd, x 2 lateral  Obstacle course fwd: 3 hurdles, balance beam - occasional fingertip support to maintain balance    PATIENT EDUCATION:  Education details: discussion of POC, prognosis and goals for skilled PT; discussion of components of balance and her related deficits    Person educated: Patient Education method: Explanation and Demonstration Education comprehension: verbalized understanding and needs further education  HOME EXERCISE PROGRAM: Access Code: [email or web address] URL: [email or web address] Date: 06/19/2023 Prepared by: Mauri Reading  Exercises - Standing Tandem Balance with Counter Support  - 2 x daily - 7 x weekly - 3 sets - 20-30 sec hold - Heel Toe Raises with Counter Support  - 2 x daily - 7 x weekly - 2 sets - 10 reps - Mini Squat with Counter Support  - 2 x daily - 7 x weekly - 2 sets - 10 reps - Standing Marching  - 2 x  daily - 7 x weekly - 2 sets - 10 reps  ASSESSMENT:  CLINICAL IMPRESSION: Yesmin was able to tolerate updated balance and gait training program today, including progression of reactionary balance activities. She is most limited with prolonged SLS, narrow stance and sagittal plane corrections. She will continue with previous HEP and verbal addition of lateral step ups at home. We will continue to progress reactionary and dynamic balance activities as tolerated with remaining visits to maximize safe and independent function at time of discharge from PT.     OBJECTIVE IMPAIRMENTS: Abnormal gait, decreased activity tolerance, decreased balance, decreased endurance, decreased mobility, and decreased safety awareness.   ACTIVITY LIMITATIONS: carrying, lifting, bending, squatting, stairs, and locomotion level  PARTICIPATION LIMITATIONS: cleaning and shopping  PERSONAL FACTORS: Age, Past/current experiences, Time since onset of injury/illness/exacerbation, and 1-2 comorbidities: PMHx includes Arthritis, Depression, HTN, Osteopenia, BIL TKR  are also affecting patient's functional outcome.  REHAB POTENTIAL: Fair    CLINICAL DECISION MAKING: Stable/uncomplicated  EVALUATION COMPLEXITY: Low   GOALS: Goals reviewed with patient? Yes  SHORT TERM GOALS: Target date: 07/02/2023   Patient will be independent with initial home program for balance retraining and LE strengthening as indicated.  Baseline: provided at eval  Goal status: MET  2.  Patient will demonstrate improved Rt tandem stance to at least 15 sec.  Baseline: see objective measures  Goal status: MET    LONG TERM GOALS: Target date: 09/16/2023, last updated 08/19/23    Patient will report improved overall functional ability with FOTO score of 57 or greater.  Baseline: 53 07/24/23: 56 08/19/23: 59 Goal status: MET  2.  Patient will report less than 3 near falls over past 7 days.  Baseline: very frequent  07/24/23: None  Goal  status: MET  3.  Patient will demonstrate ability to perform at least 10 steps of tandem walking in order to have improved navigation of narrow spaces.  Baseline: 4-5 steps in a row.  08/19/23: 5-6 steps in a row  Goal status: PROGRESSING  4.  Patient will demonstrate ability to safely pick up items from 1-10# without LOB.  Baseline: unsteady with bending forward.  Goal status: MET  5.  Patient will demonstrate improved FGA score to at least 23 or greater in order to demonstrate decreased fall risk.  Baseline: 14 08/19/23: 19 Goal status: PROGRESSING    PLAN:  PT FREQUENCY: 1x/week  PT DURATION: 4 weeks, last updated 08/19/23  PLANNED INTERVENTIONS: Therapeutic exercises, Therapeutic activity, Neuromuscular re-education, Balance training, Gait training, Patient/Family education, Self Care, Joint mobilization, Stair training, Manual therapy, and Re-evaluation  PLAN FOR NEXT SESSION: dynamic balance, reactionary balance, stair navigation with and w/o UE support, obstacle navigation, eyes closed balance and gait activities.   Mauri Reading, PT, DPT 08/20/2023, 10:01 AM

## 2023-09-02 ENCOUNTER — Ambulatory Visit: Payer: Medicare Other

## 2023-09-02 DIAGNOSIS — R2681 Unsteadiness on feet: Secondary | ICD-10-CM

## 2023-09-02 DIAGNOSIS — M6281 Muscle weakness (generalized): Secondary | ICD-10-CM | POA: Diagnosis not present

## 2023-09-02 NOTE — Therapy (Signed)
OUTPATIENT PHYSICAL THERAPY NOTE     Patient Name: Jodi Duffy MRN: [id number] DOB:[birth year], 72 y.o., female Today's Date: 09/02/2023  END OF SESSION:  PT End of Session - 09/02/23 1220     Visit Number 10    Number of Visits 12    Date for PT Re-Evaluation 09/16/23    Authorization Type MCR/MCD    PT Start Time 1220    PT Stop Time 1258    PT Time Calculation (min) 38 min    Activity Tolerance Patient tolerated treatment well    Behavior During Therapy WFL for tasks assessed/performed                  Past Medical History:  Diagnosis Date   Allergy    RHINITIS   Arthritis    COVID-19    Depression    Family history of cancer of female genital organ    Family history of lung cancer    Family history of throat cancer    Glaucoma    Hyperlipidemia    Hypertension    Osteopenia    TMJ syndrome    Past Surgical History:  Procedure Laterality Date   BIOPSY BREAST     BLEPHAROPLASTY     KNEE ARTHROSCOPY     right knee, 1990 and 2012, Dr. Nat Math JOINT SURGERY     TONSILLECTOMY     TOTAL KNEE ARTHROPLASTY  08/10/2012   Procedure: TOTAL KNEE ARTHROPLASTY;  Surgeon: Valeria Batman, MD;  Location: [facility];  Service: Orthopedics;  Laterality: Right;  Right Total Knee Arthroplasty   TOTAL KNEE ARTHROPLASTY Left 06/14/2019   Procedure: LEFT TOTAL KNEE ARTHROPLASTY;  Surgeon: Valeria Batman, MD;  Location: [facility];  Service: Orthopedics;  Laterality: Left;   Patient Active Problem List   Diagnosis Date Noted   History of cataract surgery, unspecified laterality 09/19/2021   Genetic testing 11/06/2020   Family history of throat cancer    Family history of lung cancer    Family history of cancer of female genital organ    Cotton wool spots 09/29/2018   Epidermoid cyst of skin 03/17/2018   Essential hypertension 02/10/2018   Other seasonal allergic rhinitis 11/15/2015   Family history of breast cancer in mother 09/06/2014    Dysthymia 09/06/2014   Glaucoma 09/06/2014   Hyperlipidemia LDL goal <100 09/06/2014   S/P TKR (total knee replacement) 08/12/2012   Family history of heart disease in female family member before age 29 07/22/2012    PCP: Ronnald Nian, MD  REFERRING PROVIDER: Joselyn Arrow, MD  REFERRING DIAG: Balance problems [R26.89]   THERAPY DIAG:  Unsteadiness on feet  Muscle weakness (generalized)  Rationale for Evaluation and Treatment: Rehabilitation  ONSET DATE: 3-4 years   SUBJECTIVE:   SUBJECTIVE STATEMENT: Patient denies any new falls.   PERTINENT HISTORY: PMHx includes Arthritis, Depression, HTN, Osteopenia  4 year ago - Lt TKR  10 year ago - Rt TKR Are you having pain? No  PRECAUTIONS: None  RED FLAGS: None   WEIGHT BEARING RESTRICTIONS: No  FALLS:  Has patient fallen in last 6 months? Yes. Number of falls frequently near falls "then I stop myself."   LIVING ENVIRONMENT: Lives with: lives with their spouse Lives in: House/apartment Stairs: Yes: Internal: 12-15 steps; on right going up and External: 4 steps; on right going up Has following equipment at home: None  OCCUPATION: retired   PLOF: Independent  PATIENT GOALS: "To work on my  balance"   NEXT MD VISIT: 06/08/23  OBJECTIVE:    PATIENT SURVEYS:  FOTO 53 current, 57 predicted   COGNITION: Overall cognitive status: Within functional limits for tasks assessed     LOWER EXTREMITY MMT:   MMT Right eval Left eval Right 08/19/23 Left 08/19/23  Hip flexion 4+ 4+ 5 4+  Hip extension      Hip abduction 5 5    Hip adduction 5 5    Hip internal rotation 4+ 4+ 5 4+  Hip external rotation 4+ 4+ 5 5  Knee flexion 5 5    Knee extension      Ankle dorsiflexion 5 5    Ankle plantarflexion      Ankle inversion      Ankle eversion       (Blank rows = not tested)   FUNCTIONAL TESTS:  Functional gait assessment: 14 total     Balance Tests Eval   Romberg on even ground  30 sec  Romberg on uneven  ground  30 sec  Lt Tandem on even ground 15 sec Mod-max sway  Rt Tandem on even ground  3 sec  Romberg on even ground EC 30 sec   Romberg on uneven ground EC  30 sec  Mod-max sway    GAIT: Distance walked: 150 ft  Assistive device utilized: None Level of assistance: Complete Independence Comments: slow gait with wide BOS    TODAY'S TREATMENT:       [facility] Adult PT Treatment:                                                DATE: 09/02/23 Therapeutic Activity:  Standing Heel-Toe Raises, 2 x 10 on airex  Squats with cueing for glute activation for hip extension with return to stand  2x10 Standing 3-way hip on Airex, x 10 each LE  Lateral Step up to Airex pad, x 15 each LE SLS cone taps from TB foam pad (5 cones, varied pattern provide via clinician cueing) 2 x 20 sec each side  1 hand on // bar // bars: 3 laps x Obstacle course 1: FORWARD 3 hurdles, airex balance beam, 1 airex pad- occasional single arm support with // bars to maintain balance 3 laps x Obstacle course 2: LATERAL  Reactionary Balance Activities Semi Tandem on airex, varied long-duration perturbations x 1 min each LE Feet apart on airex, vaired long-duration perturbation x 1 min eyes open  Feet apart on airex, vaired long-duration perturbation 2 x 1 min eyes closed    [facility] Adult PT Treatment:                                                DATE: 08/20/2023  Therapeutic Activity:  Standing Heel-Toe Raises, 2 x 10 on airex  Squats with cueing for glute activation for hip extension with return to stand  Standing 3-way hip, x 10 each LE  Lateral Step up to TB foam pad, x 15 each LE SLS cone taps from TB foam pad (5 cones, varied pattern provide via clinician cueing) 2 x 20 sec each side  1 hand on // bar  // bars: 3 laps x Obstacle course 1: FORWARD 3 hurdles, airex balance beam,  1 airex pad- occasional single arm support with // bars to maintain balance 2 laps x Obstacle course 2: LATERAL  Reactionary Balance  Activities Semi Tandem on airex, varied long-duration perturbations x 1 min each LE Feet apart on airex, vaired long-duration perturbation x 1 min eyes open  Feet apart on airex, vaired long-duration perturbation 2 x 1 min eyes closed     [facility] Adult PT Treatment:                                                DATE: 08/19/2023   Therapeutic Activity:  Reassessment of objective measures and subjective assessment regarding progress towards established goals and plan for extended POC to further address remaining functional deficits.     PATIENT EDUCATION:  Education details: discussion of POC, prognosis and goals for skilled PT; discussion of components of balance and her related deficits    Person educated: Patient Education method: Explanation and Demonstration Education comprehension: verbalized understanding and needs further education  HOME EXERCISE PROGRAM: Access Code: [email or web address] URL: [email or web address] Date: 06/19/2023 Prepared by: Mauri Reading  Exercises - Standing Tandem Balance with Counter Support  - 2 x daily - 7 x weekly - 3 sets - 20-30 sec hold - Heel Toe Raises with Counter Support  - 2 x daily - 7 x weekly - 2 sets - 10 reps - Mini Squat with Counter Support  - 2 x daily - 7 x weekly - 2 sets - 10 reps - Standing Marching  - 2 x daily - 7 x weekly - 2 sets - 10 reps  ASSESSMENT:  CLINICAL IMPRESSION: Patient presents to PT reporting no new falls since last session. Session today continued to focus on balance tasks and LE strengthening as well as reactive balance tasks to challenge proprioception. Patient was able to tolerate all prescribed exercises with no adverse effects. Patient continues to benefit from skilled PT services and should be progressed as able to improve functional independence.    OBJECTIVE IMPAIRMENTS: Abnormal gait, decreased activity tolerance, decreased balance, decreased endurance, decreased mobility, and decreased safety  awareness.   ACTIVITY LIMITATIONS: carrying, lifting, bending, squatting, stairs, and locomotion level  PARTICIPATION LIMITATIONS: cleaning and shopping  PERSONAL FACTORS: Age, Past/current experiences, Time since onset of injury/illness/exacerbation, and 1-2 comorbidities: PMHx includes Arthritis, Depression, HTN, Osteopenia, BIL TKR  are also affecting patient's functional outcome.   REHAB POTENTIAL: Fair    CLINICAL DECISION MAKING: Stable/uncomplicated  EVALUATION COMPLEXITY: Low   GOALS: Goals reviewed with patient? Yes  SHORT TERM GOALS: Target date: 07/02/2023   Patient will be independent with initial home program for balance retraining and LE strengthening as indicated.  Baseline: provided at eval  Goal status: MET  2.  Patient will demonstrate improved Rt tandem stance to at least 15 sec.  Baseline: see objective measures  Goal status: MET    LONG TERM GOALS: Target date: 09/16/2023, last updated 08/19/23    Patient will report improved overall functional ability with FOTO score of 57 or greater.  Baseline: 53 07/24/23: 56 08/19/23: 59 Goal status: MET  2.  Patient will report less than 3 near falls over past 7 days.  Baseline: very frequent  07/24/23: None  Goal status: MET  3.  Patient will demonstrate ability to perform at least 10 steps of tandem walking in order to have improved navigation of narrow  spaces.  Baseline: 4-5 steps in a row.  08/19/23: 5-6 steps in a row  Goal status: PROGRESSING  4.  Patient will demonstrate ability to safely pick up items from 1-10# without LOB.  Baseline: unsteady with bending forward.  Goal status: MET  5.  Patient will demonstrate improved FGA score to at least 23 or greater in order to demonstrate decreased fall risk.  Baseline: 14 08/19/23: 19 Goal status: PROGRESSING    PLAN:  PT FREQUENCY: 1x/week  PT DURATION: 4 weeks, last updated 08/19/23  PLANNED INTERVENTIONS: Therapeutic exercises, Therapeutic  activity, Neuromuscular re-education, Balance training, Gait training, Patient/Family education, Self Care, Joint mobilization, Stair training, Manual therapy, and Re-evaluation  PLAN FOR NEXT SESSION: dynamic balance, reactionary balance, stair navigation with and w/o UE support, obstacle navigation, eyes closed balance and gait activities.   Berta Minor, PTA 09/02/2023, 12:57 PM

## 2023-09-09 ENCOUNTER — Ambulatory Visit: Payer: Medicare Other

## 2023-09-09 DIAGNOSIS — M6281 Muscle weakness (generalized): Secondary | ICD-10-CM

## 2023-09-09 DIAGNOSIS — R2681 Unsteadiness on feet: Secondary | ICD-10-CM | POA: Diagnosis not present

## 2023-09-09 NOTE — Therapy (Unsigned)
OUTPATIENT PHYSICAL THERAPY NOTE     Patient Name: Jodi Duffy MRN: [id number] DOB:10/07/1951, 72 y.o., female Today's Date: 09/09/2023  END OF SESSION:         Past Medical History:  Diagnosis Date   Allergy    RHINITIS   Arthritis    COVID-19    Depression    Family history of cancer of female genital organ    Family history of lung cancer    Family history of throat cancer    Glaucoma    Hyperlipidemia    Hypertension    Osteopenia    TMJ syndrome    Past Surgical History:  Procedure Laterality Date   BIOPSY BREAST     BLEPHAROPLASTY     KNEE ARTHROSCOPY     right knee, 1990 and 2012, Dr. Nat Math JOINT SURGERY     TONSILLECTOMY     TOTAL KNEE ARTHROPLASTY  08/10/2012   Procedure: TOTAL KNEE ARTHROPLASTY;  Surgeon: Valeria Batman, MD;  Location: [facility];  Service: Orthopedics;  Laterality: Right;  Right Total Knee Arthroplasty   TOTAL KNEE ARTHROPLASTY Left 06/14/2019   Procedure: LEFT TOTAL KNEE ARTHROPLASTY;  Surgeon: Valeria Batman, MD;  Location: [facility];  Service: Orthopedics;  Laterality: Left;   Patient Active Problem List   Diagnosis Date Noted   History of cataract surgery, unspecified laterality 09/19/2021   Genetic testing 11/06/2020   Family history of throat cancer    Family history of lung cancer    Family history of cancer of female genital organ    Cotton wool spots 09/29/2018   Epidermoid cyst of skin 03/17/2018   Essential hypertension 02/10/2018   Other seasonal allergic rhinitis 11/15/2015   Family history of breast cancer in mother 09/06/2014   Dysthymia 09/06/2014   Glaucoma 09/06/2014   Hyperlipidemia LDL goal <100 09/06/2014   S/P TKR (total knee replacement) 08/12/2012   Family history of heart disease in female family member before age 61 07/22/2012    PCP: Ronnald Nian, MD  REFERRING PROVIDER: Joselyn Arrow, MD  REFERRING DIAG: Balance problems [R26.89]   THERAPY DIAG:  No diagnosis  found.  Rationale for Evaluation and Treatment: Rehabilitation  ONSET DATE: 3-4 years   SUBJECTIVE:   SUBJECTIVE STATEMENT: Patient denies any new falls.   PERTINENT HISTORY: PMHx includes Arthritis, Depression, HTN, Osteopenia  4 year ago - Lt TKR  10 year ago - Rt TKR Are you having pain? No  PRECAUTIONS: None  RED FLAGS: None   WEIGHT BEARING RESTRICTIONS: No  FALLS:  Has patient fallen in last 6 months? Yes. Number of falls frequently near falls "then I stop myself."   LIVING ENVIRONMENT: Lives with: lives with their spouse Lives in: House/apartment Stairs: Yes: Internal: 12-15 steps; on right going up and External: 4 steps; on right going up Has following equipment at home: None  OCCUPATION: retired   PLOF: Independent  PATIENT GOALS: "To work on my balance"   NEXT MD VISIT: 06/08/23  OBJECTIVE:    PATIENT SURVEYS:  FOTO 53 current, 57 predicted   COGNITION: Overall cognitive status: Within functional limits for tasks assessed     LOWER EXTREMITY MMT:   MMT Right eval Left eval Right 08/19/23 Left 08/19/23  Hip flexion 4+ 4+ 5 4+  Hip extension      Hip abduction 5 5    Hip adduction 5 5    Hip internal rotation 4+ 4+ 5 4+  Hip external rotation 4+  4+ 5 5  Knee flexion 5 5    Knee extension      Ankle dorsiflexion 5 5    Ankle plantarflexion      Ankle inversion      Ankle eversion       (Blank rows = not tested)   FUNCTIONAL TESTS:  Functional gait assessment: 14 total     Balance Tests Eval   Romberg on even ground  30 sec  Romberg on uneven ground  30 sec  Lt Tandem on even ground 15 sec Mod-max sway  Rt Tandem on even ground  3 sec  Romberg on even ground EC 30 sec   Romberg on uneven ground EC  30 sec  Mod-max sway    GAIT: Distance walked: 150 ft  Assistive device utilized: None Level of assistance: Complete Independence Comments: slow gait with wide BOS    TODAY'S TREATMENT:         [facility] Adult PT Treatment:                                                 DATE: 09/09/2023  Therapeutic Activity:  Standing Heel-Toe Raises, 2 x 10 on airex  Squats with cueing for glute activation for hip extension with return to stand  2x15 on airex  Standing 3-way hip on ground, x 10 each LE 2# ankle weight Lateral Step up to Airex pad, x 15 each LE cueing for decreased UE support SLS cone taps from ground (3 cones) 3 x 30sec, alternating LE with each round  1 hand on // bar // bars: 2 laps x Obstacle course 1: FORWARD 5 hurdles with // bars to maintain balance, 2# ankle weights  2 laps x Obstacle course 2: LATERAL 5 hurdles with // bars to maintain balance, 2# ankle weights    [facility] Adult PT Treatment:                                                DATE: 09/02/23 Therapeutic Activity:  Standing Heel-Toe Raises, 2 x 10 on airex  Squats with cueing for glute activation for hip extension with return to stand  2x10 Standing 3-way hip on Airex, x 10 each LE  Lateral Step up to Airex pad, x 15 each LE SLS cone taps from TB foam pad (5 cones, varied pattern provide via clinician cueing) 2 x 20 sec each side  1 hand on // bar // bars: 3 laps x Obstacle course 1: FORWARD 3 hurdles, airex balance beam, 1 airex pad- occasional single arm support with // bars to maintain balance 3 laps x Obstacle course 2: LATERAL  Reactionary Balance Activities Semi Tandem on airex, varied long-duration perturbations x 1 min each LE Feet apart on airex, vaired long-duration perturbation x 1 min eyes open  Feet apart on airex, vaired long-duration perturbation 2 x 1 min eyes closed    [facility] Adult PT Treatment:                                                DATE: 08/20/2023  Therapeutic Activity:  Standing Heel-Toe Raises, 2 x 10 on airex  Squats with cueing for glute activation for hip extension with return to stand  Standing 3-way hip, x 10 each LE  Lateral Step up to TB foam pad, x 15 each LE SLS cone taps from TB foam pad (5 cones,  varied pattern provide via clinician cueing) 2 x 20 sec each side  1 hand on // bar  // bars: 3 laps x Obstacle course 1: FORWARD 3 hurdles, airex balance beam, 1 airex pad- occasional single arm support with // bars to maintain balance 2 laps x Obstacle course 2: LATERAL  Reactionary Balance Activities Semi Tandem on airex, varied long-duration perturbations x 1 min each LE Feet apart on airex, vaired long-duration perturbation x 1 min eyes open  Feet apart on airex, vaired long-duration perturbation 2 x 1 min eyes closed     [facility] Adult PT Treatment:                                                DATE: 08/19/2023   Therapeutic Activity:  Reassessment of objective measures and subjective assessment regarding progress towards established goals and plan for extended POC to further address remaining functional deficits.     PATIENT EDUCATION:  Education details: discussion of POC, prognosis and goals for skilled PT; discussion of components of balance and her related deficits    Person educated: Patient Education method: Explanation and Demonstration Education comprehension: verbalized understanding and needs further education  HOME EXERCISE PROGRAM: Access Code: [email or web address] URL: [email or web address] Date: 06/19/2023 Prepared by: Mauri Reading  Exercises - Standing Tandem Balance with Counter Support  - 2 x daily - 7 x weekly - 3 sets - 20-30 sec hold - Heel Toe Raises with Counter Support  - 2 x daily - 7 x weekly - 2 sets - 10 reps - Mini Squat with Counter Support  - 2 x daily - 7 x weekly - 2 sets - 10 reps - Standing Marching  - 2 x daily - 7 x weekly - 2 sets - 10 reps  ASSESSMENT:  CLINICAL IMPRESSION: ***  Patient presents to PT reporting no new falls since last session. Session today continued to focus on balance tasks and LE strengthening as well as reactive balance tasks to challenge proprioception. Patient was able to tolerate all prescribed exercises  with no adverse effects. Patient continues to benefit from skilled PT services and should be progressed as able to improve functional independence.    OBJECTIVE IMPAIRMENTS: Abnormal gait, decreased activity tolerance, decreased balance, decreased endurance, decreased mobility, and decreased safety awareness.   ACTIVITY LIMITATIONS: carrying, lifting, bending, squatting, stairs, and locomotion level  PARTICIPATION LIMITATIONS: cleaning and shopping  PERSONAL FACTORS: Age, Past/current experiences, Time since onset of injury/illness/exacerbation, and 1-2 comorbidities: PMHx includes Arthritis, Depression, HTN, Osteopenia, BIL TKR  are also affecting patient's functional outcome.   REHAB POTENTIAL: Fair    CLINICAL DECISION MAKING: Stable/uncomplicated  EVALUATION COMPLEXITY: Low   GOALS: Goals reviewed with patient? Yes  SHORT TERM GOALS: Target date: 07/02/2023   Patient will be independent with initial home program for balance retraining and LE strengthening as indicated.  Baseline: provided at eval  Goal status: MET  2.  Patient will demonstrate improved Rt tandem stance to at least 15 sec.  Baseline: see objective measures  Goal status: MET  LONG TERM GOALS: Target date: 09/16/2023, last updated 08/19/23    Patient will report improved overall functional ability with FOTO score of 57 or greater.  Baseline: 53 07/24/23: 56 08/19/23: 59 Goal status: MET  2.  Patient will report less than 3 near falls over past 7 days.  Baseline: very frequent  07/24/23: None  Goal status: MET  3.  Patient will demonstrate ability to perform at least 10 steps of tandem walking in order to have improved navigation of narrow spaces.  Baseline: 4-5 steps in a row.  08/19/23: 5-6 steps in a row  09/09/23: 9-10 steps in a row  Goal status: MET  4.  Patient will demonstrate ability to safely pick up items from 1-10# without LOB.  Baseline: unsteady with bending forward.  Goal status:  MET  5.  Patient will demonstrate improved FGA score to at least 23 or greater in order to demonstrate decreased fall risk.  Baseline: 14 08/19/23: 19 09/09/23: 23 Goal status: MET    PLAN:  PT FREQUENCY: 1x/week  PT DURATION: 4 weeks, last updated 08/19/23  PLANNED INTERVENTIONS: Therapeutic exercises, Therapeutic activity, Neuromuscular re-education, Balance training, Gait training, Patient/Family education, Self Care, Joint mobilization, Stair training, Manual therapy, and Re-evaluation  PLAN FOR NEXT SESSION: dynamic balance, reactionary balance, stair navigation with and w/o UE support, obstacle navigation, eyes closed balance and gait activities.   Mauri Reading, PT 09/09/2023, 2:50 PM

## 2023-09-16 ENCOUNTER — Ambulatory Visit: Payer: Medicare Other

## 2023-09-22 DIAGNOSIS — M25522 Pain in left elbow: Secondary | ICD-10-CM | POA: Diagnosis not present

## 2023-09-22 DIAGNOSIS — M654 Radial styloid tenosynovitis [de Quervain]: Secondary | ICD-10-CM | POA: Diagnosis not present

## 2023-09-22 DIAGNOSIS — M19032 Primary osteoarthritis, left wrist: Secondary | ICD-10-CM | POA: Diagnosis not present

## 2023-10-19 ENCOUNTER — Ambulatory Visit (INDEPENDENT_AMBULATORY_CARE_PROVIDER_SITE_OTHER): Payer: Medicare Other | Admitting: Family Medicine

## 2023-10-19 ENCOUNTER — Encounter: Payer: Self-pay | Admitting: Family Medicine

## 2023-10-19 VITALS — BP 136/82 | HR 54 | Temp 97.9°F | Ht 63.0 in | Wt 154.0 lb

## 2023-10-19 DIAGNOSIS — R8290 Unspecified abnormal findings in urine: Secondary | ICD-10-CM

## 2023-10-19 DIAGNOSIS — Z713 Dietary counseling and surveillance: Secondary | ICD-10-CM

## 2023-10-19 LAB — POCT URINALYSIS DIP (PROADVANTAGE DEVICE)
Bilirubin, UA: NEGATIVE
Blood, UA: NEGATIVE
Glucose, UA: NEGATIVE mg/dL
Ketones, POC UA: NEGATIVE mg/dL
Nitrite, UA: NEGATIVE — AB
Protein Ur, POC: NEGATIVE mg/dL
Specific Gravity, Urine: 1.01
Urobilinogen, Ur: 0.2
pH, UA: 6.5 (ref 5.0–8.0)

## 2023-10-19 NOTE — Progress Notes (Unsigned)
   Subjective:    Patient ID: Jodi Duffy, female    DOB: [birth year], 72 y.o.   MRN: [id number]  HPI She is here for consult concerning weight loss.  She is interested in either Zepbound or [place].  She realizes that she does not have any condition that would allow her to get it through her insurance.  She is also now retired and is trying to figure out how to spend her time because of this.  Further history indicates that she is drinking 3 glasses of wine per night and recognizes this as a issue that needs to be addressed especially in regard to calories. She also notes an abnormal smell to her urine.  Review of Systems     Objective:    Physical Exam Alert and in no distress otherwise not examined Urinalysis was negative      Assessment & Plan:  Weight loss counseling, encounter for I discussed the GLP-1 Class of drug and the cost of it being in the $1000 per month range.  Suggested that she might want look into a [place] pharmacy where she might be able to get it cheaper.  I then discussed medical weight loss and wellness as an option and also discussed trying to cut back on her alcohol consumption from 3 glasses down to 2 and possibly to 1 to help reduce the number of calories that she is getting as well as becoming more physically active.  She does get activity by going to the gym ; she does have a trainer.  She is going to think about this and get back to me to decide whether she wants me to write for the GLP-1.

## 2023-11-19 ENCOUNTER — Telehealth: Payer: Self-pay | Admitting: Family Medicine

## 2023-11-19 DIAGNOSIS — R051 Acute cough: Secondary | ICD-10-CM

## 2023-11-19 MED ORDER — HYDROCOD POLI-CHLORPHE POLI ER 10-8 MG/5ML PO SUER: 5.0000 mL | Freq: Two times a day (BID) | ORAL | 0 refills | Status: DC | PRN

## 2023-11-19 NOTE — Telephone Encounter (Signed)
Pt asks if you can please give her a call.

## 2023-11-19 NOTE — Telephone Encounter (Signed)
 She has had difficulty with a cough for the last week and would like some medication called in.  Tussionex has worked well in the past and I will therefore call this in.

## 2023-11-24 DIAGNOSIS — H30033 Focal chorioretinal inflammation, peripheral, bilateral: Secondary | ICD-10-CM | POA: Diagnosis not present

## 2023-11-24 DIAGNOSIS — H43821 Vitreomacular adhesion, right eye: Secondary | ICD-10-CM | POA: Diagnosis not present

## 2023-11-24 DIAGNOSIS — H44113 Panuveitis, bilateral: Secondary | ICD-10-CM | POA: Diagnosis not present

## 2023-11-24 DIAGNOSIS — Z79899 Other long term (current) drug therapy: Secondary | ICD-10-CM | POA: Diagnosis not present

## 2023-11-24 DIAGNOSIS — Z961 Presence of intraocular lens: Secondary | ICD-10-CM | POA: Diagnosis not present

## 2023-12-01 ENCOUNTER — Encounter: Payer: Self-pay | Admitting: Family Medicine

## 2023-12-01 ENCOUNTER — Ambulatory Visit: Payer: Medicare Other

## 2023-12-01 DIAGNOSIS — Z0000 Encounter for general adult medical examination without abnormal findings: Secondary | ICD-10-CM

## 2023-12-01 NOTE — Patient Instructions (Signed)
 Jodi Duffy , Thank you for taking time to come for your Medicare Wellness Visit. I appreciate your ongoing commitment to your health goals. Please review the following plan we discussed and let me know if I can assist you in the future.   Referrals/Orders/Follow-Ups/Clinician Recommendations: none  This is a list of the screening recommended for you and due dates:  Health Maintenance  Topic Date Due   COVID-19 Vaccine (7 - 2024-25 season) 08/13/2023   Mammogram  08/15/2023   Cologuard (Stool DNA test)  09/25/2024   Medicare Annual Wellness Visit  11/30/2024   DTaP/Tdap/Td vaccine (4 - Td or Tdap) 06/21/2028   Flu Shot  Completed   DEXA scan (bone density measurement)  Completed   Hepatitis C Screening  Completed   Zoster (Shingles) Vaccine  Completed   HPV Vaccine  Aged Out   Pneumonia Vaccine  Discontinued   Colon Cancer Screening  Discontinued    Advanced directives: (In Chart) A copy of your advanced directives are scanned into your chart should your provider ever need it.  Next Medicare Annual Wellness Visit scheduled for next year: Yes  insert Preventive Care attachment Insert FALL PREVENTION attachment if needed

## 2023-12-01 NOTE — Progress Notes (Signed)
 Subjective:   Jodi Duffy is a 73 y.o. female who presents for Medicare Annual (Subsequent) preventive examination.  Visit Complete: Virtual I connected with  Jodi Duffy on 12/01/23 by a audio enabled telemedicine application and verified that I am speaking with the correct person using two identifiers.  Interactive audio and video telecommunications were attempted between this provider and patient, however failed, due to patient having technical difficulties OR patient did not have access to video capability.  We continued and completed visit with audio only.  Patient Location: Home  Provider Location: Office/Clinic  I discussed the limitations of evaluation and management by telemedicine. The patient expressed understanding and agreed to proceed.  Vital Signs: Because this visit was a virtual/telehealth visit, some criteria may be missing or patient reported. Any vitals not documented were not able to be obtained and vitals that have been documented are patient reported.  Patient Medicare AWV questionnaire was completed by the patient on 11/24/2023; I have confirmed that all information answered by patient is correct and no changes since this date.  Cardiac Risk Factors include: advanced age (>22men, >18 women);dyslipidemia;hypertension     Objective:    Today's Vitals   There is no height or weight on file to calculate BMI.     12/01/2023   11:31 AM 06/04/2023    3:00 PM 11/25/2022   10:32 AM 11/22/2021    8:15 AM 08/21/2020   11:41 AM 08/02/2019   10:07 AM 06/14/2019   11:30 AM  Advanced Directives  Does Patient Have a Medical Advance Directive? Yes Yes Yes Yes Yes Yes Yes  Type of Estate Agent of [place];Living will Living will;Healthcare Power of [employer] Power of [place];Living will Healthcare Power of [place];Living will Healthcare Power of [place] of [place];Living will Healthcare Power of [place];Living will   Does patient want to make changes to medical advance directive?     No - Patient declined No - Patient declined No - Patient declined  Copy of Healthcare Power of Attorney in Chart? Yes - validated most recent copy scanned in chart (See row information)  Yes - validated most recent copy scanned in chart (See row information) Yes - validated most recent copy scanned in chart (See row information) Yes - validated most recent copy scanned in chart (See row information)  No - copy requested    Current Medications (verified) Outpatient Encounter Medications as of 12/01/2023  Medication Sig   atorvastatin  (LIPITOR) 20 MG tablet Take 1 tablet (20 mg total) by mouth daily.   cetirizine (ZYRTEC) 10 MG chewable tablet Chew 10 mg by mouth daily as needed.   chlorpheniramine-HYDROcodone (TUSSIONEX) 10-8 MG/5ML Take 5 mLs by mouth every 12 (twelve) hours as needed for cough.   cholecalciferol  (VITAMIN D ) 1000 units tablet Take 1,000 Units by mouth daily.   cyanocobalamin 1000 MCG tablet Take by mouth.   Multiple Vitamins-Minerals (MULTIVITAMIN WITH MINERALS) tablet Take 1 tablet by mouth daily. Women's Multivitamin 50+   olmesartan -hydrochlorothiazide  (BENICAR  HCT) 40-12.5 MG tablet Take 1 tablet by mouth daily.   Oxymetazoline HCl (UPNEEQ OP) Apply 1 each to eye daily.   venlafaxine  XR (EFFEXOR -XR) 150 MG 24 hr capsule Take 1 capsule (150 mg total) by mouth daily with breakfast.   mycophenolate (CELLCEPT) 500 MG tablet  (Patient not taking: Reported on 12/01/2023)   naproxen sodium (ALEVE) 220 MG tablet Take 440 mg by mouth every 8 (eight) hours. (Patient not taking: Reported on 12/01/2023)   No facility-administered  encounter medications on file as of 12/01/2023.    Allergies (verified) Patient has no known allergies.   History: Past Medical History:  Diagnosis Date   Allergy    RHINITIS   Arthritis    COVID-19    Depression    Family history of cancer of female genital organ    Family history  of lung cancer    Family history of throat cancer    Glaucoma    Hyperlipidemia    Hypertension    Osteopenia    TMJ syndrome    Past Surgical History:  Procedure Laterality Date   BIOPSY BREAST     BLEPHAROPLASTY     KNEE ARTHROSCOPY     right knee, 1990 and 2012, Dr. Anderson HANCOCK JOINT SURGERY     TONSILLECTOMY     TOTAL KNEE ARTHROPLASTY  08/10/2012   Procedure: TOTAL KNEE ARTHROPLASTY;  Surgeon: Maude LELON Anderson, MD;  Location[facility];  Service: Orthopedics;  Laterality: Right;  Right Total Knee Arthroplasty   TOTAL KNEE ARTHROPLASTY Left 06/14/2019   Procedure: LEFT TOTAL KNEE ARTHROPLASTY;  Surgeon: Anderson Maude LELON, MD;  Location: [facility] ORS;  Service: Orthopedics;  Laterality: Left;   Family History  Problem Relation Age of Onset   Heart disease Mother        valve replacement   Breast cancer Mother 59   Throat cancer Mother 85       smoker   Heart disease Father        died of brain embolism after hip surgery   Arthritis Father    Arthritis Brother    Breast cancer Maternal Aunt        dx in her 46s   Lung cancer Maternal Aunt        dx in her 50s/60s, non-smoker   Vaginal cancer Maternal Aunt        dx in her 91s   Breast cancer Other        dx in her 25s, maternal first cousin's daughter   Diabetes Neg Hx    Hypertension Neg Hx    Stroke Neg Hx    Colon cancer Neg Hx    Social History   Socioeconomic History   Marital status: Married    Spouse name: Not on file   Number of children: Not on file   Years of education: Not on file   Highest education level: Professional school degree (e.g., MD, DDS, DVM, JD)  Occupational History   Occupation: attorney    Employer: [employer]  Tobacco Use   Smoking status: Former    Current packs/day: 0.00    Average packs/day: 1 pack/day for 12.0 years (12.0 ttl pk-yrs)    Types: Cigarettes    Start date: 02/10/1968    Quit date: 02/10/1980    Years since quitting: 43.8   Smokeless  tobacco: Never  Vaping Use   Vaping status: Never Used  Substance and Sexual Activity   Alcohol use: Yes    Alcohol/week: 2.0 standard drinks of alcohol    Types: 2 Glasses of wine per week    Comment: daily   Drug use: No   Sexual activity: Yes  Other Topics Concern   Not on file  Social History Narrative   Exercises with pilates, golf, and has a trainer   Social Drivers of Health   Financial Resource Strain: Low Risk  (12/01/2023)   Overall Financial Resource Strain (CARDIA)    Difficulty of Paying  Living Expenses: Not hard at all  Food Insecurity: No Food Insecurity (12/01/2023)   Hunger Vital Sign    Worried About Running Out of Food in the Last Year: Never true    Ran Out of Food in the Last Year: Never true  Transportation Needs: No Transportation Needs (12/01/2023)   PRAPARE - Administrator, Civil Service (Medical): No    Lack of Transportation (Non-Medical): No  Physical Activity: Insufficiently Active (12/01/2023)   Exercise Vital Sign    Days of Exercise per Week: 2 days    Minutes of Exercise per Session: 30 min  Stress: No Stress Concern Present (12/01/2023)   [employer] of Occupational Health - Occupational Stress Questionnaire    Feeling of Stress : Not at all  Social Connections: Socially Integrated (12/01/2023)   Social Connection and Isolation Panel [NHANES]    Frequency of Communication with Friends and Family: Three times a week    Frequency of Social Gatherings with Friends and Family: Twice a week    Attends Religious Services: More than 4 times per year    Active Member of [employer] or Organizations: Yes    Attends Engineer, Structural: More than 4 times per year    Marital Status: Married    Tobacco Counseling Counseling given: Not Answered   Clinical Intake:  Pre-visit preparation completed: Yes  Pain : No/denies pain     Nutritional Risks: None Diabetes: No  How often do you need to have someone help you when you  read instructions, pamphlets, or other written materials from your doctor or pharmacy?: 1 - Never  Interpreter Needed?: No  Information entered by :: NAllen LPN   Activities of Daily Living    11/24/2023   12:01 PM  In your present state of health, do you have any difficulty performing the following activities:  Hearing? 0  Vision? 0  Difficulty concentrating or making decisions? 0  Walking or climbing stairs? 0  Dressing or bathing? 0  Doing errands, shopping? 0  Preparing Food and eating ? N  Using the Toilet? N  In the past six months, have you accidently leaked urine? N  Do you have problems with loss of bowel control? N  Managing your Medications? N  Managing your Finances? N  Housekeeping or managing your Housekeeping? N    Patient Care Team: Joyce Norleen BROCKS, MD as PCP - General (Family Medicine)  Indicate any recent Medical Services you may have received from other than [facility] providers in the past year (date may be approximate).     Assessment:   This is a routine wellness examination for Destynee.  Hearing/Vision screen Hearing Screening - Comments:: Denies hearing issues Vision Screening - Comments:: Regular eye exams, [facility]   Goals Addressed             This Visit's Progress    Patient Stated       12/01/2023, denies goals       Depression Screen    12/01/2023   11:32 AM 06/08/2023    8:59 AM 11/25/2022   10:33 AM 03/27/2022   11:14 AM 02/27/2022    3:23 PM 11/22/2021    8:16 AM 08/21/2020   11:43 AM  PHQ 2/9 Scores  PHQ - 2 Score 0 0 0 0 0 0 0  PHQ- 9 Score  0 2 0 3      Fall Risk    11/24/2023   12:01 PM 06/08/2023  8:58 AM 11/25/2022   10:33 AM 11/24/2022   12:01 PM 11/22/2021    8:16 AM  Fall Risk   Falls in the past year? 0 1 0 0 0  Number falls in past yr: 0 1 0 0   Injury with Fall? 0 0 0 0   Risk for fall due to : Medication side effect History of fall(s) Medication side effect  Medication side effect  Follow up Falls prevention  discussed;Falls evaluation completed Falls prevention discussed;Falls evaluation completed Falls prevention discussed;Education provided;Falls evaluation completed  Falls evaluation completed;Education provided;Falls prevention discussed    MEDICARE RISK AT HOME: Medicare Risk at Home Any stairs in or around the home?: (Patient-Rptd) Yes If so, are there any without handrails?: (Patient-Rptd) Yes Home free of loose throw rugs in walkways, pet beds, electrical cords, etc?: (Patient-Rptd) Yes Adequate lighting in your home to reduce risk of falls?: (Patient-Rptd) Yes Life alert?: (Patient-Rptd) No Use of a cane, walker or w/c?: (Patient-Rptd) No Grab bars in the bathroom?: (Patient-Rptd) No Shower chair or bench in shower?: (Patient-Rptd) No Elevated toilet seat or a handicapped toilet?: (Patient-Rptd) No  TIMED UP AND GO:  Was the test performed?  No    Cognitive Function:        12/01/2023   11:32 AM 11/25/2022   10:34 AM 11/22/2021    8:17 AM  6CIT Screen  What Year? 0 points 0 points 0 points  What month? 0 points 0 points 0 points  What time? 0 points 0 points 0 points  Count back from 20 0 points 0 points 0 points  Months in reverse 0 points 0 points 0 points  Repeat phrase 0 points 2 points 0 points  Total Score 0 points 2 points 0 points    Immunizations Immunization History  Administered Date(s) Administered   DTaP 08/02/1998   Fluad Quad(high Dose 65+) 08/02/2019, 08/21/2020, 08/21/2022   Influenza Split 08/11/2012   Influenza Whole 09/04/2008, 08/27/2010   Influenza, High Dose Seasonal PF 09/06/2014, 08/07/2018, 08/23/2021   Influenza,inj,Quad PF,6+ Mos 08/08/2013   Influenza-Unspecified 08/17/2014, 08/18/2015, 08/17/2016, 08/17/2017, 08/18/2019   PFIZER(Purple Top)SARS-COV-2 Vaccination 12/07/2019, 12/25/2019, 08/15/2020   Pfizer Covid-19 Vaccine Bivalent Booster 24yrs & up 09/19/2021   Pneumococcal Conjugate-13 11/15/2015   Pneumococcal Polysaccharide-23  08/02/1998, 11/15/2012   Tdap 10/04/2008, 06/21/2018   Unspecified SARS-COV-2 Vaccination 08/21/2022, 06/18/2023   Zoster Recombinant(Shingrix ) 02/09/2017, 04/30/2017   Zoster, Live 08/27/2010    TDAP status: Up to date  Flu Vaccine status: Up to date  Pneumococcal vaccine status: Up to date  Covid-19 vaccine status: Completed vaccines  Qualifies for Shingles Vaccine? Yes   Zostavax completed Yes   Shingrix  Completed?: Yes  Screening Tests Health Maintenance  Topic Date Due   COVID-19 Vaccine (7 - 2024-25 season) 08/13/2023   MAMMOGRAM  08/15/2023   Fecal DNA (Cologuard)  09/25/2024   Medicare Annual Wellness (AWV)  11/30/2024   DTaP/Tdap/Td (4 - Td or Tdap) 06/21/2028   INFLUENZA VACCINE  Completed   DEXA SCAN  Completed   Hepatitis C Screening  Completed   Zoster Vaccines- Shingrix   Completed   HPV VACCINES  Aged Out   Pneumonia Vaccine 37+ Years old  Discontinued   Colonoscopy  Discontinued    Health Maintenance  Health Maintenance Due  Topic Date Due   COVID-19 Vaccine (7 - 2024-25 season) 08/13/2023   MAMMOGRAM  08/15/2023    Colorectal cancer screening: Type of screening: Cologuard. Completed 09/25/2021. Repeat every 3 years  Mammogram status: Completed 2024.  Repeat every year  Bone Density status: Completed 07/19/2019.   Lung Cancer Screening: (Low Dose CT Chest recommended if Age 38-80 years, 20 pack-year currently smoking OR have quit w/in 15years.) does not qualify.   Lung Cancer Screening Referral: no  Additional Screening:  Hepatitis C Screening: does qualify; Completed 10/16/2015  Vision Screening: Recommended annual ophthalmology exams for early detection of glaucoma and other disorders of the eye. Is the patient up to date with their annual eye exam?  Yes  Who is the provider or what is the name of the office in which the patient attends annual eye exams? [facility] If pt is not established with a provider, would they like to be referred to  a provider to establish care? No .   Dental Screening: Recommended annual dental exams for proper oral hygiene  Diabetic Foot Exam: n/a  Community Resource Referral / Chronic Care Management: CRR required this visit?  No   CCM required this visit?  No     Plan:     I have personally reviewed and noted the following in the patient's chart:   Medical and social history Use of alcohol, tobacco or illicit drugs  Current medications and supplements including opioid prescriptions. Patient is not currently taking opioid prescriptions. Functional ability and status Nutritional status Physical activity Advanced directives List of other physicians Hospitalizations, surgeries, and ER visits in previous 12 months Vitals Screenings to include cognitive, depression, and falls Referrals and appointments  In addition, I have reviewed and discussed with patient certain preventive protocols, quality metrics, and best practice recommendations. A written personalized care plan for preventive services as well as general preventive health recommendations were provided to patient.     Ardella FORBES Dawn, LPN   [id number]/[id number]   After Visit Summary: (MyChart) Due to this being a telephonic visit, the after visit summary with patients personalized plan was offered to patient via MyChart   Nurse Notes: none

## 2023-12-09 ENCOUNTER — Other Ambulatory Visit: Payer: Self-pay | Admitting: Family Medicine

## 2023-12-09 DIAGNOSIS — I10 Essential (primary) hypertension: Secondary | ICD-10-CM

## 2024-01-12 ENCOUNTER — Encounter: Payer: Self-pay | Admitting: Internal Medicine

## 2024-01-12 ENCOUNTER — Telehealth: Payer: Self-pay

## 2024-01-12 NOTE — Telephone Encounter (Signed)
 Pt would like to start tirzepatide. Notified pt that Dr. Susann Givens is not in the office. States she is okay to wait to hear back from him. Notified her that Greggory Keen is for diabetes so her insurance will not pay for that. Pt concerned about safety of GLP-1 but is open to Zepbound if you deem safe.

## 2024-01-12 NOTE — Telephone Encounter (Signed)
 Copied from CRM [id number]. Topic: Clinical - Medication Question >> Jan 12, 2024 10:20 AM Franchot Heidelberg wrote: Reason for CRM: Pt called reporting that she and her PCP have discussed wegovy, she says she is interested in starting Tirzepatide however she has questions that she wants to discuss first. No appt available with PCP soon enough, wants to speak to nurse/clinic.   Best contact: [phone]

## 2024-03-23 ENCOUNTER — Ambulatory Visit: Admitting: Family Medicine

## 2024-03-23 VITALS — BP 130/98 | HR 88 | Ht 63.0 in | Wt 152.6 lb

## 2024-03-23 DIAGNOSIS — J302 Other seasonal allergic rhinitis: Secondary | ICD-10-CM

## 2024-03-23 DIAGNOSIS — Z9849 Cataract extraction status, unspecified eye: Secondary | ICD-10-CM

## 2024-03-23 DIAGNOSIS — F341 Dysthymic disorder: Secondary | ICD-10-CM

## 2024-03-23 DIAGNOSIS — I10 Essential (primary) hypertension: Secondary | ICD-10-CM

## 2024-03-23 DIAGNOSIS — Z96653 Presence of artificial knee joint, bilateral: Secondary | ICD-10-CM | POA: Diagnosis not present

## 2024-03-23 DIAGNOSIS — Z1211 Encounter for screening for malignant neoplasm of colon: Secondary | ICD-10-CM

## 2024-03-23 DIAGNOSIS — Z23 Encounter for immunization: Secondary | ICD-10-CM

## 2024-03-23 DIAGNOSIS — E785 Hyperlipidemia, unspecified: Secondary | ICD-10-CM | POA: Diagnosis not present

## 2024-03-23 LAB — LIPID PANEL

## 2024-03-23 MED ORDER — OLMESARTAN MEDOXOMIL-HCTZ 40-12.5 MG PO TABS: 1.0000 | ORAL_TABLET | Freq: Every day | ORAL | 1 refills | Status: DC

## 2024-03-23 MED ORDER — VENLAFAXINE HCL ER 150 MG PO CP24: 150.0000 mg | ORAL_CAPSULE | Freq: Every day | ORAL | 3 refills | Status: DC

## 2024-03-23 MED ORDER — ATORVASTATIN CALCIUM 20 MG PO TABS: 20.0000 mg | ORAL_TABLET | Freq: Every day | ORAL | 3 refills | Status: AC

## 2024-03-23 NOTE — Progress Notes (Addendum)
 Complete physical exam  Patient: Jodi Duffy   DOB: 1951-06-04   72 y.o. Female  MRN: [id number]  Subjective:    Chief Complaint  Patient presents with   Annual Exam    Med check plus.     Jodi Duffy Base is a 73 y.o. female who presents today for a med check She reports consuming a general diet.  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 And try's to walk  She generally feels well. She reports sleeping well.  She has found a website called [facility] and has obtained semaglutide at roughly $200 per month.  She is now started on that within the last few days.  She continues on her olmesartan  as well as atorvastatin .  She also is taking Effexor  and this works quite well for her psychologically.  She gets regular mammograms.  She also sees her ophthalmologist regularly.  She has had cataract surgery and does have glaucoma.  Her allergies seem to be under good control.  She had both knees replaced and seem to be doing well from that.  She is retired and is adjusting to the new lifestyle. Most recent fall risk assessment:    03/23/2024    2:58 PM  Fall Risk   Falls in the past year? 0  Number falls in past yr: 0  Injury with Fall? 0  Risk for fall due to : No Fall Risks  Follow up Falls evaluation completed     Most recent depression screenings:    03/23/2024    2:58 PM 12/01/2023   11:32 AM  PHQ 2/9 Scores  PHQ - 2 Score 0 0    Vision:Within last year and Dental: No current dental problems and Last dental visit: every 6 months    Immunization History  Administered Date(s) Administered   DTaP 08/02/1998   Fluad Quad(high Dose 65+) 08/02/2019, 08/21/2020, 08/21/2022   Influenza Split 08/11/2012   Influenza Whole 09/04/2008, 08/27/2010   Influenza, High Dose Seasonal PF 09/06/2014, 08/07/2018, 08/23/2021   Influenza,inj,Quad PF,6+ Mos 08/08/2013   Influenza-Unspecified 08/17/2014, 08/18/2015, 08/17/2016, 08/17/2017, 08/18/2019   PFIZER(Purple Top)SARS-COV-2 Vaccination 12/07/2019,  12/25/2019, 08/15/2020   PNEUMOCOCCAL CONJUGATE-20 03/23/2024   Pfizer Covid-19 Vaccine Bivalent Booster 50yrs & up 09/19/2021   Pneumococcal Conjugate-13 11/15/2015   Pneumococcal Polysaccharide-23 08/02/1998, 11/15/2012   Tdap 10/04/2008, 06/21/2018   Unspecified SARS-COV-2 Vaccination 08/21/2022, 06/18/2023   Zoster Recombinant(Shingrix ) 02/09/2017, 04/30/2017   Zoster, Live 08/27/2010    Health Maintenance  Topic Date Due   COVID-19 Vaccine (7 - 2024-25 season) 08/13/2023   INFLUENZA VACCINE  06/17/2024   MAMMOGRAM  08/16/2024   Fecal DNA (Cologuard)  09/25/2024   Medicare Annual Wellness (AWV)  11/30/2024   DTaP/Tdap/Td (4 - Td or Tdap) 06/21/2028   DEXA SCAN  Completed   Hepatitis C Screening  Completed   Zoster Vaccines- Shingrix   Completed   HPV VACCINES  Aged Out   Meningococcal B Vaccine  Aged Out   Pneumonia Vaccine 29+ Years old  Discontinued   Colonoscopy  Discontinued    Patient Care Team: Watson Hacking, MD as PCP - General (Family Medicine)   Outpatient Medications Prior to Visit  Medication Sig   cetirizine (ZYRTEC) 10 MG chewable tablet Chew 10 mg by mouth daily as needed.   chlorpheniramine-HYDROcodone (TUSSIONEX) 10-8 MG/5ML Take 5 mLs by mouth every 12 (twelve) hours as needed for cough.   cholecalciferol  (VITAMIN D ) 1000 units tablet Take 1,000 Units by mouth daily.   cyanocobalamin 1000  MCG tablet Take by mouth.   Multiple Vitamins-Minerals (MULTIVITAMIN WITH MINERALS) tablet Take 1 tablet by mouth daily. Women's Multivitamin 50+   mycophenolate (CELLCEPT) 500 MG tablet    naproxen sodium (ALEVE) 220 MG tablet Take 440 mg by mouth every 8 (eight) hours.   Oxymetazoline HCl (UPNEEQ OP) Apply 1 each to eye daily.   [DISCONTINUED] atorvastatin  (LIPITOR) 20 MG tablet Take 1 tablet (20 mg total) by mouth daily.   [DISCONTINUED] olmesartan -hydrochlorothiazide  (BENICAR  HCT) 40-12.5 MG tablet TAKE 1 TABLET BY MOUTH DAILY   [DISCONTINUED] venlafaxine  XR  (EFFEXOR -XR) 150 MG 24 hr capsule Take 1 capsule (150 mg total) by mouth daily with breakfast.   No facility-administered medications prior to visit.    ROS  Family and social history as well as health maintenance and immunizations was reviewed.     Objective:      Physical Exam  Alert and in no distress. Tympanic membranes and canals are normal. Pharyngeal area is normal. Neck is supple without adenopathy or thyromegaly. Cardiac exam shows a regular sinus rhythm without murmurs or gallops. Lungs are clear to auscultation.      Assessment & Plan:     Essential hypertension - Plan: CBC with Differential/Platelet, Comprehensive metabolic panel with GFR, olmesartan -hydrochlorothiazide  (BENICAR  HCT) 40-12.5 MG tablet  Dysthymia - Plan: venlafaxine  XR (EFFEXOR -XR) 150 MG 24 hr capsule  Hyperlipidemia LDL goal <100 - Plan: Lipid panel, atorvastatin  (LIPITOR) 20 MG tablet  Need for vaccination against Streptococcus pneumoniae - Plan: Pneumococcal conjugate vaccine 20-valent (Prevnar 20)  History of cataract surgery, unspecified laterality  Other seasonal allergic rhinitis  Status post total bilateral knee replacement  Screen for colon cancer - Plan: Cologuard  She will continue on her present medication regimen and also start semaglutide.  Discussed possible side effects of the medication and the need to come back here in 3 to 4 months to document how she is doing and also do some basic blood work.  Also time for Cologuard.  Immunizations were updated.  Recommend RSV Return in about 1 year (around 03/23/2025).      Ron Cobbs, MD

## 2024-03-24 ENCOUNTER — Encounter: Payer: Self-pay | Admitting: Family Medicine

## 2024-03-24 LAB — COMPREHENSIVE METABOLIC PANEL WITH GFR
ALT: 23 IU/L (ref 0–32)
AST: 21 IU/L (ref 0–40)
Albumin: 4.5 g/dL (ref 3.8–4.8)
Alkaline Phosphatase: 87 IU/L (ref 44–121)
BUN/Creatinine Ratio: 23 (ref 12–28)
BUN: 19 mg/dL (ref 8–27)
Bilirubin Total: 0.2 mg/dL (ref 0.0–1.2)
CO2: 22 mmol/L (ref 20–29)
Calcium: 10 mg/dL (ref 8.7–10.3)
Chloride: 101 mmol/L (ref 96–106)
Creatinine, Ser: 0.84 mg/dL (ref 0.57–1.00)
Globulin, Total: 2.6 g/dL (ref 1.5–4.5)
Glucose: 106 mg/dL — ABNORMAL HIGH (ref 70–99)
Potassium: 4.3 mmol/L (ref 3.5–5.2)
Sodium: 141 mmol/L (ref 134–144)
Total Protein: 7.1 g/dL (ref 6.0–8.5)
eGFR: 73 mL/min/{1.73_m2} (ref >59)

## 2024-03-24 LAB — CBC WITH DIFFERENTIAL/PLATELET
Basophils Absolute: 0.1 10*3/uL (ref 0.0–0.2)
Basos: 1 %
EOS (ABSOLUTE): 0.2 10*3/uL (ref 0.0–0.4)
Eos: 4 %
Hematocrit: 40.8 % (ref 34.0–46.6)
Hemoglobin: 13.6 g/dL (ref 11.1–15.9)
Immature Grans (Abs): 0 10*3/uL (ref 0.0–0.1)
Immature Granulocytes: 1 %
Lymphocytes Absolute: 1.5 10*3/uL (ref 0.7–3.1)
Lymphs: 25 %
MCH: 30.5 pg (ref 26.6–33.0)
MCHC: 33.3 g/dL (ref 31.5–35.7)
MCV: 92 fL (ref 79–97)
Monocytes Absolute: 0.5 10*3/uL (ref 0.1–0.9)
Monocytes: 8 %
Neutrophils Absolute: 3.8 10*3/uL (ref 1.4–7.0)
Neutrophils: 61 %
Platelets: 265 10*3/uL (ref 150–450)
RBC: 4.46 x10E6/uL (ref 3.77–5.28)
RDW: 13.6 % (ref 11.7–15.4)
WBC: 6.1 10*3/uL (ref 3.4–10.8)

## 2024-03-24 LAB — LIPID PANEL
Cholesterol, Total: 248 mg/dL — ABNORMAL HIGH (ref 100–199)
HDL: 49 mg/dL (ref >39)
LDL CALC COMMENT:: 5.1 ratio — ABNORMAL HIGH (ref 0.0–4.4)
LDL Chol Calc (NIH): 132 mg/dL — ABNORMAL HIGH (ref 0–99)
Triglycerides: 371 mg/dL — ABNORMAL HIGH (ref 0–149)
VLDL Cholesterol Cal: 67 mg/dL — ABNORMAL HIGH (ref 5–40)

## 2024-05-23 ENCOUNTER — Telehealth: Payer: Self-pay

## 2024-05-23 NOTE — Telephone Encounter (Signed)
 Copied from CRM [id number]. Topic: Clinical - Request for Lab/Test Order >> May 23, 2024  3:13 PM Jodi Duffy wrote: Reason for CRM: Patient needs lab order put in for 05/24/24

## 2024-05-24 ENCOUNTER — Other Ambulatory Visit

## 2024-05-24 DIAGNOSIS — E785 Hyperlipidemia, unspecified: Secondary | ICD-10-CM | POA: Diagnosis not present

## 2024-05-24 DIAGNOSIS — I10 Essential (primary) hypertension: Secondary | ICD-10-CM

## 2024-05-24 DIAGNOSIS — R5383 Other fatigue: Secondary | ICD-10-CM | POA: Diagnosis not present

## 2024-05-24 DIAGNOSIS — Z801 Family history of malignant neoplasm of trachea, bronchus and lung: Secondary | ICD-10-CM

## 2024-05-24 LAB — LIPID PANEL

## 2024-05-25 LAB — LIPID PANEL
Cholesterol, Total: 160 mg/dL (ref 100–199)
HDL: 46 mg/dL (ref >39)
LDL CALC COMMENT:: 3.5 ratio (ref 0.0–4.4)
LDL Chol Calc (NIH): 78 mg/dL (ref 0–99)
Triglycerides: 217 mg/dL — AB (ref 0–149)
VLDL Cholesterol Cal: 36 mg/dL (ref 5–40)

## 2024-05-25 LAB — COMPREHENSIVE METABOLIC PANEL WITH GFR
ALT: 24 IU/L (ref 0–32)
AST: 19 IU/L (ref 0–40)
Albumin: 4.1 g/dL (ref 3.8–4.8)
Alkaline Phosphatase: 75 IU/L (ref 44–121)
BUN/Creatinine Ratio: 23 (ref 12–28)
BUN: 15 mg/dL (ref 8–27)
Bilirubin Total: 0.2 mg/dL (ref 0.0–1.2)
CO2: 23 mmol/L (ref 20–29)
Calcium: 9.5 mg/dL (ref 8.7–10.3)
Chloride: 99 mmol/L (ref 96–106)
Creatinine, Ser: 0.64 mg/dL (ref 0.57–1.00)
Globulin, Total: 2.5 g/dL (ref 1.5–4.5)
Glucose: 100 mg/dL — AB (ref 70–99)
Potassium: 4.3 mmol/L (ref 3.5–5.2)
Sodium: 137 mmol/L (ref 134–144)
Total Protein: 6.6 g/dL (ref 6.0–8.5)
eGFR: 92 mL/min/1.73 (ref >59)

## 2024-05-25 LAB — CBC WITH DIFFERENTIAL/PLATELET
Basophils Absolute: 0 x10E3/uL (ref 0.0–0.2)
Basos: 1 %
EOS (ABSOLUTE): 0.2 x10E3/uL (ref 0.0–0.4)
Eos: 3 %
Hematocrit: 41.9 % (ref 34.0–46.6)
Hemoglobin: 13.5 g/dL (ref 11.1–15.9)
Immature Grans (Abs): 0 x10E3/uL (ref 0.0–0.1)
Immature Granulocytes: 0 %
Lymphocytes Absolute: 1.2 x10E3/uL (ref 0.7–3.1)
Lymphs: 26 %
MCH: 31.1 pg (ref 26.6–33.0)
MCHC: 32.2 g/dL (ref 31.5–35.7)
MCV: 97 fL (ref 79–97)
Monocytes Absolute: 0.4 x10E3/uL (ref 0.1–0.9)
Monocytes: 9 %
Neutrophils Absolute: 2.8 x10E3/uL (ref 1.4–7.0)
Neutrophils: 61 %
Platelets: 286 x10E3/uL (ref 150–450)
RBC: 4.34 x10E6/uL (ref 3.77–5.28)
RDW: 13.9 % (ref 11.7–15.4)
WBC: 4.6 x10E3/uL (ref 3.4–10.8)

## 2024-05-31 ENCOUNTER — Other Ambulatory Visit: Payer: Self-pay | Admitting: Family Medicine

## 2024-05-31 ENCOUNTER — Ambulatory Visit: Admitting: Family Medicine

## 2024-05-31 VITALS — HR 88 | Wt 148.4 lb

## 2024-05-31 DIAGNOSIS — Z713 Dietary counseling and surveillance: Secondary | ICD-10-CM

## 2024-05-31 DIAGNOSIS — F341 Dysthymic disorder: Secondary | ICD-10-CM

## 2024-05-31 DIAGNOSIS — E785 Hyperlipidemia, unspecified: Secondary | ICD-10-CM

## 2024-05-31 NOTE — Telephone Encounter (Signed)
 SABRA

## 2024-05-31 NOTE — Progress Notes (Signed)
   Subjective:    Patient ID: Jodi Duffy, female    DOB: 1951-11-01, 73 y.o.   MRN: [id number]  HPI She is here for a follow-up visit.  She has been on a weight loss regimen and is using semaglutide 0.25 going up to 0.5.  Presently she is now taking the 1.5 dosing and this is done through a program called [facility] unfortunately she is having difficulty with constipation, fatigue and nausea.  She has lost approximately 4 pounds.  Her next dose of the medication is supposed to be today.   Review of Systems     Objective:    Physical Exam Alert and in no distress otherwise not examined       Assessment & Plan:  Weight loss counseling, encounter for Discussed the possibility of switching to tirzepatide.  Will give her a sample which will last 1 month at the lowest dose.  Did recommend that she start taking this in about another week.  Recommend she stay off the other 1 at least 2 weeks to make sure its of the system and hopefully some of her symptoms will go away and if so try peers appetite and then we will work on getting hit to her at the cost of hopefully $500 or less.

## 2024-07-04 DIAGNOSIS — H209 Unspecified iridocyclitis: Secondary | ICD-10-CM | POA: Diagnosis not present

## 2024-07-04 DIAGNOSIS — H04221 Epiphora due to insufficient drainage, right lacrimal gland: Secondary | ICD-10-CM | POA: Diagnosis not present

## 2024-07-04 DIAGNOSIS — H40033 Anatomical narrow angle, bilateral: Secondary | ICD-10-CM | POA: Diagnosis not present

## 2024-07-04 DIAGNOSIS — H04123 Dry eye syndrome of bilateral lacrimal glands: Secondary | ICD-10-CM | POA: Diagnosis not present

## 2024-07-04 DIAGNOSIS — G510 Bell's palsy: Secondary | ICD-10-CM | POA: Diagnosis not present

## 2024-07-04 DIAGNOSIS — H1013 Acute atopic conjunctivitis, bilateral: Secondary | ICD-10-CM | POA: Diagnosis not present

## 2024-07-04 DIAGNOSIS — H40053 Ocular hypertension, bilateral: Secondary | ICD-10-CM | POA: Diagnosis not present

## 2024-07-20 ENCOUNTER — Ambulatory Visit: Admitting: Family Medicine

## 2024-07-20 ENCOUNTER — Ambulatory Visit (INDEPENDENT_AMBULATORY_CARE_PROVIDER_SITE_OTHER): Admitting: Podiatry

## 2024-07-20 ENCOUNTER — Encounter: Payer: Self-pay | Admitting: Podiatry

## 2024-07-20 ENCOUNTER — Encounter: Payer: Self-pay | Admitting: Family Medicine

## 2024-07-20 VITALS — BP 130/80 | HR 80 | Temp 99.4°F | Ht 63.0 in | Wt 150.0 lb

## 2024-07-20 DIAGNOSIS — Z6826 Body mass index (BMI) 26.0-26.9, adult: Secondary | ICD-10-CM | POA: Diagnosis not present

## 2024-07-20 DIAGNOSIS — Z23 Encounter for immunization: Secondary | ICD-10-CM

## 2024-07-20 DIAGNOSIS — R350 Frequency of micturition: Secondary | ICD-10-CM

## 2024-07-20 DIAGNOSIS — R8290 Unspecified abnormal findings in urine: Secondary | ICD-10-CM

## 2024-07-20 DIAGNOSIS — L600 Ingrowing nail: Secondary | ICD-10-CM

## 2024-07-20 DIAGNOSIS — N898 Other specified noninflammatory disorders of vagina: Secondary | ICD-10-CM | POA: Diagnosis not present

## 2024-07-20 LAB — POCT URINALYSIS DIP (PROADVANTAGE DEVICE)
Bilirubin, UA: NEGATIVE
Blood, UA: NEGATIVE
Glucose, UA: NEGATIVE mg/dL
Ketones, POC UA: NEGATIVE mg/dL
Nitrite, UA: POSITIVE — AB
Protein Ur, POC: NEGATIVE mg/dL
Specific Gravity, Urine: 1.01
Urobilinogen, Ur: 0.2
pH, UA: 7.5 (ref 5.0–8.0)

## 2024-07-20 NOTE — Addendum Note (Signed)
 Addended by: Zadin Lange on: 07/20/2024 04:46 PM   Modules accepted: Level of Service

## 2024-07-20 NOTE — Progress Notes (Signed)
 Chief Complaint  Patient presents with   Urinary Frequency    Urinary odor and frequency, not every time she urinates. Mentioned to [facility] in July when she was seen. Itching at night sometimes.     Jodi Duffy is a 73 year old female who presents with concerns of abnormal urine odor.  She experiences an abnormal urine odor intermittently throughout the day, occurring daily without a specific pattern or worsening. This has been going on for months.  There is no burning, pain, urgency, or frequency associated with urination, although frequent urination is a long-standing issue. No urinary incontinence or leakage is present.  She reports that her trainer is an EMT--told husband that she had an odor, possible UTI.  She is asking my thoughts on weight loss medications, having used samples of Mounjaro from Dr. Joyce, knowing that her insurance will not cover this.  PMH, PSH, SH reviewed  Outpatient Encounter Medications as of 07/20/2024  Medication Sig Note   atorvastatin  (LIPITOR) 20 MG tablet Take 1 tablet (20 mg total) by mouth daily.    Calcium  Carbonate (CALCIUM  500 PO) Take 1 tablet by mouth daily.    cetirizine (ZYRTEC) 10 MG chewable tablet Chew 10 mg by mouth daily as needed. 07/20/2024: As needed   cholecalciferol  (VITAMIN D ) 1000 units tablet Take 1,000 Units by mouth daily.    cyanocobalamin 1000 MCG tablet Take by mouth.    Multiple Vitamins-Minerals (MULTIVITAMIN WITH MINERALS) tablet Take 1 tablet by mouth daily. Women's Multivitamin 50+    mycophenolate (CELLCEPT) 500 MG tablet     olmesartan -hydrochlorothiazide  (BENICAR  HCT) 40-12.5 MG tablet Take 1 tablet by mouth daily.    Oxymetazoline HCl (UPNEEQ OP) Apply 1 each to eye daily. 04/15/2023: Right eye only   venlafaxine  XR (EFFEXOR -XR) 150 MG 24 hr capsule Take 1 capsule (150 mg total) by mouth daily with breakfast.    naproxen sodium (ALEVE) 220 MG tablet Take 440 mg by mouth every 8 (eight) hours. (Patient not taking:  Reported on 07/20/2024) 07/20/2024: As needed   [DISCONTINUED] chlorpheniramine-HYDROcodone (TUSSIONEX) 10-8 MG/5ML Take 5 mLs by mouth every 12 (twelve) hours as needed for cough.    No facility-administered encounter medications on file as of 07/20/2024.   No Known Allergies  ROS:  She denies fever, chills, nausea, vomiting, diarrhea, or vaginal discharge. Occasional external itching, just at night. No CP, SOB, edema, bleeding, bruising, rash or other concerns, except as noted in HPI   PHYSICAL EXAM:  BP 130/80   Pulse 80   Temp 99.4 F (37.4 C) (Tympanic)   Ht 5' 3 (1.6 m)   Wt 150 lb (68 kg)   BMI 26.57 kg/m    Wt Readings from Last 3 Encounters:  07/20/24 150 lb (68 kg)  05/31/24 148 lb 6.4 oz (67.3 kg)  03/23/24 152 lb 9.6 oz (69.2 kg)   Pleasant, well-appearing female in no distress HEENT: conjunctiva and sclera are clear, EOMI. Neck: no lymphadenopathy or mass Heart: regular rate and rhythm Lungs: clear bilaterally Back: no spinal or CVA tenderness Abdomen: soft, nontender, no mass Extremities: no edema Psych: normal mood, affect, hygiene and grooming Neuro: alert and oriented, cranial nerves grossly intact, normal gait.   Urine: SG 1.010, 1+ leuks, nitrite +, negative blood    ASSESSMENT/PLAN:  Abnormal urinalysis - odor, no other symptoms. Discussed colonization vs infection. Given ongoing odor, if +culture, will treat with ABX - Plan: Urine Culture  Abnormal urine odor - discussed causes--concentrated urine (dehydrated), vitamins, various foods,  etc. She will work on staying well hydrated, try taking vitamins qHS - Plan: POCT Urinalysis DIP (Proadvantage Device), Urine Culture  Urinary frequency - this is more long-standing, not significantly different - Plan: POCT Urinalysis DIP (Proadvantage Device)  Vaginal itching - this is external, only at night, no associated discharge. Ddx reviewed, including atrophic vaginitis, moisture.  Need for influenza  vaccination - Plan: Flu vaccine HIGH DOSE PF(Fluzone Trivalent)  BMI 26.0-26.9,adult - given mounjaro sample by [employer] Discussed rec for more dietary intervention rather than relying on meds alone, esp when not covered/affordable longterm.  She asked about obesity meds at end of visit. Discussed Zepbound via [facility] (in vials rather than pens) as less expensive option, but prefer to ensure that she is working on dietary changes in addition to any medications.  Can return to discuss further if desired.  She insists she will want to see me as PCP when Dr. Joyce lucky, unwilling to see Dr. Vita.  Advised we can cross that bridge when he retires, she is still in good hands with Dr. Joyce right now.  [facility]

## 2024-07-20 NOTE — Patient Instructions (Signed)

## 2024-07-20 NOTE — Patient Instructions (Signed)
 ABNORMAL URINE ODOR AND FREQUENCY OF URINATION: You have been experiencing an abnormal urine odor intermittently throughout the day without any pain, urgency, or other symptoms. Your urinalysis showed signs that may suggest a urinary tract infection (UTI). -We will order a urine culture to check for any infection.  If it is positive for infection, we will send in an antibiotic to see if the odor resolves. -Make sure to stay hydrated and aim for your urine to be a pale color. -Monitor your diet and vitamin intake to see if they are affecting the odor of your urine. -Consider adjusting the timing of your vitamin intake.  We briefly discussed weight loss medications, and the need to also work on proper diet while taking them (or else the weight will come back as soon as the medications are stopped and the hunger/portions increase again).

## 2024-07-21 NOTE — Progress Notes (Signed)
 Subjective:   Patient ID: Jodi Duffy, female   DOB: 73 y.o.   MRN: [id number]   HPI The patient presents stating she has had a painful ingrown toenail of the right big toe and states that it has been going on now for a couple months and she does not remember injury.  States that she has not seen any drainage.  Does not smoke likes to be active   Review of Systems  All other systems reviewed and are negative.       Objective:  Physical Exam Vitals and nursing note reviewed.  Constitutional:      Appearance: She is well-developed.  Pulmonary:     Effort: Pulmonary effort is normal.  Musculoskeletal:        General: Normal range of motion.  Skin:    General: Skin is warm.  Neurological:     Mental Status: She is alert.     Neurovascular status intact muscle strength found to be adequate range of motion adequate with ingrown incurvated medial border of the right big toe that is irritated with no active drainage mild redness but appears to have structural pathology.  Patient is noted to have good digital perfusion well-oriented x 3     Assessment:  Ingrown toenail deformity right hallux medial border with pain     Plan:  H&P reviewed recommended correction of deformity explained procedure risk patient wants surgery and signed consent form understanding risk.  Infiltrated the right big toe 60 mg Xylocaine  Marcaine  mixture sterile prep that using sterile instrumentation remove the medial border exposed matrix applied phenol 3 applications 30 seconds followed by alcohol lavage sterile dressing instructed on keeping the bandage on 24 hours take it off earlier if throbbing were to occur and all questions answered correctly

## 2024-07-25 ENCOUNTER — Ambulatory Visit: Payer: Self-pay | Admitting: Family Medicine

## 2024-07-25 LAB — URINE CULTURE

## 2024-07-25 MED ORDER — NITROFURANTOIN MONOHYD MACRO 100 MG PO CAPS: 100.0000 mg | ORAL_CAPSULE | Freq: Two times a day (BID) | ORAL | 0 refills | Status: AC

## 2024-08-03 ENCOUNTER — Ambulatory Visit: Admitting: Podiatry

## 2024-08-16 ENCOUNTER — Encounter: Payer: Self-pay | Admitting: Family Medicine

## 2024-08-16 ENCOUNTER — Ambulatory Visit: Payer: Self-pay

## 2024-08-16 ENCOUNTER — Ambulatory Visit: Admitting: Family Medicine

## 2024-08-16 VITALS — BP 130/72 | HR 88 | Ht 63.0 in | Wt 148.8 lb

## 2024-08-16 DIAGNOSIS — E66811 Obesity, class 1: Secondary | ICD-10-CM

## 2024-08-16 DIAGNOSIS — R051 Acute cough: Secondary | ICD-10-CM

## 2024-08-16 LAB — POC COVID19 BINAXNOW: SARS Coronavirus 2 Ag: NEGATIVE

## 2024-08-16 MED ORDER — TIRZEPATIDE 2.5 MG/0.5ML ~~LOC~~ SOAJ: 2.5000 mg | SUBCUTANEOUS | 1 refills | Status: DC

## 2024-08-16 MED ORDER — HYDROCOD POLI-CHLORPHE POLI ER 10-8 MG/5ML PO SUER: 5.0000 mL | Freq: Two times a day (BID) | ORAL | 0 refills | Status: DC | PRN

## 2024-08-16 NOTE — Telephone Encounter (Signed)
 FYI Only or Action Required?: FYI only for provider.  Patient was last seen in primary care on 07/20/2024 by Jodi Dawes, MD.  Called Nurse Triage reporting Cough and URI.  Symptoms began yesterday.  Interventions attempted: Rest, hydration, or home remedies.  Symptoms are: unchanged.  Triage Disposition: See Physician Within 24 Hours  Patient/caregiver understands and will follow disposition?: Yes       Copied from CRM #[id number]. Topic: Clinical - Red Word Triage >> Aug 16, 2024 10:03 AM Willma SAUNDERS wrote: [facility] that prompted transfer to Nurse Triage: Patient states she has cold symptoms, runny nose, productive cough for about a week. Reason for Disposition  [1] Continuous (nonstop) coughing interferes with work or school AND [2] no improvement using cough treatment per Care Advice    Pt calling to request Tussonex. Triager advised that OV is usually required before Rx is sent. Patient verbalized understanding and AV was scheduled.  Answer Assessment - Initial Assessment Questions 1. ONSET: When did the cough begin?      X week 2. SEVERITY: How bad is the cough today?      Pt reports she gets similar sx annually and PCP typically prescribes Tussonex with relief of sx. 3. SPUTUM: Describe the color of your sputum (e.g., none, dry cough; clear, white, yellow, green)     clear 4. HEMOPTYSIS: Are you coughing up any blood? If Yes, ask: How much? (e.g., flecks, streaks, tablespoons, etc.)     denies 5. DIFFICULTY BREATHING: Are you having difficulty breathing? If Yes, ask: How bad is it? (e.g., mild, moderate, severe)      denies 6. FEVER: Do you have a fever? If Yes, ask: What is your temperature, how was it measured, and when did it start?     denies 7. CARDIAC HISTORY: Do you have any history of heart disease? (e.g., heart attack, congestive heart failure)      denies 8. LUNG HISTORY: Do you have any history of lung disease?  (e.g., pulmonary embolus,  asthma, emphysema)     denies 9. PE RISK FACTORS: Do you have a history of blood clots? (or: recent major surgery, recent prolonged travel, bedridden)     denies 10. OTHER SYMPTOMS: Do you have any other symptoms? (e.g., runny nose, wheezing, chest pain)       Runny nose -clear. 11. PREGNANCY: Is there any chance you are pregnant? When was your last menstrual period?       N/a 12. TRAVEL: Have you traveled out of the country in the last month? (e.g., travel history, exposures)       N/a  Answer Assessment - Initial Assessment Questions 1. ONSET: When did the nasal discharge start?      X 1 week 2. AMOUNT: How much discharge is there?      clear 3. COUGH: Do you have a cough? If Yes, ask: Describe the color of your mucus. (e.g., clear, white, yellow, green)     clear 4. RESPIRATORY DISTRESS: Describe your breathing.      denies 5. FEVER: Do you have a fever? If Yes, ask: What is your temperature, how was it measured, and when did it start?     denies 6. SEVERITY: Overall, how bad are you feeling right now? (e.g., doesn't interfere with normal activities, staying home from school/work, staying in bed)      *No Answer* 7. OTHER SYMPTOMS: Do you have any other symptoms? (e.g., earache, mouth sores, sore throat, wheezing)     cough  8. PREGNANCY: Is there any chance you are pregnant? When was your last menstrual period?     N/a  Protocols used: Common Cold-A-AH, Cough - Acute Productive-A-AH

## 2024-08-16 NOTE — Progress Notes (Signed)
   Subjective:    Patient ID: Jodi Duffy, female    DOB: 01/19/1951, 73 y.o.   MRN: [id number]  Discussed the use of AI scribe software for clinical note transcription with the patient, who gave verbal consent to proceed.  History of Present Illness   Jodi Duffy is a 73 year old female who presents with a persistent cough following cold symptoms.  About a week ago, she began experiencing cold symptoms, including a runny nose, earache, and sore throat. Shortly thereafter, she developed a cough.  The cough has persisted for a week and is described as a 'deep cough that will not go away.' It is not improving and is disrupting her sleep. She has been taking NyQuil, which provides approximately four hours of sleep.  She reports that this pattern of symptoms occurs annually, where a cold leads to a persistent cough. The cough is described as dry and hacking.   She states that Tussionex usually helps with this. She also was trying to lose weight and did try a website called [facility] however the GLP that they were giving her really caused unacceptable symptoms.  She would like to get Mounjaro from a different site.       Review of Systems     Objective:    Physical Exam Alert and in no distress. Tympanic membranes and canals are normal. Pharyngeal area is normal. Neck is supple without adenopathy or thyromegaly. Cardiac exam shows a regular sinus rhythm without murmurs or gallops. Lungs are clear to auscultation.           Assessment & Plan:  Assessment and Plan    Acute viral upper respiratory infection with acute cough Symptoms consistent with viral etiology. Clear nasal discharge. No evidence of secondary bacterial infection at this time. Expected resolution in 7-10 days. - Continue symptomatic treatment with OTC medications like NyQuil. - Monitor for signs of secondary bacterial infection: increased cough, purulent sputum, fever.   Instructed her to call me on MyChart if  still having symptoms and 3 to 4 days and I will have them call in an antibiotic for her. I will also call in Mounjaro to [facility] to get the ball rolling on helping with her weight loss.  She is aware of the cost.

## 2024-08-16 NOTE — Telephone Encounter (Signed)
Patient coming today

## 2024-08-17 ENCOUNTER — Ambulatory Visit: Admitting: Family Medicine

## 2024-08-19 ENCOUNTER — Encounter: Payer: Self-pay | Admitting: Family Medicine

## 2024-08-19 DIAGNOSIS — J069 Acute upper respiratory infection, unspecified: Secondary | ICD-10-CM

## 2024-08-19 MED ORDER — METHYLPREDNISOLONE 4 MG PO TBPK: ORAL_TABLET | ORAL | 0 refills | Status: DC

## 2024-08-19 NOTE — Telephone Encounter (Signed)
 Called and informed patient that you sent in a steroid.

## 2024-08-22 MED ORDER — AZITHROMYCIN 500 MG PO TABS: 500.0000 mg | ORAL_TABLET | Freq: Every day | ORAL | 0 refills | Status: DC

## 2024-08-25 ENCOUNTER — Ambulatory Visit
Admission: RE | Admit: 2024-08-25 | Discharge: 2024-08-25 | Disposition: A | Discharge status: Home / Self Care | Source: Ambulatory Visit | Attending: Family Medicine | Admitting: Family Medicine

## 2024-08-25 ENCOUNTER — Ambulatory Visit: Admitting: Family Medicine

## 2024-08-25 VITALS — BP 108/68 | HR 58 | Ht 63.0 in | Wt 143.4 lb

## 2024-08-25 DIAGNOSIS — R748 Abnormal levels of other serum enzymes: Secondary | ICD-10-CM | POA: Diagnosis not present

## 2024-08-25 DIAGNOSIS — R051 Acute cough: Secondary | ICD-10-CM | POA: Diagnosis not present

## 2024-08-25 DIAGNOSIS — R059 Cough, unspecified: Secondary | ICD-10-CM | POA: Diagnosis not present

## 2024-08-25 NOTE — Progress Notes (Addendum)
   Subjective:    Patient ID: Jodi Duffy, female    DOB: 07-23-1951, 73 y.o.   MRN: [id number]  Discussed the use of AI scribe software for clinical note transcription with the patient, who gave verbal consent to proceed.  History of Present Illness   Jodi Duffy is a 73 year old female who presents with a persistent cough.  She has been experiencing a persistent cough since September 30th, 2025. Initially, it was thought to be a viral infection, and she treated it symptomatically with Tustionex which did not alleviate the cough. She was then prescribed a steroid, which also did not provide relief.  On October 6th, 2025, she was prescribed azithromycin , taking one tablet daily for three days, but there was no change in her symptoms. No fever, chills, sore throat, or earache are reported, and her primary complaint remains the persistent cough.  She has tried various over-the-counter medications, including  Mucinex DM, with limited success.  She has an upcoming trip planned for Monday.           Review of Systems     Objective:    Physical Exam Physical Exam     Alert and in no distress. Tympanic membranes and canals are normal. Pharyngeal area is normal. Neck is supple without adenopathy or thyromegaly. Cardiac exam shows a regular sinus rhythm without murmurs or gallops. Lungs are clear to auscultation.            Assessment & Plan:  Assessment and Plan    Chronic cough and reactive airway disease Persistent cough unresponsive to [place], steroids, and azithromycin . Differential includes chronic reactive airway disease. Discussed off-label use of Neurontin, amitriptyline, - Order chest x-ray and blood work to rule out pneumonia or other serious conditions. - Prescribe Neurontin for chronic cough management. - Advise Mucinex DM for symptomatic relief.     08/27/2024 the blood work did show elevated liver enzymes however hepatitis panel was negative.  She is still  coughing.  The chest x-ray report has not been read.  I will give her more Tussionex and start her on gabapentin to see if that will help with the cough.  She is to call for an appointment when she comes back from her trip.

## 2024-08-26 ENCOUNTER — Ambulatory Visit: Payer: Self-pay | Admitting: Family Medicine

## 2024-08-26 ENCOUNTER — Other Ambulatory Visit: Payer: Self-pay

## 2024-08-26 DIAGNOSIS — R051 Acute cough: Secondary | ICD-10-CM

## 2024-08-26 LAB — COMPREHENSIVE METABOLIC PANEL WITH GFR
ALT: 165 IU/L — AB (ref 0–32)
AST: 78 IU/L — AB (ref 0–40)
Albumin: 4 g/dL (ref 3.8–4.8)
Alkaline Phosphatase: 173 IU/L — AB (ref 49–135)
BUN/Creatinine Ratio: 24 (ref 12–28)
BUN: 18 mg/dL (ref 8–27)
Bilirubin Total: 0.3 mg/dL (ref 0.0–1.2)
CO2: 23 mmol/L (ref 20–29)
Calcium: 10.2 mg/dL (ref 8.7–10.3)
Chloride: 97 mmol/L (ref 96–106)
Creatinine, Ser: 0.75 mg/dL (ref 0.57–1.00)
Globulin, Total: 3.3 g/dL (ref 1.5–4.5)
Glucose: 99 mg/dL (ref 70–99)
Potassium: 4.2 mmol/L (ref 3.5–5.2)
Sodium: 138 mmol/L (ref 134–144)
Total Protein: 7.3 g/dL (ref 6.0–8.5)
eGFR: 83 mL/min/1.73 (ref >59)

## 2024-08-26 LAB — CBC WITH DIFFERENTIAL/PLATELET
Basophils Absolute: 0.1 x10E3/uL (ref 0.0–0.2)
Basos: 1 %
EOS (ABSOLUTE): 0.1 x10E3/uL (ref 0.0–0.4)
Eos: 2 %
Hematocrit: 40.6 % (ref 34.0–46.6)
Hemoglobin: 13.1 g/dL (ref 11.1–15.9)
Immature Grans (Abs): 0.1 x10E3/uL (ref 0.0–0.1)
Immature Granulocytes: 1 %
Lymphocytes Absolute: 2 x10E3/uL (ref 0.7–3.1)
Lymphs: 25 %
MCH: 30.3 pg (ref 26.6–33.0)
MCHC: 32.3 g/dL (ref 31.5–35.7)
MCV: 94 fL (ref 79–97)
Monocytes Absolute: 0.6 x10E3/uL (ref 0.1–0.9)
Monocytes: 8 %
Neutrophils Absolute: 4.9 x10E3/uL (ref 1.4–7.0)
Neutrophils: 63 %
Platelets: 471 x10E3/uL — ABNORMAL HIGH (ref 150–450)
RBC: 4.32 x10E6/uL (ref 3.77–5.28)
RDW: 12.8 % (ref 11.7–15.4)
WBC: 7.8 x10E3/uL (ref 3.4–10.8)

## 2024-08-27 MED ORDER — HYDROCOD POLI-CHLORPHE POLI ER 10-8 MG/5ML PO SUER: 5.0000 mL | Freq: Two times a day (BID) | ORAL | 0 refills | Status: DC | PRN

## 2024-08-27 MED ORDER — GABAPENTIN 100 MG PO CAPS: 100.0000 mg | ORAL_CAPSULE | Freq: Three times a day (TID) | ORAL | 0 refills | Status: DC | PRN

## 2024-08-30 LAB — HCV INTERPRETATION

## 2024-08-30 LAB — ACUTE VIRAL HEPATITIS (HAV, HBV, HCV)
HCV Ab: NONREACTIVE
Hep A IgM: NEGATIVE
Hep B C IgM: NEGATIVE
Hepatitis B Surface Ag: NEGATIVE

## 2024-08-30 LAB — SPECIMEN STATUS REPORT

## 2024-09-08 ENCOUNTER — Ambulatory Visit (INDEPENDENT_AMBULATORY_CARE_PROVIDER_SITE_OTHER): Admitting: Family Medicine

## 2024-09-08 VITALS — BP 122/72 | HR 86 | Wt 149.8 lb

## 2024-09-08 DIAGNOSIS — R748 Abnormal levels of other serum enzymes: Secondary | ICD-10-CM | POA: Diagnosis not present

## 2024-09-08 DIAGNOSIS — R052 Subacute cough: Secondary | ICD-10-CM | POA: Diagnosis not present

## 2024-09-08 DIAGNOSIS — H6502 Acute serous otitis media, left ear: Secondary | ICD-10-CM

## 2024-09-08 DIAGNOSIS — R945 Abnormal results of liver function studies: Secondary | ICD-10-CM | POA: Diagnosis not present

## 2024-09-08 DIAGNOSIS — R051 Acute cough: Secondary | ICD-10-CM | POA: Diagnosis not present

## 2024-09-08 MED ORDER — AMOXICILLIN-POT CLAVULANATE 875-125 MG PO TABS: 1.0000 | ORAL_TABLET | Freq: Two times a day (BID) | ORAL | 0 refills | Status: DC

## 2024-09-08 MED ORDER — HYDROCOD POLI-CHLORPHE POLI ER 10-8 MG/5ML PO SUER: 5.0000 mL | Freq: Two times a day (BID) | ORAL | 0 refills | Status: DC | PRN

## 2024-09-08 NOTE — Addendum Note (Signed)
 Addended by: VIVIAN LUCIENNE FALCON on: 09/08/2024 02:36 PM   Modules accepted: Orders

## 2024-09-08 NOTE — Progress Notes (Signed)
   Subjective:    Patient ID: Jodi Duffy, female    DOB: 10/24/1951, 73 y.o.   MRN: [id number]  Discussed the use of AI scribe software for clinical note transcription with the patient, who gave verbal consent to proceed.  History of Present Illness   Jodi Duffy is a 73 year old female who presents with a persistent cough since the end of September.  She has been experiencing a persistent cough for approximately five weeks. Initially, she used over-the-counter medications, which usually provides relief but has been ineffective this time. She was subsequently prescribed Tussionex, azithromycin , and gabapentin, with limited relief. Gabapentin, taken twice daily, provided some improvement but did not fully resolve the cough. Despite these interventions, her cough persists, causing difficulty sleeping.  She received a steroid injection and underwent a chest x-ray, which was reported as clean. Blood work revealed elevated liver enzymes.  She uses Tussionex primarily at night to help with sleep.  No ear pain reported. No decreased hearing reported.  She recently returned from a cruise, which she described as enjoyable but exhausting, likely due to her ongoing symptoms.           Review of Systems     Objective:    Physical Exam Alert and in no distress. Tympanic membrane on the right is normal, left is dull and vascular,  canals are normal. Pharyngeal area is normal. Neck is supple without adenopathy or thyromegaly. Cardiac exam shows a regular sinus rhythm without murmurs or gallops. Lungs are clear to auscultation.        Assessment & Plan:  Assessment and Plan    Chronic cough Persistent cough unresponsive to multiple treatments. Chest x-ray clear. Elevated liver enzymes noted. - Refer to pulmonology for further evaluation. - Prescribe Tussionex for nighttime use.  Acute left otitis media New left ear infection with eardrum changes. No hearing loss or pain. Recent  penicillin exposure without reaction  - Prescribe Augmentin . - Monitor for allergic reaction.  Elevated liver enzymes Elevated liver enzymes on recent blood work. - Order repeat blood work to monitor liver enzymes. - Order hepatitis panel.

## 2024-09-09 ENCOUNTER — Ambulatory Visit: Payer: Self-pay | Admitting: Family Medicine

## 2024-09-09 LAB — CBC WITH DIFFERENTIAL/PLATELET
Basophils Absolute: 0 x10E3/uL (ref 0.0–0.2)
Basos: 1 %
EOS (ABSOLUTE): 0.2 x10E3/uL (ref 0.0–0.4)
Eos: 3 %
Hematocrit: 34.9 % (ref 34.0–46.6)
Hemoglobin: 11.6 g/dL (ref 11.1–15.9)
Immature Grans (Abs): 0 x10E3/uL (ref 0.0–0.1)
Immature Granulocytes: 0 %
Lymphocytes Absolute: 1.3 x10E3/uL (ref 0.7–3.1)
Lymphs: 18 %
MCH: 31.1 pg (ref 26.6–33.0)
MCHC: 33.2 g/dL (ref 31.5–35.7)
MCV: 94 fL (ref 79–97)
Monocytes Absolute: 0.6 x10E3/uL (ref 0.1–0.9)
Monocytes: 9 %
Neutrophils Absolute: 4.7 x10E3/uL (ref 1.4–7.0)
Neutrophils: 69 %
Platelets: 303 x10E3/uL (ref 150–450)
RBC: 3.73 x10E6/uL — ABNORMAL LOW (ref 3.77–5.28)
RDW: 13.2 % (ref 11.7–15.4)
WBC: 6.8 x10E3/uL (ref 3.4–10.8)

## 2024-09-09 LAB — COMPREHENSIVE METABOLIC PANEL WITH GFR
ALT: 153 IU/L — ABNORMAL HIGH (ref 0–32)
AST: 72 IU/L — ABNORMAL HIGH (ref 0–40)
Albumin: 3.9 g/dL (ref 3.8–4.8)
Alkaline Phosphatase: 150 IU/L — ABNORMAL HIGH (ref 49–135)
BUN/Creatinine Ratio: 19 (ref 12–28)
BUN: 13 mg/dL (ref 8–27)
Bilirubin Total: 0.3 mg/dL (ref 0.0–1.2)
CO2: 24 mmol/L (ref 20–29)
Calcium: 9.2 mg/dL (ref 8.7–10.3)
Chloride: 99 mmol/L (ref 96–106)
Creatinine, Ser: 0.68 mg/dL (ref 0.57–1.00)
Globulin, Total: 2.5 g/dL (ref 1.5–4.5)
Glucose: 100 mg/dL — ABNORMAL HIGH (ref 70–99)
Potassium: 4.2 mmol/L (ref 3.5–5.2)
Sodium: 138 mmol/L (ref 134–144)
Total Protein: 6.4 g/dL (ref 6.0–8.5)
eGFR: 91 mL/min/1.73 (ref >59)

## 2024-09-09 LAB — ACUTE VIRAL HEPATITIS (HAV, HBV, HCV)
HCV Ab: NONREACTIVE
Hep A IgM: NEGATIVE
Hep B C IgM: NEGATIVE
Hepatitis B Surface Ag: NEGATIVE

## 2024-09-09 LAB — HCV INTERPRETATION

## 2024-09-09 NOTE — Addendum Note (Signed)
 Addended by: JOYCE NORLEEN BROCKS on: 09/09/2024 06:56 AM   Modules accepted: Orders

## 2024-09-12 NOTE — Progress Notes (Signed)
 Called Patient, unable to leave voicemail.

## 2024-09-13 DIAGNOSIS — Z1231 Encounter for screening mammogram for malignant neoplasm of breast: Secondary | ICD-10-CM | POA: Diagnosis not present

## 2024-09-15 ENCOUNTER — Telehealth: Payer: Self-pay | Admitting: Family Medicine

## 2024-09-15 NOTE — Telephone Encounter (Signed)
 Left another message for pt to see if she wants to be switched to Zepbound & this sent to [facility], as Mounjaro does not have a cash pay program like Zepbound

## 2024-09-16 ENCOUNTER — Ambulatory Visit
Admission: RE | Admit: 2024-09-16 | Discharge: 2024-09-16 | Disposition: A | Discharge status: Home / Self Care | Source: Ambulatory Visit | Attending: Family Medicine | Admitting: Family Medicine

## 2024-09-16 ENCOUNTER — Other Ambulatory Visit

## 2024-09-16 DIAGNOSIS — R748 Abnormal levels of other serum enzymes: Secondary | ICD-10-CM

## 2024-09-16 DIAGNOSIS — R945 Abnormal results of liver function studies: Secondary | ICD-10-CM | POA: Diagnosis not present

## 2024-09-17 DIAGNOSIS — R945 Abnormal results of liver function studies: Secondary | ICD-10-CM | POA: Diagnosis not present

## 2024-09-19 ENCOUNTER — Other Ambulatory Visit: Payer: Self-pay

## 2024-09-19 ENCOUNTER — Other Ambulatory Visit

## 2024-09-19 DIAGNOSIS — R748 Abnormal levels of other serum enzymes: Secondary | ICD-10-CM | POA: Diagnosis not present

## 2024-09-19 NOTE — Progress Notes (Signed)
Scheduled her for today

## 2024-09-20 ENCOUNTER — Other Ambulatory Visit

## 2024-09-20 ENCOUNTER — Ambulatory Visit: Payer: Self-pay | Admitting: Family Medicine

## 2024-09-20 LAB — COMPREHENSIVE METABOLIC PANEL WITH GFR
ALT: 39 IU/L — ABNORMAL HIGH (ref 0–32)
AST: 25 IU/L (ref 0–40)
Albumin: 4 g/dL (ref 3.8–4.8)
Alkaline Phosphatase: 99 IU/L (ref 49–135)
BUN/Creatinine Ratio: 23 (ref 12–28)
BUN: 16 mg/dL (ref 8–27)
Bilirubin Total: 0.2 mg/dL (ref 0.0–1.2)
CO2: 23 mmol/L (ref 20–29)
Calcium: 9.5 mg/dL (ref 8.7–10.3)
Chloride: 101 mmol/L (ref 96–106)
Creatinine, Ser: 0.71 mg/dL (ref 0.57–1.00)
Globulin, Total: 2.6 g/dL (ref 1.5–4.5)
Glucose: 119 mg/dL — ABNORMAL HIGH (ref 70–99)
Potassium: 4.2 mmol/L (ref 3.5–5.2)
Sodium: 139 mmol/L (ref 134–144)
Total Protein: 6.6 g/dL (ref 6.0–8.5)
eGFR: 89 mL/min/1.73 (ref >59)

## 2024-09-26 ENCOUNTER — Ambulatory Visit

## 2024-09-26 VITALS — BP 116/70 | HR 94 | Temp 97.8°F | Ht 63.0 in | Wt 145.4 lb

## 2024-09-26 DIAGNOSIS — R053 Chronic cough: Secondary | ICD-10-CM | POA: Diagnosis not present

## 2024-09-26 MED ORDER — ALBUTEROL SULFATE HFA 108 (90 BASE) MCG/ACT IN AERS: 2.0000 | INHALATION_SPRAY | Freq: Four times a day (QID) | RESPIRATORY_TRACT | 4 refills | Status: AC | PRN

## 2024-09-26 MED ORDER — PREDNISONE 10 MG PO TABS: ORAL_TABLET | ORAL | 0 refills | Status: AC

## 2024-09-26 NOTE — Patient Instructions (Addendum)
 Dear Ms. Brenneman,   You have chronic cough, and I will treated with the following: -albuterol inhaler 1 puff, 3 times a day for 3 weeks, if this improves the cough, send me a message to send you a different long term inhaler.  -Take prednisone for 12 days:   40 mg for 4 days  30 mg for 4 days  20 mg for 2 days  10 mg for 2 days -I will see you in 6 weeks.  INHALER INSTRUCTIONS: To use the inhaler you follow these steps: Prime the inhaler according to instructions (which means waste between 1-4 doses before next use).  Follow package insert Shake the inhaler before each use Exhale completely by blowing all the air out of your lungs Seal your mouth around the inhaler Press down on the canister then inhale SLOW and STEADY until your fill your lungs completely. Hold the breath for 6-10 seconds Gently exhale Wait 60 seconds then repeat steps 2-8.  Your pharmacist can also review proper technique if you have any remaining questions. Let me know if the cost is too high, your insurance may be able to recommend a more affordable option for you.

## 2024-09-26 NOTE — Progress Notes (Signed)
 Synopsis: Referred for Chronic cough by Joyce Norleen BROCKS, MD  Subjective:   PATIENT ID: Jodi Duffy GENDER: female DOB: 24-Feb-1951, MRN: [id number]  Chief Complaint  Patient presents with   Cough    Pt states Dry cough started about 8 week ago      Discussed the use of AI scribe software for clinical note transcription with the patient, who gave verbal consent to proceed. History of Present Illness Jodi Duffy is a 73 year old female with PMHX of panuveitis (unsure if she is taking cellcept) who presents with an exacerbation of her chronic cough.  She experiences a persistent dry cough that has worsened over the past eight weeks, with increased severity at night and in the morning. The cough disrupts her sleep, requiring water at her bedside. Previous episodes occurred annually, with Tussionex providing relief in the past, but it is ineffective this time. Gabapentin and a Medrol  pack have not alleviated symptoms, and antibiotics were also ineffective.  There are no recent viral infections. She experiences no significant postnasal drip, sinus congestion, attributing rare sinus issues to weather changes, which are relieved by Zyrtec. She has not used inhalers before and does not experience significant acid reflux.  Patient has tried multiple therapies with PCP as tussionex, steroids, azithromycin , neurontin, without significant resolution of her symptoms.  Past Medical History:  Diagnosis Date   Allergy    RHINITIS   Arthritis    COVID-19    Depression    Family history of cancer of female genital organ    Family history of lung cancer    Family history of throat cancer    Glaucoma    Hyperlipidemia    Hypertension    Osteopenia    TMJ syndrome      Family History  Problem Relation Age of Onset   Heart disease Mother        valve replacement   Breast cancer Mother 50   Throat cancer Mother 67       smoker   Heart disease Father        died of brain embolism  after hip surgery   Arthritis Father    Arthritis Brother    Breast cancer Maternal Aunt        dx in her 62s   Lung cancer Maternal Aunt        dx in her 50s/60s, non-smoker   Vaginal cancer Maternal Aunt        dx in her 75s   Breast cancer Other        dx in her 55s, maternal first cousin's daughter   Diabetes Neg Hx    Hypertension Neg Hx    Stroke Neg Hx    Colon cancer Neg Hx      Past Surgical History:  Procedure Laterality Date   BIOPSY BREAST     BLEPHAROPLASTY     KNEE ARTHROSCOPY     right knee, 1990 and 2012, Dr. Anderson [facility] JOINT SURGERY     TONSILLECTOMY     TOTAL KNEE ARTHROPLASTY  08/10/2012   Procedure: TOTAL KNEE ARTHROPLASTY;  Surgeon: Maude LELON Anderson, MD;  Location: [facility] OR;  Service: Orthopedics;  Laterality: Right;  Right Total Knee Arthroplasty   TOTAL KNEE ARTHROPLASTY Left 06/14/2019   Procedure: LEFT TOTAL KNEE ARTHROPLASTY;  Surgeon: Anderson Maude LELON, MD;  Location: [facility];  Service: Orthopedics;  Laterality: Left;    Social History   Socioeconomic History   Marital  status: Married    Spouse name: Not on file   Number of children: Not on file   Years of education: Not on file   Highest education level: Professional school degree (e.g., MD, DDS, DVM, JD)  Occupational History   Occupation: attorney    Employer: [employer]  Tobacco Use   Smoking status: Former    Current packs/day: 0.00    Average packs/day: 1 pack/day for 12.0 years (12.0 ttl pk-yrs)    Types: Cigarettes    Start date: 02/10/1968    Quit date: 02/10/1980    Years since quitting: 44.6   Smokeless tobacco: Never  Vaping Use   Vaping status: Never Used  Substance and Sexual Activity   Alcohol use: Yes    Alcohol/week: 2.0 standard drinks of alcohol    Types: 2 Glasses of wine per week    Comment: daily   Drug use: No   Sexual activity: Yes  Other Topics Concern   Not on file  Social History Narrative   Exercises with pilates, golf, and  has a trainer   Social Drivers of Health   Financial Resource Strain: Low Risk  (05/31/2024)   Overall Financial Resource Strain (CARDIA)    Difficulty of Paying Living Expenses: Not hard at all  Food Insecurity: No Food Insecurity (05/31/2024)   Hunger Vital Sign    Worried About Running Out of Food in the Last Year: Never true    Ran Out of Food in the Last Year: Never true  Transportation Needs: No Transportation Needs (05/31/2024)   PRAPARE - Administrator, Civil Service (Medical): No    Lack of Transportation (Non-Medical): No  Physical Activity: Insufficiently Active (05/31/2024)   Exercise Vital Sign    Days of Exercise per Week: 2 days    Minutes of Exercise per Session: 40 min  Stress: No Stress Concern Present (05/31/2024)   [employer] of Occupational Health - Occupational Stress Questionnaire    Feeling of Stress: Not at all  Social Connections: Socially Integrated (05/31/2024)   Social Connection and Isolation Panel    Frequency of Communication with Friends and Family: Twice a week    Frequency of Social Gatherings with Friends and Family: Twice a week    Attends Religious Services: More than 4 times per year    Active Member of [employer] or Organizations: Yes    Attends Banker Meetings: More than 4 times per year    Marital Status: Married  Catering Manager Violence: Not At Risk (12/01/2023)   Humiliation, Afraid, Rape, and Kick questionnaire    Fear of Current or Ex-Partner: No    Emotionally Abused: No    Physically Abused: No    Sexually Abused: No    She is an pensions consultant. She smoked 10 years 1ppd 40-50 years ago. No MJ, vaping.    No Known Allergies   Outpatient Medications Prior to Visit  Medication Sig Dispense Refill   amoxicillin -clavulanate (AUGMENTIN ) 875-125 MG tablet Take 1 tablet by mouth 2 (two) times daily. 20 tablet 0   atorvastatin  (LIPITOR) 20 MG tablet Take 1 tablet (20 mg total) by mouth daily. 90 tablet 3    azithromycin  (ZITHROMAX ) 500 MG tablet Take 1 tablet (500 mg total) by mouth daily. (Patient not taking: Reported on 09/08/2024) 3 tablet 0   Calcium  Carbonate (CALCIUM  500 PO) Take 1 tablet by mouth daily.     cetirizine (ZYRTEC) 10 MG chewable tablet Chew 10 mg by mouth  daily as needed.     chlorpheniramine-HYDROcodone (TUSSIONEX) 10-8 MG/5ML Take 5 mLs by mouth every 12 (twelve) hours as needed for cough. 115 mL 0   cholecalciferol  (VITAMIN D ) 1000 units tablet Take 1,000 Units by mouth daily.     cyanocobalamin 1000 MCG tablet Take by mouth.     gabapentin (NEURONTIN) 100 MG capsule Take 1 capsule (100 mg total) by mouth 3 (three) times daily as needed (for cough). 40 capsule 0   methylPREDNISolone  (MEDROL  DOSEPAK) 4 MG TBPK tablet Take as directed. (Patient not taking: Reported on 09/08/2024) 21 tablet 0   Multiple Vitamins-Minerals (MULTIVITAMIN WITH MINERALS) tablet Take 1 tablet by mouth daily. Women's Multivitamin 50+     mycophenolate (CELLCEPT) 500 MG tablet      naproxen sodium (ALEVE) 220 MG tablet Take 440 mg by mouth every 8 (eight) hours.     olmesartan -hydrochlorothiazide  (BENICAR  HCT) 40-12.5 MG tablet Take 1 tablet by mouth daily. 90 tablet 1   Oxymetazoline HCl (UPNEEQ OP) Apply 1 each to eye daily.     tirzepatide (MOUNJARO) 2.5 MG/0.5ML Pen Inject 2.5 mg into the skin once a week. 2 mL 1   venlafaxine  XR (EFFEXOR -XR) 150 MG 24 hr capsule Take 1 capsule (150 mg total) by mouth daily with breakfast. 90 capsule 3   No facility-administered medications prior to visit.     Objective:   Physical Exam:  General appearance: 73 y.o., female, NAD, conversant  Eyes: anicteric sclerae; PERRL, tracking appropriately HENT: NCAT; MMM Lungs: CTAB, no crackles, no wheeze, with normal respiratory effort CV: RRR, no murmur  Abdomen: Soft, non-tender; non-distended, BS present  Extremities: No peripheral edema, warm Skin: Normal turgor and texture; no rash Psych: Appropriate  affect Neuro: Alert and oriented to person and place, no focal deficit    Vitals:   09/26/24 0945  BP: 116/70  Pulse: 94  Temp: 97.8 F (36.6 C)  TempSrc: Oral  SpO2: 95%  Weight: 145 lb 6.4 oz (66 kg)  Height: 5' 3 (1.6 m)     BMI Readings from Last 3 Encounters:  09/08/24 26.54 kg/m  08/25/24 25.40 kg/m  08/16/24 26.36 kg/m   Wt Readings from Last 3 Encounters:  09/08/24 149 lb 12.8 oz (67.9 kg)  08/25/24 143 lb 6.4 oz (65 kg)  08/16/24 148 lb 12.8 oz (67.5 kg)    CBC    Component Value Date/Time   WBC 6.8 09/08/2024 1411   WBC 7.8 06/16/2019 0524   RBC 3.73 (L) 09/08/2024 1411   RBC 2.73 (L) 06/16/2019 0524   HGB 11.6 09/08/2024 1411   HCT 34.9 09/08/2024 1411   PLT 303 09/08/2024 1411   MCV 94 09/08/2024 1411   MCH 31.1 09/08/2024 1411   MCH 30.0 06/16/2019 0524   MCHC 33.2 09/08/2024 1411   MCHC 32.4 06/16/2019 0524   RDW 13.2 09/08/2024 1411   LYMPHSABS 1.3 09/08/2024 1411   MONOABS 0.4 06/13/2019 1022   EOSABS 0.2 09/08/2024 1411   BASOSABS 0.0 09/08/2024 1411    Chest Imaging: CXR 05/22/22 reviewed by me unremarkable CXR 08/28/2024 no acute symptoms   Pulmonary Functions Testing Results:    Latest Ref Rng & Units 10/01/2022   11:45 AM  PFT Results  FVC-Pre L 2.19   FVC-Predicted Pre % 79   FVC-Post L 2.30   FVC-Predicted Post % 83   Pre FEV1/FVC % % 82   Post FEV1/FCV % % 80   FEV1-Pre L 1.79   FEV1-Predicted Pre % 85  FEV1-Post L 1.84   DLCO uncorrected ml/min/mmHg 16.18   DLCO UNC% % 86   DLCO corrected ml/min/mmHg 16.18   DLCO COR %Predicted % 86   DLVA Predicted % 116   TLC L 4.51   TLC % Predicted % 92   RV % Predicted % 105    PFTs 10/01/2022: no restriction, no obstruction, normal DLCO. No response to BD   Assessment & Plan:   Assessment & Plan Chronic cough Persisting for eight weeks, worse at night, morning, and evening. Dry cough with minimal mucus. Previous treatments ineffective. Differential diagnosis includes  asthma, vocal cord dysfunction. Targeting asthma with albuterol inhaler and prednisone.  - Prescribed prednisone: 40 mg for 4 days, 30 mg for 4 days, 20 mg for 2 days, 10 mg for 2 days. - Prescribed albuterol inhaler: use three times a day for three to four weeks. - Provided instructions for inhaler use. - If albuterol is effective, will consider prescribing a long-term inhaler. - If no improvement, will consider referral to ENT for evaluation of vocal cord dysfunction.    Marny Patch, MD  [facility] 09/26/2024 6:53 AM

## 2024-11-15 ENCOUNTER — Ambulatory Visit

## 2024-11-15 DIAGNOSIS — R053 Chronic cough: Secondary | ICD-10-CM

## 2024-11-17 LAB — COLOGUARD: COLOGUARD: NEGATIVE

## 2024-11-18 ENCOUNTER — Ambulatory Visit: Payer: Self-pay | Admitting: Family Medicine

## 2024-11-22 ENCOUNTER — Ambulatory Visit: Admitting: Family Medicine

## 2024-11-22 VITALS — BP 122/80 | HR 92 | Wt 149.0 lb

## 2024-11-22 DIAGNOSIS — F341 Dysthymic disorder: Secondary | ICD-10-CM | POA: Diagnosis not present

## 2024-11-22 DIAGNOSIS — Z23 Encounter for immunization: Secondary | ICD-10-CM

## 2024-11-22 MED ORDER — VENLAFAXINE HCL 100 MG PO TABS: 100.0000 mg | ORAL_TABLET | Freq: Two times a day (BID) | ORAL | 1 refills | Status: AC

## 2024-11-22 NOTE — Progress Notes (Signed)
" ° °  Subjective:    Patient ID: Jodi Duffy, female    DOB: 1951/07/29, 74 y.o.   MRN: [id number]  HPI She is here for consult concerning depression.  She feels as if she has been depressed for quite some time citing at least for the last several years.  She does have an underlying history of dysthymia and has been on expects her 75 mg prior to this.  She also admits to drinking 2 glasses of wine per night and is concerned over that.  She retired a year and a half ago and thinks that might be playing a role in this as well.  She does note that her husband has noted psychological changes.  She does exercise fairly regularly.  She does state that she has researched into potentially getting involved in counseling.   Review of Systems     Objective:    Physical Exam Alert and in no distress with a slightly flat affect.  PHQ score of 5.       Assessment & Plan:  Dysthymia - Plan: venlafaxine  (EFFEXOR ) 100 MG tablet  COVID-19 vaccine administered - Plan: Pfizer Comirnaty Covid-19 Vaccine 47yrs & older I will increase her Effexor  to 200 mg/day.  Encouraged her to call [facility] to get involved in counseling.  Encouraged her to continue with exercise and stop all alcohol consumption explaining that this is a depressant.  She also talked about going on a trip to a warmer climate and I strongly encouraged her to do this explaining that when she gets involved in counseling this can be done virtually.  Recheck here in 1 month. "

## 2024-12-10 ENCOUNTER — Other Ambulatory Visit: Payer: Self-pay | Admitting: Family Medicine

## 2024-12-10 DIAGNOSIS — I10 Essential (primary) hypertension: Secondary | ICD-10-CM

## 2024-12-11 NOTE — Telephone Encounter (Signed)
 Has an appt in February with Dr. Joyce

## 2024-12-27 ENCOUNTER — Ambulatory Visit: Admitting: Family Medicine

## 2025-03-28 ENCOUNTER — Ambulatory Visit: Payer: Self-pay | Admitting: Family Medicine
